# Patient Record
Sex: Male | Born: 1945 | Smoking: Current every day smoker
Health system: Southern US, Community
[De-identification: ages and names within clinical notes are randomized; demographics above are authoritative.]

---

## 2017-01-10 ENCOUNTER — Inpatient Hospital Stay (HOSPITAL_COMMUNITY): Payer: Medicare Other

## 2017-01-10 ENCOUNTER — Emergency Department (HOSPITAL_COMMUNITY): Payer: Medicare Other

## 2017-01-10 ENCOUNTER — Inpatient Hospital Stay (HOSPITAL_COMMUNITY)
Admission: EM | Admit: 2017-01-10 | Discharge: 2017-02-19 | DRG: 853 | Disposition: E | Payer: Medicare Other | Attending: Family Medicine | Admitting: Family Medicine

## 2017-01-10 DIAGNOSIS — E860 Dehydration: Secondary | ICD-10-CM | POA: Diagnosis present

## 2017-01-10 DIAGNOSIS — R042 Hemoptysis: Secondary | ICD-10-CM | POA: Diagnosis not present

## 2017-01-10 DIAGNOSIS — J939 Pneumothorax, unspecified: Secondary | ICD-10-CM

## 2017-01-10 DIAGNOSIS — R29810 Facial weakness: Secondary | ICD-10-CM | POA: Diagnosis present

## 2017-01-10 DIAGNOSIS — G8191 Hemiplegia, unspecified affecting right dominant side: Secondary | ICD-10-CM | POA: Diagnosis present

## 2017-01-10 DIAGNOSIS — Z4682 Encounter for fitting and adjustment of non-vascular catheter: Secondary | ICD-10-CM

## 2017-01-10 DIAGNOSIS — R54 Age-related physical debility: Secondary | ICD-10-CM | POA: Diagnosis not present

## 2017-01-10 DIAGNOSIS — R402234 Coma scale, best verbal response, inappropriate words, 24 hours or more after hospital admission: Secondary | ICD-10-CM

## 2017-01-10 DIAGNOSIS — L89152 Pressure ulcer of sacral region, stage 2: Secondary | ICD-10-CM | POA: Diagnosis not present

## 2017-01-10 DIAGNOSIS — I21A1 Myocardial infarction type 2: Secondary | ICD-10-CM | POA: Diagnosis not present

## 2017-01-10 DIAGNOSIS — R64 Cachexia: Secondary | ICD-10-CM | POA: Diagnosis present

## 2017-01-10 DIAGNOSIS — Z87820 Personal history of traumatic brain injury: Secondary | ICD-10-CM

## 2017-01-10 DIAGNOSIS — R402114 Coma scale, eyes open, never, 24 hours or more after hospital admission: Secondary | ICD-10-CM | POA: Diagnosis not present

## 2017-01-10 DIAGNOSIS — R402362 Coma scale, best motor response, obeys commands, at arrival to emergency department: Secondary | ICD-10-CM | POA: Diagnosis present

## 2017-01-10 DIAGNOSIS — Z681 Body mass index (BMI) 19 or less, adult: Secondary | ICD-10-CM

## 2017-01-10 DIAGNOSIS — J189 Pneumonia, unspecified organism: Secondary | ICD-10-CM | POA: Diagnosis not present

## 2017-01-10 DIAGNOSIS — R4701 Aphasia: Secondary | ICD-10-CM

## 2017-01-10 DIAGNOSIS — Z9689 Presence of other specified functional implants: Secondary | ICD-10-CM | POA: Diagnosis not present

## 2017-01-10 DIAGNOSIS — J942 Hemothorax: Secondary | ICD-10-CM | POA: Diagnosis present

## 2017-01-10 DIAGNOSIS — Z23 Encounter for immunization: Secondary | ICD-10-CM

## 2017-01-10 DIAGNOSIS — J90 Pleural effusion, not elsewhere classified: Secondary | ICD-10-CM

## 2017-01-10 DIAGNOSIS — I519 Heart disease, unspecified: Secondary | ICD-10-CM | POA: Diagnosis not present

## 2017-01-10 DIAGNOSIS — G92 Toxic encephalopathy: Secondary | ICD-10-CM | POA: Diagnosis not present

## 2017-01-10 DIAGNOSIS — I130 Hypertensive heart and chronic kidney disease with heart failure and stage 1 through stage 4 chronic kidney disease, or unspecified chronic kidney disease: Secondary | ICD-10-CM | POA: Diagnosis present

## 2017-01-10 DIAGNOSIS — D6489 Other specified anemias: Secondary | ICD-10-CM | POA: Diagnosis present

## 2017-01-10 DIAGNOSIS — R402142 Coma scale, eyes open, spontaneous, at arrival to emergency department: Secondary | ICD-10-CM | POA: Diagnosis present

## 2017-01-10 DIAGNOSIS — E872 Acidosis: Secondary | ICD-10-CM | POA: Diagnosis present

## 2017-01-10 DIAGNOSIS — Z7189 Other specified counseling: Secondary | ICD-10-CM | POA: Diagnosis not present

## 2017-01-10 DIAGNOSIS — L0291 Cutaneous abscess, unspecified: Secondary | ICD-10-CM | POA: Diagnosis not present

## 2017-01-10 DIAGNOSIS — K047 Periapical abscess without sinus: Secondary | ICD-10-CM

## 2017-01-10 DIAGNOSIS — R402344 Coma scale, best motor response, flexion withdrawal, 24 hours or more after hospital admission: Secondary | ICD-10-CM | POA: Diagnosis not present

## 2017-01-10 DIAGNOSIS — N179 Acute kidney failure, unspecified: Secondary | ICD-10-CM | POA: Diagnosis present

## 2017-01-10 DIAGNOSIS — E870 Hyperosmolality and hypernatremia: Secondary | ICD-10-CM | POA: Diagnosis present

## 2017-01-10 DIAGNOSIS — I639 Cerebral infarction, unspecified: Secondary | ICD-10-CM | POA: Diagnosis not present

## 2017-01-10 DIAGNOSIS — R188 Other ascites: Secondary | ICD-10-CM | POA: Diagnosis not present

## 2017-01-10 DIAGNOSIS — I5032 Chronic diastolic (congestive) heart failure: Secondary | ICD-10-CM | POA: Diagnosis present

## 2017-01-10 DIAGNOSIS — E876 Hypokalemia: Secondary | ICD-10-CM | POA: Diagnosis not present

## 2017-01-10 DIAGNOSIS — R29710 NIHSS score 10: Secondary | ICD-10-CM | POA: Diagnosis present

## 2017-01-10 DIAGNOSIS — R4182 Altered mental status, unspecified: Secondary | ICD-10-CM | POA: Diagnosis present

## 2017-01-10 DIAGNOSIS — R1310 Dysphagia, unspecified: Secondary | ICD-10-CM | POA: Diagnosis not present

## 2017-01-10 DIAGNOSIS — G9341 Metabolic encephalopathy: Secondary | ICD-10-CM | POA: Diagnosis present

## 2017-01-10 DIAGNOSIS — K045 Chronic apical periodontitis: Secondary | ICD-10-CM | POA: Diagnosis present

## 2017-01-10 DIAGNOSIS — IMO0002 Reserved for concepts with insufficient information to code with codable children: Secondary | ICD-10-CM

## 2017-01-10 DIAGNOSIS — E878 Other disorders of electrolyte and fluid balance, not elsewhere classified: Secondary | ICD-10-CM | POA: Diagnosis present

## 2017-01-10 DIAGNOSIS — J869 Pyothorax without fistula: Secondary | ICD-10-CM | POA: Diagnosis present

## 2017-01-10 DIAGNOSIS — I700 Atherosclerosis of aorta: Secondary | ICD-10-CM | POA: Diagnosis present

## 2017-01-10 DIAGNOSIS — R402242 Coma scale, best verbal response, confused conversation, at arrival to emergency department: Secondary | ICD-10-CM

## 2017-01-10 DIAGNOSIS — Z515 Encounter for palliative care: Secondary | ICD-10-CM | POA: Diagnosis not present

## 2017-01-10 DIAGNOSIS — E43 Unspecified severe protein-calorie malnutrition: Secondary | ICD-10-CM | POA: Diagnosis not present

## 2017-01-10 DIAGNOSIS — I10 Essential (primary) hypertension: Secondary | ICD-10-CM

## 2017-01-10 DIAGNOSIS — J860 Pyothorax with fistula: Secondary | ICD-10-CM | POA: Diagnosis present

## 2017-01-10 DIAGNOSIS — R7881 Bacteremia: Secondary | ICD-10-CM | POA: Diagnosis not present

## 2017-01-10 DIAGNOSIS — E162 Hypoglycemia, unspecified: Secondary | ICD-10-CM | POA: Diagnosis not present

## 2017-01-10 DIAGNOSIS — K083 Retained dental root: Secondary | ICD-10-CM | POA: Diagnosis present

## 2017-01-10 DIAGNOSIS — I2510 Atherosclerotic heart disease of native coronary artery without angina pectoris: Secondary | ICD-10-CM | POA: Diagnosis present

## 2017-01-10 DIAGNOSIS — J439 Emphysema, unspecified: Secondary | ICD-10-CM | POA: Diagnosis present

## 2017-01-10 DIAGNOSIS — J690 Pneumonitis due to inhalation of food and vomit: Secondary | ICD-10-CM | POA: Diagnosis not present

## 2017-01-10 DIAGNOSIS — R0602 Shortness of breath: Secondary | ICD-10-CM

## 2017-01-10 DIAGNOSIS — Z978 Presence of other specified devices: Secondary | ICD-10-CM

## 2017-01-10 DIAGNOSIS — I6300 Cerebral infarction due to thrombosis of unspecified precerebral artery: Secondary | ICD-10-CM | POA: Diagnosis not present

## 2017-01-10 DIAGNOSIS — J9600 Acute respiratory failure, unspecified whether with hypoxia or hypercapnia: Secondary | ICD-10-CM

## 2017-01-10 DIAGNOSIS — R6521 Severe sepsis with septic shock: Secondary | ICD-10-CM | POA: Diagnosis present

## 2017-01-10 DIAGNOSIS — R1312 Dysphagia, oropharyngeal phase: Secondary | ICD-10-CM | POA: Diagnosis not present

## 2017-01-10 DIAGNOSIS — R0902 Hypoxemia: Secondary | ICD-10-CM

## 2017-01-10 DIAGNOSIS — A419 Sepsis, unspecified organism: Principal | ICD-10-CM | POA: Diagnosis present

## 2017-01-10 DIAGNOSIS — J948 Other specified pleural conditions: Secondary | ICD-10-CM | POA: Diagnosis present

## 2017-01-10 DIAGNOSIS — R531 Weakness: Secondary | ICD-10-CM

## 2017-01-10 DIAGNOSIS — I7090 Unspecified atherosclerosis: Secondary | ICD-10-CM | POA: Diagnosis not present

## 2017-01-10 DIAGNOSIS — Z7401 Bed confinement status: Secondary | ICD-10-CM

## 2017-01-10 DIAGNOSIS — Z789 Other specified health status: Secondary | ICD-10-CM | POA: Diagnosis not present

## 2017-01-10 DIAGNOSIS — Z66 Do not resuscitate: Secondary | ICD-10-CM | POA: Diagnosis not present

## 2017-01-10 DIAGNOSIS — J9601 Acute respiratory failure with hypoxia: Secondary | ICD-10-CM | POA: Diagnosis not present

## 2017-01-10 DIAGNOSIS — R229 Localized swelling, mass and lump, unspecified: Secondary | ICD-10-CM

## 2017-01-10 DIAGNOSIS — R739 Hyperglycemia, unspecified: Secondary | ICD-10-CM | POA: Diagnosis present

## 2017-01-10 DIAGNOSIS — Z781 Physical restraint status: Secondary | ICD-10-CM

## 2017-01-10 DIAGNOSIS — Z4659 Encounter for fitting and adjustment of other gastrointestinal appliance and device: Secondary | ICD-10-CM | POA: Diagnosis not present

## 2017-01-10 DIAGNOSIS — E440 Moderate protein-calorie malnutrition: Secondary | ICD-10-CM | POA: Diagnosis not present

## 2017-01-10 DIAGNOSIS — F0390 Unspecified dementia without behavioral disturbance: Secondary | ICD-10-CM | POA: Diagnosis present

## 2017-01-10 DIAGNOSIS — R748 Abnormal levels of other serum enzymes: Secondary | ICD-10-CM | POA: Diagnosis not present

## 2017-01-10 DIAGNOSIS — E861 Hypovolemia: Secondary | ICD-10-CM | POA: Diagnosis not present

## 2017-01-10 DIAGNOSIS — Z452 Encounter for adjustment and management of vascular access device: Secondary | ICD-10-CM

## 2017-01-10 DIAGNOSIS — D6959 Other secondary thrombocytopenia: Secondary | ICD-10-CM | POA: Diagnosis not present

## 2017-01-10 DIAGNOSIS — F17210 Nicotine dependence, cigarettes, uncomplicated: Secondary | ICD-10-CM | POA: Diagnosis present

## 2017-01-10 DIAGNOSIS — N183 Chronic kidney disease, stage 3 (moderate): Secondary | ICD-10-CM | POA: Diagnosis present

## 2017-01-10 DIAGNOSIS — L89159 Pressure ulcer of sacral region, unspecified stage: Secondary | ICD-10-CM | POA: Diagnosis present

## 2017-01-10 DIAGNOSIS — J154 Pneumonia due to other streptococci: Secondary | ICD-10-CM | POA: Diagnosis not present

## 2017-01-10 DIAGNOSIS — R402252 Coma scale, best verbal response, oriented, at arrival to emergency department: Secondary | ICD-10-CM | POA: Diagnosis present

## 2017-01-10 LAB — COMPREHENSIVE METABOLIC PANEL
ALT: 46 U/L (ref 17–63)
AST: 79 U/L — AB (ref 15–41)
Albumin: 2 g/dL — ABNORMAL LOW (ref 3.5–5.0)
Alkaline Phosphatase: 75 U/L (ref 38–126)
Anion gap: 14 (ref 5–15)
BUN: 39 mg/dL — AB (ref 6–20)
CHLORIDE: 113 mmol/L — AB (ref 101–111)
CO2: 19 mmol/L — ABNORMAL LOW (ref 22–32)
CREATININE: 1.45 mg/dL — AB (ref 0.61–1.24)
Calcium: 8.3 mg/dL — ABNORMAL LOW (ref 8.9–10.3)
GFR calc Af Amer: 55 mL/min — ABNORMAL LOW (ref >60)
GFR, EST NON AFRICAN AMERICAN: 48 mL/min — AB (ref >60)
Glucose, Bld: 213 mg/dL — ABNORMAL HIGH (ref 65–99)
Potassium: 4.3 mmol/L (ref 3.5–5.1)
Sodium: 146 mmol/L — ABNORMAL HIGH (ref 135–145)
Total Bilirubin: 2 mg/dL — ABNORMAL HIGH (ref 0.3–1.2)
Total Protein: 6.7 g/dL (ref 6.5–8.1)

## 2017-01-10 LAB — I-STAT CHEM 8, ED
BUN: 38 mg/dL — AB (ref 6–20)
CHLORIDE: 115 mmol/L — AB (ref 101–111)
Calcium, Ion: 1.04 mmol/L — ABNORMAL LOW (ref 1.15–1.40)
Creatinine, Ser: 1.3 mg/dL — ABNORMAL HIGH (ref 0.61–1.24)
Glucose, Bld: 205 mg/dL — ABNORMAL HIGH (ref 65–99)
HCT: 38 % — ABNORMAL LOW (ref 39.0–52.0)
Hemoglobin: 12.9 g/dL — ABNORMAL LOW (ref 13.0–17.0)
POTASSIUM: 4.2 mmol/L (ref 3.5–5.1)
SODIUM: 149 mmol/L — AB (ref 135–145)
TCO2: 18 mmol/L (ref 0–100)

## 2017-01-10 LAB — RAPID URINE DRUG SCREEN, HOSP PERFORMED
AMPHETAMINES: NOT DETECTED
BARBITURATES: NOT DETECTED
BENZODIAZEPINES: NOT DETECTED
Cocaine: NOT DETECTED
Opiates: NOT DETECTED
Tetrahydrocannabinol: NOT DETECTED

## 2017-01-10 LAB — CBC
HCT: 36.4 % — ABNORMAL LOW (ref 39.0–52.0)
Hemoglobin: 11.2 g/dL — ABNORMAL LOW (ref 13.0–17.0)
MCH: 26.2 pg (ref 26.0–34.0)
MCHC: 30.8 g/dL (ref 30.0–36.0)
MCV: 85 fL (ref 78.0–100.0)
PLATELETS: 245 10*3/uL (ref 150–400)
RBC: 4.28 MIL/uL (ref 4.22–5.81)
RDW: 14.3 % (ref 11.5–15.5)
WBC: 8.8 10*3/uL (ref 4.0–10.5)

## 2017-01-10 LAB — DIFFERENTIAL
BASOS PCT: 0 %
BLASTS: 0 %
Band Neutrophils: 0 %
Basophils Absolute: 0 10*3/uL (ref 0.0–0.1)
EOS PCT: 0 %
Eosinophils Absolute: 0 10*3/uL (ref 0.0–0.7)
LYMPHS PCT: 5 %
Lymphs Abs: 0.4 10*3/uL — ABNORMAL LOW (ref 0.7–4.0)
MONO ABS: 0.6 10*3/uL (ref 0.1–1.0)
MONOS PCT: 7 %
Metamyelocytes Relative: 0 %
Myelocytes: 0 %
NEUTROS PCT: 88 %
NRBC: 0 /100{WBCs}
Neutro Abs: 7.8 10*3/uL — ABNORMAL HIGH (ref 1.7–7.7)
Other: 0 %
PROMYELOCYTES ABS: 0 %
WBC Morphology: INCREASED

## 2017-01-10 LAB — LIPID PANEL
Cholesterol: 44 mg/dL (ref 0–200)
TRIGLYCERIDES: 104 mg/dL (ref <150)
VLDL: 21 mg/dL (ref 0–40)

## 2017-01-10 LAB — ETHANOL

## 2017-01-10 LAB — URINALYSIS, MICROSCOPIC (REFLEX)

## 2017-01-10 LAB — URINALYSIS, ROUTINE W REFLEX MICROSCOPIC
BILIRUBIN URINE: NEGATIVE
Glucose, UA: NEGATIVE mg/dL
KETONES UR: NEGATIVE mg/dL
Leukocytes, UA: NEGATIVE
Nitrite: NEGATIVE
PROTEIN: 30 mg/dL — AB
Specific Gravity, Urine: 1.025 (ref 1.005–1.030)
pH: 6 (ref 5.0–8.0)

## 2017-01-10 LAB — BRAIN NATRIURETIC PEPTIDE: B Natriuretic Peptide: 503.9 pg/mL — ABNORMAL HIGH (ref 0.0–100.0)

## 2017-01-10 LAB — PHOSPHORUS: Phosphorus: 4.4 mg/dL (ref 2.5–4.6)

## 2017-01-10 LAB — PROTIME-INR
INR: 1.15
PROTHROMBIN TIME: 14.8 s (ref 11.4–15.2)

## 2017-01-10 LAB — MAGNESIUM: Magnesium: 2.2 mg/dL (ref 1.7–2.4)

## 2017-01-10 LAB — I-STAT TROPONIN, ED: TROPONIN I, POC: 0.07 ng/mL (ref 0.00–0.08)

## 2017-01-10 LAB — TSH: TSH: 0.898 u[IU]/mL (ref 0.350–4.500)

## 2017-01-10 LAB — TROPONIN I: TROPONIN I: 0.03 ng/mL — AB (ref <0.03)

## 2017-01-10 LAB — APTT: APTT: 26 s (ref 24–36)

## 2017-01-10 MED ORDER — FOLIC ACID 5 MG/ML IJ SOLN
1.0000 mg | Freq: Every day | INTRAMUSCULAR | Status: DC
  Administered 2017-01-10 – 2017-01-12 (×3): 1 mg via INTRAVENOUS
  Filled 2017-01-10 (×5): qty 0.2

## 2017-01-10 MED ORDER — SODIUM CHLORIDE 0.9 % IV SOLN
INTRAVENOUS | Status: DC
  Administered 2017-01-10: 23:00:00 via INTRAVENOUS

## 2017-01-10 MED ORDER — THIAMINE HCL 100 MG/ML IJ SOLN
100.0000 mg | Freq: Every day | INTRAMUSCULAR | Status: DC
  Administered 2017-01-10 – 2017-01-12 (×3): 100 mg via INTRAVENOUS
  Filled 2017-01-10 (×2): qty 1
  Filled 2017-01-10: qty 2

## 2017-01-10 MED ORDER — PANTOPRAZOLE SODIUM 40 MG IV SOLR
40.0000 mg | Freq: Two times a day (BID) | INTRAVENOUS | Status: DC
  Administered 2017-01-10 – 2017-01-12 (×4): 40 mg via INTRAVENOUS
  Filled 2017-01-10 (×4): qty 40

## 2017-01-10 MED ORDER — SODIUM CHLORIDE 0.9% FLUSH
3.0000 mL | Freq: Two times a day (BID) | INTRAVENOUS | Status: DC
Start: 2017-01-10 — End: 2017-01-21
  Administered 2017-01-10 – 2017-01-21 (×16): 3 mL via INTRAVENOUS

## 2017-01-10 NOTE — ED Triage Notes (Signed)
Pt. Coming from hotel room where he lives with his nephew for right facial droop and right sided weakness. LKN last night before bed. Pt. Nephew left for work at [id number] and patient was asleep. Returned from work and patient unable to get up and not acting right. At baseline patient Aox4 and walks independently. Family does not know patient medical hx. Pt. Alert to self only, but does follow commands.

## 2017-01-10 NOTE — ED Notes (Signed)
Admitting at bedside 

## 2017-01-10 NOTE — ED Notes (Signed)
Attempted report x1. 

## 2017-01-10 NOTE — H&P (Signed)
Family Medicine Teaching [facility] Admission History and Physical Service Pager: [phone]  Patient name: Jay Stephens Medical record number: [id number] Date of birth: [birth year] Age: 71 y.o. Gender: male  Primary Care Provider: No primary care provider on file. Consultants: none Code Status:   Chief Complaint: altered mental status  Assessment and Plan: Jay Stephens is a 71 y.o. male with unknown PMH presenting with apparent altered mental status, R facial droop, and generalized weakness  Altered Mental Status/R facial droop/Weakness: Vitals stable on admission with mild tachycardia to 108-111, blood pressure 136/97, slightly elevated respiratory rate in the low 20s with an O2 of 90% on room air. Physical exam showing cachectic male with severe deconditioning. Neurological exam showing aphasia with right-sided facial droop and a laterally deviated right eye, diffuse weakness but does move all extremities spontaneously, unable to perform more thorough neuro exam due to apparent altered mental status. Last seen normal by his nephew in pm of 7/22. Patient apparently aox4 and able to do all adls at baseline, although physical appearance does not suggest this. Patient with no known medical history at this point and nephew that dropped patient off the ED did not leave contact information. GCS 13 at current time. CT scan (-) for acute stroke, but is positive for partial craniotomy with gliosis in right temporal lobe. Patient with opacification of RML, RLL with slight mediastinal shift on 1 view cxr.CT chest confirms large loculated hydropneumothorax on right chest as well as nonvisualization right upper lobe bronchus. He endorses pain to this side on palpation as well as pain to the sternum. EKG with st elevation in v2, v3, v4, v5, and II.  Bmp obtained which showed sodium of 149, cl 115, gluc 205, bun 38, cr 1.3, ical 1.04. Cbc obtained which showed hgb 11.2, hct 36.4. UA obtained with 6-30 squams, few  bacteria, moderate hgb on dipstick (dirty catch). UDS and alcohol (-). Unclear if patient has altered mental status or if this is his actual baseline. Patient with multiple reason that he could have altered mental status ranging from undiagnosed stroke, hypoxia, infection, medications, seizure, electrolyte abnormality, thyroid abnormality. Based on presentation and labwork most of these possibilities are less unlikely. Patient with mostly normal electrolytes (slightly hypernatremic), blood glucose in 200s, Patient with ct scan negative for stroke, wbc of 8.8, UDS (-) for drugs. Unknown if patient takes any medications at home that can cause ams.  - admit to [facility], inpatient status, Dr. Jennette Kettle - Vitals signs per medsurg, pulse ox with vitals - ekg in am, echo in am - cbc, TSH, bmp, hgb in am of 7/24 - f/u mg, bnp, troponin I, tsh, phosphorus labs - will give B1, folic acid - Pantoprazole 40mg  iv - continue to monitor mental status - trend troponins - OT/PT/SLP - NPO until evaluated by speech  Right sided hydropneumothorax: CT chest confirms large loculated hydropneumothorax on right chest as well as nonvisualization right upper lobe bronchus concerning for obstruction. Given that patient is satting well on room air this is likely a chronic problem. Broken rib viewable on left side suggests possible traumitic in etiology but impossible to say due to quality of film. WBC and afebrile so unlikely to be a pneumonia.  Will consider additional therapy when additional imaging results. Discussed patient with ICU attending Dr. Molli Knock who felt that this is a CVTS case if patient is a candidate.  - monitor respiratory status - [facility] as needed to keep sats above 92% -Consider consulting cardiovascular thoracic surgery in  the morning  EKG changes: Patient with ekg in ed showing very mild elevation of st segment in numerous segments. Unclear if this is noise or a real value. Will repeat ekg in am. Patient will  likely need echo during this admission as well. If turns out to be true pericarditis possible etiology could be blunt force trauma to chest, given his tenderness to palpation in that area. Will be able to better characterize sternum on additional imaging. Patient asymptomatic with good vital signs and no acute distress. - Continue to monitor hemodynamic stability - ekg in am - echo in am - Additional imaging to better characterize sternum - troponins  Electrolyte abnormalities; Hypernatremia, hyperchloremia: Likely from dehydration.  - Monitor for s/s of hypernatremia and hyperchloremia - Daily BMP - NaCl 75 mL/hr  Hypocalcemia Patient with low ionized calcium. Can replete with calcium gluconate if needed, although will opt to just watch for now. Serum calcium low at 8.8 but is 9.9 when corrected for low albumin.  - daily bmp   Protein Calorie Malnutrition  Patient with albumin of 2.0, marked temporal wasting, weight of 100lbs. Patient very thin and very frail looking on exam. Will need protein supplementation. IV fluids for now given NPO status. - Nutrition consult - fluids as above - advance diet when passes swallow  Hyperuremia Patient with bun of 38. Most likely due to muscle wasting  - will continue to watch - daily bmp - fluids as above  Hyperglycemia Glucose of 213 on admission. Unknown medical history. Will obtain a1c to screen for admission.  Elevated Transaminases Elevated AST up to 79. Patient with unknown history of alcohol use or any liver disorder.  -Will continue to trend  ? Tooth abscess: Poor dentition with periapical abscess around a left maxillary tooth (likely tooth number 11) seen on head CT.  -we'll hold off on antibiotics for now -Can consider starting antibiotics tomorrow   PMH is unknown  FEN/GI: npo Prophylaxis: pantoprazole, scds  Disposition: ???  History of Present Illness:  Jay Stephens is a 71 y.o. male presenting with altered mental  status for one day. Patient was last seen in his normal state of heath pm of 7/22. Patient has altered mental status so all medical history gleaned from ED physicians and nurse at bedside. Patient allegedly AOx4 and able to walk at baseline.  Patient found to be altered, confused by nephew this morning. He was unable to walk and was found to have right sided facial droop. At time of admission the patient's nephew cannot be contacted and is not in the emergency department. Additionally patient was noted to have a half eaten hot dog in his mouth upon ED presentation per RN. Patient apparently lives in a hotel with his nephew.   Patient brought to the ed with current workup as follows. Ct scan of the head revealed no stroke. Did have an area of encephalomalacia/gliosis in the right temporal lobe. Poor dentition with periapical abscess around tooth 11. BMP was obtained which showed sodium 149 (up from 146 at admission). Elevated chloride of 115, glucose of 205, bun 38, creatinine of 1.30 (down from 1.45). A cbc was obtained which showed a hemoglobin of 11.2 and 36.4 (up to 12.9/38 at recheck). An ekg was obtained which showed st segment elevation in numerous segments. A chest xray was obtained which showed an opacification in RML, RLL. An exam patient is able to move all four extremities. He is able to follow simple commands with some prompting. He  can speak simple words that are appropriate. GCS 13 at time of exam.  Review Of Systems: Per HPI with the following additions:   Review of Systems  Unable to perform ROS: Mental status change    There are no active problems to display for this patient.   Past Medical History: Unknown- Altered Mental Status  Past Surgical History: Unknown- altered mental status  Social History: Social History  Substance Use Topics  . Smoking status: Not on file  . Smokeless tobacco: Not on file  . Alcohol use Not on file   Additional social history: unknown  Please  also refer to relevant sections of EMR.  Family History: No family history on file. unknown  Allergies and Medications: No Known Allergies No current facility-administered medications on file prior to encounter.    No current outpatient prescriptions on file prior to encounter.    Objective: BP (!) 168/109 (BP Location: Right Arm)   Pulse (!) 108   Temp 98.9 F (37.2 C)   Resp (!) 24   Ht 5\' 10"  (1.778 m)   Wt 100 lb (45.4 kg)   SpO2 95%   BMI 14.35 kg/m  Exam: General: frail, thin appearing elderly male, alert, able to communicate with 1 word answers, GCS 13, able to follow commands, able to move all four extremities, eyes ope spontaneously. Eyes: cataract right eye, Right eye deviated laterally, PERRL, EOMI ENTM: temporal wasting B, no signs of trauma on nose, or bilateral ears Neck: supple, no adenopathy Cardiovascular: regular rate, no murmurs, rubs, gallops appreciated Respiratory: Coarse breath sounds on left lung, unable to appreciate breath sounds on right lung. Answered "yes" to pain when palpated right ribs and sternum. Gastrointestinal: soft, non-tender, non-distended. +BS MSK: able to move all four extremities at will, very thin appearing. No tenderness to palpation BLE, BUE Vascular: Unable to palpate bilateral LE pulses. Biphasic dp/pt bilaterally by doppler. Palpable radial pulse bilaterally. Derm: warm, dry. Multiple small wound noted on bilateral lower extremities Neuro: Alert, most non-verbal but can respond yes or no. Unable to assess CN due to poor cooperation. aphasia with right-sided facial droop and a laterally deviated right eye, diffuse weakness but does move all extremities spontaneously but not on command.  Psych: unable to assess  Labs and Imaging: CBC BMET   Recent Labs Lab 01/14/2017 1618 12/30/2016 1715  WBC 8.8  --   HGB 11.2* 12.9*  HCT 36.4* 38.0*  PLT 245  --     Recent Labs Lab 01/13/2017 1618 01/18/2017 1715  NA 146* 149*  K 4.3 4.2   CL 113* 115*  CO2 19*  --   BUN 39* 38*  CREATININE 1.45* 1.30*  GLUCOSE 213* 205*  CALCIUM 8.3*  --      Dg Chest 2 View  Result Date: 01/04/2017 CLINICAL DATA:  Hydropneumothorax.  Shortness of breath. EXAM: CHEST  2 VIEW COMPARISON:  Chest CT performed immediately prior. FINDINGS: Right hydropneumothorax, with fluid level demonstrated on the lateral view. There is leftward mediastinal shift. Mediastinal contours are unchanged, right heart border obscured by right lung opacity. No left pneumothorax. No pulmonary edema. Fracture of left lateral fourth ribs is again seen. IMPRESSION: Large right hydropneumothorax with leftward mediastinal shift. Please reference concurrently performed chest CT for more detailed evaluation. Electronically Signed   By: Rubye OaksMelanie  Ehinger M.D.   On: 01/06/2017 20:53   Ct Head Wo Contrast  Result Date: 12/31/2016 CLINICAL DATA:  71 year old male with history of altered mental status and facial droop.  EXAM: CT HEAD WITHOUT CONTRAST TECHNIQUE: Contiguous axial images were obtained from the base of the skull through the vertex without intravenous contrast. COMPARISON:  None. FINDINGS: Brain: Mild cerebral atrophy. Well-defined area of low attenuation and volume loss in the anterior aspect of the right temporal lobe, compatible with encephalomalacia/gliosis. Physiologic calcifications in the basal ganglia bilaterally. No evidence of acute infarction, hemorrhage, hydrocephalus, extra-axial collection or mass lesion/mass effect. Vascular: No hyperdense vessel or unexpected calcification. Skull: Postoperative changes of prior right pterional craniectomy. Sinuses/Orbits: Multifocal mucoperiosteal thickening in the maxillary sinuses bilaterally. Other: Poor dentition with multiple missing teeth, and periapical abscess associated with one of the left maxillary teeth (likely tooth number 11). IMPRESSION: 1. No acute intracranial abnormalities. 2. 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 3. Mild cerebral atrophy. 4. Changes of chronic sinusitis in the maxillary sinuses bilaterally. 5. Poor dentition with periapical abscess around a left maxillary tooth (likely tooth number 11). Electronically Signed   By: Trudie Reed M.D.   On: 01/01/2017 17:03   Ct Chest Wo Contrast  Result Date: 01/17/2017 CLINICAL DATA:  Patient came in with AMS today. CXR showed opacified right lung EXAM: CT CHEST WITHOUT CONTRAST TECHNIQUE: Multidetector CT imaging of the chest was performed following the standard protocol without IV contrast. COMPARISON:  None. FINDINGS: Cardiovascular: Extensive coronary calcifications. Heart size normal. No significant pericardial effusion. Mediastinum/Nodes: Leftward mediastinal shift. Limited assessment for adenopathy due to adjacent opacities and lack of IV contrast. Lungs/Pleura: Large loculated right pleural effusion. The dominant component is posteriorly at the base, extending across midline. This extends along the posteromedial aspect of the pleural space to the apex with a large anterolateral component at the apex containing gas/fluid level. There is marked atelectasis of the right long with only trivial aerated segment in the lower lobe. Right upper lobe bronchus is occluded proximally. It is difficult to exclude a central mass. Left lung unremarkable.  No left effusion. Upper Abdomen: No acute findings. Musculoskeletal: Minimal spondylitic changes in the visualized lower cervical spine. Old fracture deformity of the left fourth rib. No acute fracture or worrisome bone lesion. IMPRESSION: 1. Large right loculated hydropneumothorax, with atelectatic right lung and leftward mediastinal shift. 2. Nonvisualization of right upper lobe bronchus suggesting central occluding lesion. Follow-up recommended. 3. Coronary calcifications. Electronically Signed   By: Corlis Leak M.D.   On: 12/30/2016 21:00   Dg  Chest Portable 1 View  Result Date: 12/28/2016 CLINICAL DATA:  ams, facial droop. non verbal EXAM: PORTABLE CHEST - 1 VIEW COMPARISON:  none FINDINGS: There is near complete opacification of the right hemithorax. Lucency towards the apex but no definite lung markings suggesting possible layering hydropneumothorax. There is mediastinal shift from right to left suggesting a degree of mass effect or tension. Heart size and mediastinal contours are within normal limits. Left lungs clear. Heart size difficult to assess due to adjacent opacity. Fracture the lateral aspect left third rib, age indeterminate. Fixation hardware projects over the mandible. IMPRESSION: 1. Opacification of the RIGHT hemithorax with some mass effect on the mediastinum. Can't exclude layering hydropneumothorax. Critical Value/emergent results were called by telephone at the time of interpretation on 12/22/2016 at 4:54 pm to Dr. Benjiman Core , who verbally acknowledged these results. 2. LEFT third rib fracture, age indeterminate. Electronically Signed   By: Corlis Leak M.D.   On: 01/09/2017 16:55     Myrene Buddy, MD 01/15/2017, 6:15 PM PGY-1, [facility] Family Medicine FPTS Intern pager: [phone], text  pages welcome  I have separately seen and examined the patient. I have discussed the findings and exam with Dr Primitivo Gauze and agree with the above note.  My changes/additions are outlined in BLUE.   Anders Simmonds, MD [facility] Family Medicine, PGY-3

## 2017-01-10 NOTE — Progress Notes (Signed)
Arrived from ED. Awake and oriented to person. Minimally verbal. Follow command intermittently.

## 2017-01-10 NOTE — ED Provider Notes (Signed)
[facility]-EMERGENCY DEPT Provider Note   CSN: [id number] Arrival date & time: January 27, 2017  1559     History   Chief Complaint Chief Complaint  Patient presents with  . Extremity Weakness  . Facial Droop    HPI Jay Stephens is a 71 y.o. male. Level V caveat due to mental status changes. HPI Patient comes in from the hotel that he lives in. Reportedly was normal last night when he went to bed. Patient confused this evening when his nephew got home from work. Reportedly alert and oriented at baseline and walks independently. Patient unable to provide history. No past medical history on file.  There are no active problems to display for this patient.   No past surgical history on file.     Home Medications    Prior to Admission medications   Not on File    Family History No family history on file.  Social History Social History  Substance Use Topics  . Smoking status: Not on file  . Smokeless tobacco: Not on file  . Alcohol use Not on file     Allergies   Patient has no known allergies.   Review of Systems Review of Systems  Unable to perform ROS: Mental status change     Physical Exam Updated Vital Signs BP (!) 168/109 (BP Location: Right Arm)   Pulse (!) 108   Temp 98.9 F (37.2 C)   Resp (!) 24   Ht 5\' 10"  (1.778 m)   Wt 45.4 kg (100 lb)   SpO2 95%   BMI 14.35 kg/m   Physical Exam  Constitutional: He appears well-developed.  HENT:  Right-sided facial droop. Tongue also possibly deviated right.  Eyes:  Likely cataract on right side. Right eye is deviated somewhat laterally but does have some movements intact. Left pupil reactive. Eye movements so there may be some difficulty with the left eye going medially also.  Neck: Neck supple.  Cardiovascular: Normal rate.   Pulmonary/Chest:  Decreased breath sounds on right  Abdominal: There is no tenderness.  Musculoskeletal: He exhibits no edema.  Neurological:  Awake and will follow some commands but  minimally verbal and cannot really participate in history. Will grip bilaterally. Decreased strength in both lower extremities. Minimal speech. Face asymmetric. Some abnormalities or eye movement.  Skin: Skin is warm.     ED Treatments / Results  Labs (all labs ordered are listed, but only abnormal results are displayed) Labs Reviewed  CBC - Abnormal; Notable for the following:       Result Value   Hemoglobin 11.2 (*)    HCT 36.4 (*)    All other components within normal limits  URINALYSIS, ROUTINE W REFLEX MICROSCOPIC - Abnormal; Notable for the following:    Color, Urine AMBER (*)    APPearance HAZY (*)    Hgb urine dipstick MODERATE (*)    Protein, ur 30 (*)    All other components within normal limits  URINALYSIS, MICROSCOPIC (REFLEX) - Abnormal; Notable for the following:    Bacteria, UA FEW (*)    Squamous Epithelial / LPF 6-30 (*)    All other components within normal limits  I-STAT CHEM 8, ED - Abnormal; Notable for the following:    Sodium 149 (*)    Chloride 115 (*)    BUN 38 (*)    Creatinine, Ser 1.30 (*)    Glucose, Bld 205 (*)    Calcium, Ion 1.04 (*)    Hemoglobin 12.9 (*)  HCT 38.0 (*)    All other components within normal limits  PROTIME-INR  APTT  DIFFERENTIAL  ETHANOL  COMPREHENSIVE METABOLIC PANEL  RAPID URINE DRUG SCREEN, HOSP PERFORMED  I-STAT TROPONIN, ED    EKG  EKG Interpretation None       Radiology Ct Head Wo Contrast  Result Date: 12/31/2016 CLINICAL DATA:  71 year old male with history of altered mental status and facial droop. EXAM: CT HEAD WITHOUT CONTRAST TECHNIQUE: Contiguous axial images were obtained from the base of the skull through the vertex without intravenous contrast. COMPARISON:  None. FINDINGS: Brain: Mild cerebral atrophy. Well-defined area of low attenuation and volume loss in the anterior aspect of the right temporal lobe, compatible with encephalomalacia/gliosis. Physiologic calcifications in the basal ganglia  bilaterally. No evidence of acute infarction, hemorrhage, hydrocephalus, extra-axial collection or mass lesion/mass effect. Vascular: No hyperdense vessel or unexpected calcification. Skull: Postoperative changes of prior right pterional craniectomy. Sinuses/Orbits: Multifocal mucoperiosteal thickening in the maxillary sinuses bilaterally. Other: Poor dentition with multiple missing teeth, and periapical abscess associated with one of the left maxillary teeth (likely tooth number 11). IMPRESSION: 1. No acute intracranial abnormalities. 2. Chronic area of encephalomalacia/gliosis in the right temporal lobe, likely postoperative. 3. Mild cerebral atrophy. 4. Changes of chronic sinusitis in the maxillary sinuses bilaterally. 5. Poor dentition with periapical abscess around a left maxillary tooth (likely tooth number 11). Electronically Signed   By: Trudie Reed M.D.   On: 01/02/2017 17:03   Dg Chest Portable 1 View  Result Date: 01/09/2017 CLINICAL DATA:  ams, facial droop. non verbal EXAM: PORTABLE CHEST - 1 VIEW COMPARISON:  none FINDINGS: There is near complete opacification of the right hemithorax. Lucency towards the apex but no definite lung markings suggesting possible layering hydropneumothorax. There is mediastinal shift from right to left suggesting a degree of mass effect or tension. Heart size and mediastinal contours are within normal limits. Left lungs clear. Heart size difficult to assess due to adjacent opacity. Fracture the lateral aspect left third rib, age indeterminate. Fixation hardware projects over the mandible. IMPRESSION: 1. Opacification of the RIGHT hemithorax with some mass effect on the mediastinum. Can't exclude layering hydropneumothorax. Critical Value/emergent results were called by telephone at the time of interpretation on 01/05/2017 at 4:54 pm to Dr. Benjiman Core , who verbally acknowledged these results. 2. LEFT third rib fracture, age indeterminate. Electronically Signed    By: Corlis Leak M.D.   On: 12/28/2016 16:55    Procedures Procedures (including critical care time)  Medications Ordered in ED Medications - No data to display   Initial Impression / Assessment and Plan / ED Course  I have reviewed the triage vital signs and the nursing notes.  Pertinent labs & imaging results that were available during my care of the patient were reviewed by me and considered in my medical decision making (see chart for details).     Patient brought in for weakness and mental status changes. Unsure of baseline or previous medical history. At baseline patient is reportedly ambulatory and verbal. He does look somewhat potentially atrophied in his legs however. Right-sided chest has potentially hydropneumothorax. Potentially mass. Head CT is potentially postsurgical but nothing seen is acute. Last normal was potentially last night. With unknown history and onset last night not a TPA candidate with these deficits. Patient will be admitted. Will need to attempt to determine the patient's history also.  Final Clinical Impressions(s) / ED Diagnoses   Final diagnoses:  Weakness  Altered mental status,  unspecified altered mental status type  Hydropneumothorax    New Prescriptions New Prescriptions   No medications on file     Benjiman CorePickering, Taishaun Levels, MD 01/04/2017 [phone]

## 2017-01-11 ENCOUNTER — Inpatient Hospital Stay (HOSPITAL_COMMUNITY): Payer: Medicare Other

## 2017-01-11 ENCOUNTER — Other Ambulatory Visit (HOSPITAL_COMMUNITY): Payer: Self-pay

## 2017-01-11 DIAGNOSIS — E43 Unspecified severe protein-calorie malnutrition: Secondary | ICD-10-CM | POA: Diagnosis present

## 2017-01-11 DIAGNOSIS — I6300 Cerebral infarction due to thrombosis of unspecified precerebral artery: Secondary | ICD-10-CM | POA: Diagnosis present

## 2017-01-11 DIAGNOSIS — J9600 Acute respiratory failure, unspecified whether with hypoxia or hypercapnia: Secondary | ICD-10-CM

## 2017-01-11 DIAGNOSIS — R531 Weakness: Secondary | ICD-10-CM

## 2017-01-11 DIAGNOSIS — J869 Pyothorax without fistula: Secondary | ICD-10-CM

## 2017-01-11 DIAGNOSIS — J9601 Acute respiratory failure with hypoxia: Secondary | ICD-10-CM

## 2017-01-11 DIAGNOSIS — R54 Age-related physical debility: Secondary | ICD-10-CM

## 2017-01-11 DIAGNOSIS — I639 Cerebral infarction, unspecified: Secondary | ICD-10-CM

## 2017-01-11 LAB — GLUCOSE, CAPILLARY
GLUCOSE-CAPILLARY: 175 mg/dL — AB (ref 65–99)
Glucose-Capillary: 184 mg/dL — ABNORMAL HIGH (ref 65–99)
Glucose-Capillary: 137 mg/dL — ABNORMAL HIGH (ref 65–99)
Glucose-Capillary: 71 mg/dL (ref 65–99)
Glucose-Capillary: 95 mg/dL (ref 65–99)

## 2017-01-11 LAB — CBC
HEMATOCRIT: 36.4 % — AB (ref 39.0–52.0)
HEMOGLOBIN: 11.6 g/dL — AB (ref 13.0–17.0)
MCH: 27 pg (ref 26.0–34.0)
MCHC: 31.9 g/dL (ref 30.0–36.0)
MCV: 84.7 fL (ref 78.0–100.0)
Platelets: 197 10*3/uL (ref 150–400)
RBC: 4.3 MIL/uL (ref 4.22–5.81)
RDW: 14.3 % (ref 11.5–15.5)
WBC: 13.5 10*3/uL — AB (ref 4.0–10.5)

## 2017-01-11 LAB — POCT I-STAT 3, ART BLOOD GAS (G3+)
ACID-BASE DEFICIT: 4 mmol/L — AB (ref 0.0–2.0)
Bicarbonate: 18.3 mmol/L — ABNORMAL LOW (ref 20.0–28.0)
O2 SAT: 100 %
PH ART: 7.479 — AB (ref 7.350–7.450)
PO2 ART: 227 mmHg — AB (ref 83.0–108.0)
TCO2: 19 mmol/L (ref 0–100)
pCO2 arterial: 24.7 mmHg — ABNORMAL LOW (ref 32.0–48.0)

## 2017-01-11 LAB — COMPREHENSIVE METABOLIC PANEL
ALBUMIN: 1.7 g/dL — AB (ref 3.5–5.0)
ALT: 53 U/L (ref 17–63)
ANION GAP: 15 (ref 5–15)
AST: 80 U/L — AB (ref 15–41)
Alkaline Phosphatase: 63 U/L (ref 38–126)
BILIRUBIN TOTAL: 1.9 mg/dL — AB (ref 0.3–1.2)
BUN: 50 mg/dL — AB (ref 6–20)
CHLORIDE: 121 mmol/L — AB (ref 101–111)
CO2: 13 mmol/L — AB (ref 22–32)
Calcium: 7.6 mg/dL — ABNORMAL LOW (ref 8.9–10.3)
Creatinine, Ser: 1.35 mg/dL — ABNORMAL HIGH (ref 0.61–1.24)
GFR calc Af Amer: >60 mL/min (ref >60)
GFR calc non Af Amer: 52 mL/min — ABNORMAL LOW (ref >60)
GLUCOSE: 186 mg/dL — AB (ref 65–99)
POTASSIUM: 4.8 mmol/L (ref 3.5–5.1)
SODIUM: 149 mmol/L — AB (ref 135–145)
TOTAL PROTEIN: 6 g/dL — AB (ref 6.5–8.1)

## 2017-01-11 LAB — MAGNESIUM: Magnesium: 1.9 mg/dL (ref 1.7–2.4)

## 2017-01-11 LAB — MRSA PCR SCREENING: MRSA BY PCR: NEGATIVE

## 2017-01-11 LAB — AMMONIA: AMMONIA: 92 umol/L — AB (ref 9–35)

## 2017-01-11 LAB — TROPONIN I: TROPONIN I: 0.03 ng/mL — AB (ref <0.03)

## 2017-01-11 LAB — TRIGLYCERIDES: Triglycerides: 90 mg/dL (ref <150)

## 2017-01-11 LAB — PHOSPHORUS: Phosphorus: 3.7 mg/dL (ref 2.5–4.6)

## 2017-01-11 LAB — LACTIC ACID, PLASMA
LACTIC ACID, VENOUS: 4.5 mmol/L — AB (ref 0.5–1.9)
LACTIC ACID, VENOUS: 2.6 mmol/L — AB (ref 0.5–1.9)

## 2017-01-11 MED ORDER — PIPERACILLIN-TAZOBACTAM 3.375 G IVPB
3.3750 g | Freq: Three times a day (TID) | INTRAVENOUS | Status: DC
Start: 2017-01-11 — End: 2017-01-15
  Administered 2017-01-11 – 2017-01-15 (×12): 3.375 g via INTRAVENOUS
  Filled 2017-01-11 (×13): qty 50

## 2017-01-11 MED ORDER — CHLORHEXIDINE GLUCONATE 0.12% ORAL RINSE (MEDLINE KIT)
15.0000 mL | Freq: Two times a day (BID) | OROMUCOSAL | Status: DC
  Administered 2017-01-11 – 2017-01-14 (×7): 15 mL via OROMUCOSAL

## 2017-01-11 MED ORDER — VITAL HIGH PROTEIN PO LIQD: 1000.0000 mL | ORAL | Status: DC

## 2017-01-11 MED ORDER — SODIUM BICARBONATE 650 MG PO TABS
650.0000 mg | ORAL_TABLET | Freq: Three times a day (TID) | ORAL | Status: DC
  Administered 2017-01-11 – 2017-01-12 (×3): 650 mg
  Filled 2017-01-11 (×4): qty 1

## 2017-01-11 MED ORDER — MIDAZOLAM HCL 2 MG/2ML IJ SOLN
INTRAMUSCULAR | Status: AC
  Filled 2017-01-11: qty 2

## 2017-01-11 MED ORDER — INSULIN ASPART 100 UNIT/ML ~~LOC~~ SOLN
0.0000 [IU] | SUBCUTANEOUS | Status: DC
Start: 2017-01-11 — End: 2017-01-15
  Administered 2017-01-12: 2 [IU] via SUBCUTANEOUS
  Administered 2017-01-12: 3 [IU] via SUBCUTANEOUS
  Administered 2017-01-12: 2 [IU] via SUBCUTANEOUS
  Administered 2017-01-12: 1 [IU] via SUBCUTANEOUS
  Administered 2017-01-13 (×3): 2 [IU] via SUBCUTANEOUS
  Administered 2017-01-13: 3 [IU] via SUBCUTANEOUS
  Administered 2017-01-14 (×5): 2 [IU] via SUBCUTANEOUS
  Administered 2017-01-14 – 2017-01-15 (×2): 1 [IU] via SUBCUTANEOUS

## 2017-01-11 MED ORDER — SODIUM CHLORIDE 0.9 % IV SOLN
0.0000 ug/min | INTRAVENOUS | Status: DC
  Administered 2017-01-11: 15 ug/min via INTRAVENOUS
  Administered 2017-01-12: 145 ug/min via INTRAVENOUS
  Filled 2017-01-11 (×3): qty 4

## 2017-01-11 MED ORDER — SODIUM CHLORIDE 0.9 % IV SOLN
1.5000 g | Freq: Four times a day (QID) | INTRAVENOUS | Status: DC
  Filled 2017-01-11: qty 1.5

## 2017-01-11 MED ORDER — PROPOFOL 1000 MG/100ML IV EMUL
5.0000 ug/kg/min | INTRAVENOUS | Status: DC
Start: 2017-01-11 — End: 2017-01-12
  Filled 2017-01-11: qty 100

## 2017-01-11 MED ORDER — FENTANYL CITRATE (PF) 100 MCG/2ML IJ SOLN
INTRAMUSCULAR | Status: AC
  Filled 2017-01-11: qty 2

## 2017-01-11 MED ORDER — SODIUM CHLORIDE 0.9 % IV BOLUS (SEPSIS)
500.0000 mL | Freq: Once | INTRAVENOUS | Status: AC
  Administered 2017-01-11: 500 mL via INTRAVENOUS

## 2017-01-11 MED ORDER — PROPOFOL 1000 MG/100ML IV EMUL
INTRAVENOUS | Status: AC
  Filled 2017-01-11: qty 100

## 2017-01-11 MED ORDER — VANCOMYCIN HCL 10 G IV SOLR
1750.0000 mg | Freq: Once | INTRAVENOUS | Status: AC
  Administered 2017-01-11: 1750 mg via INTRAVENOUS
  Filled 2017-01-11 (×2): qty 1750

## 2017-01-11 MED ORDER — ORAL CARE MOUTH RINSE
15.0000 mL | OROMUCOSAL | Status: DC
  Administered 2017-01-11 – 2017-01-14 (×27): 15 mL via OROMUCOSAL

## 2017-01-11 MED ORDER — MIDAZOLAM HCL 2 MG/2ML IJ SOLN
4.0000 mg | Freq: Once | INTRAMUSCULAR | Status: AC
  Administered 2017-01-11: 4 mg via INTRAVENOUS

## 2017-01-11 MED ORDER — SODIUM CHLORIDE 0.45 % IV SOLN
INTRAVENOUS | Status: DC
  Administered 2017-01-11 – 2017-01-12 (×2): via INTRAVENOUS

## 2017-01-11 MED ORDER — LIDOCAINE HCL (PF) 1 % IJ SOLN
INTRAMUSCULAR | Status: AC
  Filled 2017-01-11: qty 30

## 2017-01-11 MED ORDER — SODIUM CHLORIDE 0.9 % IV SOLN
Freq: Once | INTRAVENOUS | Status: AC
  Administered 2017-01-11: 10:00:00 via INTRAVENOUS

## 2017-01-11 MED ORDER — PRO-STAT SUGAR FREE PO LIQD
30.0000 mL | Freq: Two times a day (BID) | ORAL | Status: DC
  Filled 2017-01-11: qty 30

## 2017-01-11 MED ORDER — VITAL AF 1.2 CAL PO LIQD
1000.0000 mL | ORAL | Status: DC
  Administered 2017-01-11 – 2017-01-14 (×3): 1000 mL

## 2017-01-11 MED ORDER — SODIUM CHLORIDE 0.9 % IV BOLUS (SEPSIS)
1000.0000 mL | Freq: Once | INTRAVENOUS | Status: AC
  Administered 2017-01-11: 1000 mL via INTRAVENOUS

## 2017-01-11 MED ORDER — VANCOMYCIN HCL IN DEXTROSE 750-5 MG/150ML-% IV SOLN
750.0000 mg | Freq: Two times a day (BID) | INTRAVENOUS | Status: DC
  Administered 2017-01-11: 750 mg via INTRAVENOUS
  Filled 2017-01-11 (×2): qty 150

## 2017-01-11 MED ORDER — SODIUM CHLORIDE 0.9 % IV SOLN
INTRAVENOUS | Status: DC
  Administered 2017-01-11: 16:00:00 via INTRAVENOUS

## 2017-01-11 MED ORDER — PROPOFOL 1000 MG/100ML IV EMUL: 0.0000 ug/kg/min | INTRAVENOUS | Status: DC

## 2017-01-11 MED ORDER — ETOMIDATE 2 MG/ML IV SOLN
0.3000 mg/kg | Freq: Once | INTRAVENOUS | Status: AC
  Administered 2017-01-11: 20 mg via INTRAVENOUS

## 2017-01-11 MED ORDER — FENTANYL CITRATE (PF) 100 MCG/2ML IJ SOLN
50.0000 ug | INTRAMUSCULAR | Status: DC | PRN
Start: 2017-01-11 — End: 2017-01-14
  Administered 2017-01-13: 50 ug via INTRAVENOUS
  Filled 2017-01-11: qty 2

## 2017-01-11 MED ORDER — FENTANYL CITRATE (PF) 100 MCG/2ML IJ SOLN
50.0000 ug | INTRAMUSCULAR | Status: DC | PRN
  Administered 2017-01-12: 50 ug via INTRAVENOUS
  Filled 2017-01-11: qty 2

## 2017-01-11 MED ORDER — FENTANYL CITRATE (PF) 100 MCG/2ML IJ SOLN
200.0000 ug | Freq: Once | INTRAMUSCULAR | Status: AC
  Administered 2017-01-11: 200 ug via INTRAVENOUS

## 2017-01-11 MED ORDER — SODIUM CHLORIDE 0.45 % IV SOLN
INTRAVENOUS | Status: DC
  Administered 2017-01-11: 14:00:00 via INTRAVENOUS

## 2017-01-11 NOTE — Procedures (Signed)
Arterial Catheter Insertion Procedure Note Jay HibbsJessie Stephens [id number] 08/15/1945  Procedure: Insertion of Arterial Catheter  Indications: Blood pressure monitoring and Frequent blood sampling  Procedure Details Consent: Unable to obtain consent because of emergent medical necessity.  No family available, hypotensive.   Time Out: Verified patient identification, verified procedure, site/side was marked, verified correct patient position, special equipment/implants available, medications/allergies/relevent history reviewed, required imaging and test results available.  Performed  Maximum sterile technique was used including antiseptics, cap, gloves, gown, hand hygiene, mask and sheet. Skin prep: Chlorhexidine; local anesthetic administered 20 gauge catheter was inserted into right radial artery using the Seldinger technique.  Evaluation Blood flow good; BP tracing good. Complications: No apparent complications.   Procedure performed under direct supervision of Dr. Tyson Stephens.    Jay BrimBrandi Ollis, NP-C [facility] Pgr: [phone] or if no answer [phone] 01/11/2017, 3:29 PM   Jay Rossettianiel J. Jay AliasFeinstein, MD, FACP Pgr: [phone] [facility]

## 2017-01-11 NOTE — Evaluation (Signed)
SLP Cancellation Note  Patient Details Name: Jay Stephens MRN: [id number] DOB: 02/14/1946   Cancelled treatment:       Reason Eval/Treat Not Completed: Medical issues which prohibited therapy (rapid response called over night due to respiratory issues, will continue efforts for evaluation when pt appropriate) [name], [name]   [name], [name] Ann 01/11/2017, 8:35 AM

## 2017-01-11 NOTE — Procedures (Signed)
US rt chest  Large loculated effusion with fibrinous material and multiple loculatted lung flaps Highly suspicious for empyema Proceed to chest tube  [name], MD, FACP Pgr: [phone] [facility]
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
## 2017-01-11 NOTE — Procedures (Signed)
Central Venous Catheter Insertion Procedure Note Jay HibbsJessie Stephens [id number] 10/28/1945  Procedure: Insertion of Central Venous Catheter Indications: Assessment of intravascular volume, Drug and/or fluid administration and Frequent blood sampling  Procedure Details Consent: Unable to obtain consent because of emergent medical necessity.  No family available, pt hypotensive.    Time Out: Verified patient identification, verified procedure, site/side was marked, verified correct patient position, special equipment/implants available, medications/allergies/relevent history reviewed, required imaging and test results available.  Performed  Maximum sterile technique was used including antiseptics, cap, gloves, gown, hand hygiene, mask and sheet. Skin prep: Chlorhexidine; local anesthetic administered A antimicrobial bonded/coated triple lumen catheter was placed in the right internal jugular vein to 16 cm using the Seldinger technique.  Sutured in place, biopatch applied.  Evaluation Blood flow good Complications: No apparent complications Patient did tolerate procedure well. Chest X-ray ordered to verify placement.  CXR: pending.  Procedure performed under direct supervision of Dr. Tyson AliasFeinstein and with ultrasound guidance for real time vessel cannulation.     Jay BrimBrandi Ollis, NP-C [facility] Pgr: [phone] or if no answer [phone] 01/11/2017, 3:27 PM    Emergent need Jay RossettiDaniel J. Tyson AliasFeinstein, MD, FACP Pgr: [phone] [facility]

## 2017-01-11 NOTE — Procedures (Signed)
Chest Tube Insertion Procedure Note  Indications:  Clinically significant Empyema  Pre-operative Diagnosis: Empyema  Post-operative Diagnosis: Empyema  Procedure Details  Informed consent was obtained for the procedure, including sedation.  Risks of lung perforation, hemorrhage, arrhythmia, and adverse drug reaction were discussed.   After sterile skin prep, using standard technique, a 28 French tube was placed in the right lateral 6th rib space.  Findings: 3 liters  ml of purulent fluid obtained the most foul smelling thick pus empyema I have ever seen in my career  Estimated Blood Loss:  Minimal         Specimens:  Sent purulent fluid              Complications:  None; patient tolerated the procedure well.         Disposition: ICU - intubated and hemodynamically stable.         Condition: stable  Attending Attestation: I performed the procedure.  [name], MD, FACP Pgr: [phone] [facility]

## 2017-01-11 NOTE — Progress Notes (Signed)
MD was rounding on patient this morning and ask to call RN to call rapid response. Rapid response was called please see note.

## 2017-01-11 NOTE — Progress Notes (Signed)
CRITICAL VALUE ALERT  Critical Value:  LA 4.5  Date & Time Notied:  01/11/2017  Provider Notified: Dr. Tyson AliasFeinstein  Orders Received/Actions taken: paged

## 2017-01-11 NOTE — Significant Event (Signed)
Rapid Response Event Note  Overview:  Called by staff per Dr. Jonathon JordanGambino to assist with patient with possible STEMI Time Called: 0728 Arrival Time: 0730 Event Type: Cardiac, Respiratory  Initial Focused Assessment: On arrival patient supine in bed - arouses to name - minimally verbal - can say yes/no but question accuracy of response - right facial droop with tongue deviation - audible coarse BS - right side with minimal air movement - left side with coarse rhonchi throughout - not restless - warm and dry - not truly able to state if he is having pain but resting comfortably when not aroused - remains tachypnea with shallow resps - BP 158/108 HR ST 112 RR 32 O2 sats 94% on RA - placed on 2 liters nasal cannula.  CBG 187.     Interventions:  Dr. Herbie BaltimoreHarding in - reviewed 12 lead from admission last night and this AM at 0600 - no changes noted - no STEMI to be called - spoke with Dr. Frances FurbishWinfrey - informed no STEMI to be called and if needed to call formal consult with cards. Also requested MD to come to bedside to address concern for airway protection.  Patient repositioned for airway protection - nephew at bedside.  Dr. Primitivo GauzeFletcher to bedside - concerns of airway discussed.  To call PCCM consult.  On reassessment of patient - repositioned - more tachypnea - increased WOB - increased audible secretions -right side remains decreased - left side with rhonchi in all fields - oral suction done - little gag reflex - patient purposeful with left hand.  Spoke again with Dr. Frances FurbishWinfrey - increased concern patient needs to be moved to ICU ASAP if aggressive measures wished per family.  Dr. Jennette KettleNeal to bedside - update given.   Dr. Tyson AliasFeinstein to bedside - stat transfer to ICU.  Handoff to Home DepotJulian RN.  Questions answered.    Plan of Care (if not transferred):  Event Summary: Name of Physician Notified: Dr . Jonathon JordanGambino at  (at bedside called RRT)  Name of Consulting Physician Notified: Dr. Tyson AliasFeinstein at  (by Dr. Primitivo GauzeFletcher)  Outcome:  Transferred (Comment)  Event End Time: 0920  Delton PrairieBritt, Kawena Lyday L

## 2017-01-11 NOTE — Consult Note (Signed)
PULMONARY / CRITICAL CARE MEDICINE   Name: Jay Stephens MRN: [id number] DOB: 12-08-1945    ADMISSION DATE:  01/16/2017 CONSULTATION DATE:  01/11/2017  REFERRING MD: Kris Mouton  CHIEF COMPLAINT: AMS  HISTORY OF PRESENT ILLNESS:   Jay Stephens is a 71yo male with unknown PMH per EMR who was admitted to [facility] 7/23 for AMS, R facial droop, and generalized weakness. Last seen normal by nephew 7/22 PM. Per nephew, apparently A&Ox4 at baseline and able to some ADLs but requires some assistance. CT head negative for stroke but with area of encephalomalacia/gliosis in R temporal lobe. CT chest with large loculated hydropneumothorax on R chest and nonvisualization of RUL bronchus. Rapid response called 7/24 AM for concern for airway protection. Transferred to [facility] service.  PAST MEDICAL HISTORY :  He  has no past medical history on file.  Unknown  PAST SURGICAL HISTORY: He  has no past surgical history on file.  Unknown  No Known Allergies  No current facility-administered medications on file prior to encounter.    No current outpatient prescriptions on file prior to encounter.    FAMILY HISTORY:  His has no family status information on file.  Unknown  SOCIAL HISTORY: Unknown Nephew dropped him off to ER  REVIEW OF SYSTEMS:   Unable to assess due to intubation and sedation  SUBJECTIVE:  Unable to assess due to intubation and sedation  VITAL SIGNS: BP (!) 158/108 (BP Location: Right Arm)   Pulse (!) 111   Temp 99.1 F (37.3 C) (Oral)   Resp (!) 32   Ht _0  (1.854 m)   Wt 187 lb 2.7 oz (84.9 kg)   SpO2 98%   BMI 24.69 kg/m   HEMODYNAMICS:   VENTILATOR SETTINGS: Vent Mode: PRVC FiO2 (%):  [100 %] 100 % Set Rate:  [16 bmp] 16 bmp Vt Set:  [600 mL] 600 mL PEEP:  [5 cmH20] 5 cmH20 Plateau Pressure:  [19 cmH20] 19 cmH20   INTAKE / OUTPUT: No intake/output data recorded.  PHYSICAL EXAMINATION: General: frail, thin elderly gentleman, lying in bed; intubated Neuro: right  eye deviated laterally, right facial droop HEENT: intubated, bilateral temporal wasting, PERRL Cardiovascular: NR&RR; no m/r/g Lungs: coarse BS; minimal breath sounds R lung; R chest tube Abdomen: soft, non-distended, normal BS Musculoskeletal: very thin; no edema Skin: warm, dry  LABS:  BMET  Recent Labs Lab 01/06/2017 1618 12/25/2016 1715  NA 146* 149*  K 4.3 4.2  CL 113* 115*  CO2 19*  --   BUN 39* 38*  CREATININE 1.45* 1.30*  GLUCOSE 213* 205*    Electrolytes  Recent Labs Lab 12/29/2016 1618 01/13/2017 2209  CALCIUM 8.3*  --   MG  --  2.2  PHOS  --  4.4    CBC  Recent Labs Lab 01/05/2017 1618 01/12/2017 1715  WBC 8.8  --   HGB 11.2* 12.9*  HCT 36.4* 38.0*  PLT 245  --     Coag's  Recent Labs Lab 01/14/2017 1618  APTT 26  INR 1.15    Sepsis Markers No results for input(s): LATICACIDVEN, PROCALCITON, O2SATVEN in the last 168 hours.  ABG No results for input(s): PHART, PCO2ART, PO2ART in the last 168 hours.  Liver Enzymes  Recent Labs Lab 01/12/2017 1618  AST 79*  ALT 46  ALKPHOS 75  BILITOT 2.0*  ALBUMIN 2.0*    Cardiac Enzymes  Recent Labs Lab 01/09/2017 2209 01/11/17 0316  TROPONINI 0.03* 0.03*    Glucose  Recent Labs Lab 01/11/17  [place]    Imaging Dg Chest 2 View  Result Date: 12/31/2016 CLINICAL DATA:  Hydropneumothorax.  Shortness of breath. EXAM: CHEST  2 VIEW COMPARISON:  Chest CT performed immediately prior. FINDINGS: Right hydropneumothorax, with fluid level demonstrated on the lateral view. There is leftward mediastinal shift. Mediastinal contours are unchanged, right heart border obscured by right lung opacity. No left pneumothorax. No pulmonary edema. Fracture of left lateral fourth ribs is again seen. IMPRESSION: Large right hydropneumothorax with leftward mediastinal shift. Please reference concurrently performed chest CT for more detailed evaluation. Electronically Signed   By: Jeb Levering M.D.   On:  01/16/2017 20:53   Ct Head Wo Contrast  Result Date: 01/05/2017 CLINICAL DATA:  71 year old male with history of altered mental status and facial droop. EXAM: CT HEAD WITHOUT CONTRAST TECHNIQUE: Contiguous axial images were obtained from the base of the skull through the vertex without intravenous contrast. COMPARISON:  None. FINDINGS: Brain: Mild cerebral atrophy. Well-defined area of low attenuation and volume loss in the anterior aspect of the right temporal lobe, compatible with encephalomalacia/gliosis. Physiologic calcifications in the basal ganglia bilaterally. No evidence of acute infarction, hemorrhage, hydrocephalus, extra-axial collection or mass lesion/mass effect. Vascular: No hyperdense vessel or unexpected calcification. Skull: Postoperative changes of prior right pterional craniectomy. Sinuses/Orbits: Multifocal mucoperiosteal thickening in the maxillary sinuses bilaterally. Other: Poor dentition with multiple missing teeth, and periapical abscess associated with one of the left maxillary teeth (likely tooth number 11). IMPRESSION: 1. No acute intracranial abnormalities. 2. Chronic area of encephalomalacia/gliosis in the right temporal lobe, likely postoperative. 3. Mild cerebral atrophy. 4. Changes of chronic sinusitis in the maxillary sinuses bilaterally. 5. Poor dentition with periapical abscess around a left maxillary tooth (likely tooth number 11). Electronically Signed   By: Vinnie Langton M.D.   On: 01/08/2017 17:03   Ct Chest Wo Contrast  Result Date: 12/24/2016 CLINICAL DATA:  Patient came in with AMS today. CXR showed opacified right lung EXAM: CT CHEST WITHOUT CONTRAST TECHNIQUE: Multidetector CT imaging of the chest was performed following the standard protocol without IV contrast. COMPARISON:  None. FINDINGS: Cardiovascular: Extensive coronary calcifications. Heart size normal. No significant pericardial effusion. Mediastinum/Nodes: Leftward mediastinal shift. Limited  assessment for adenopathy due to adjacent opacities and lack of IV contrast. Lungs/Pleura: Large loculated right pleural effusion. The dominant component is posteriorly at the base, extending across midline. This extends along the posteromedial aspect of the pleural space to the apex with a large anterolateral component at the apex containing gas/fluid level. There is marked atelectasis of the right long with only trivial aerated segment in the lower lobe. Right upper lobe bronchus is occluded proximally. It is difficult to exclude a central mass. Left lung unremarkable.  No left effusion. Upper Abdomen: No acute findings. Musculoskeletal: Minimal spondylitic changes in the visualized lower cervical spine. Old fracture deformity of the left fourth rib. No acute fracture or worrisome bone lesion. IMPRESSION: 1. Large right loculated hydropneumothorax, with atelectatic right lung and leftward mediastinal shift. 2. Nonvisualization of right upper lobe bronchus suggesting central occluding lesion. Follow-up recommended. 3. Coronary calcifications. Electronically Signed   By: Lucrezia Europe M.D.   On: 01/06/2017 21:00   Dg Chest Portable 1 View  Result Date: 01/08/2017 CLINICAL DATA:  ams, facial droop. non verbal EXAM: PORTABLE CHEST - 1 VIEW COMPARISON:  none FINDINGS: There is near complete opacification of the right hemithorax. Lucency towards the apex but no definite lung markings suggesting possible layering hydropneumothorax. There is mediastinal  shift from right to left suggesting a degree of mass effect or tension. Heart size and mediastinal contours are within normal limits. Left lungs clear. Heart size difficult to assess due to adjacent opacity. Fracture the lateral aspect left third rib, age indeterminate. Fixation hardware projects over the mandible. IMPRESSION: 1. Opacification of the RIGHT hemithorax with some mass effect on the mediastinum. Can't exclude layering hydropneumothorax. Critical  Value/emergent results were called by telephone at the time of interpretation on 12/30/2016 at 4:54 pm to Dr. Davonna Belling , who verbally acknowledged these results. 2. LEFT third rib fracture, age indeterminate. Electronically Signed   By: Lucrezia Europe M.D.   On: 12/27/2016 16:55   STUDIES:  7/23 CT head>>no acute intracranial abnormalities. chronic area of encephalomalacia/gliosis in R temporal lobe, likely postoperative. Periapical abscess around L maxillary tooth. 7/23 CT chest>>large loculated R hydropneumothorax with atelectatic R lung and leftward mediastinal shift. Nonvisualization of RUL bronchus, suggesting central occluding lesion  CULTURES: 7/24 BAL Cx>>> 7/24 BC>>> 7/24 R pleural fluid Cx>>>  ANTIBIOTICS: Vanc 7/24>>> Zosyn 7/24>>>  SIGNIFICANT EVENTS: 7/23 admit to [facility] 7/24 rapid response, intubated, bronchoscopy, R chest tube placed with ~3L milky yellow pus drained  LINES/TUBES: 7/24 ETT>>> 7/24 chest tube rt>>>  ASSESSMENT / PLAN:  PULMONARY A: Concern for airway protection, patient now intubated CT with large loculated R hydropneumothorax & nonvisualized RUL bronchus - concern for obstructing mass - R chest tube placed, drained 3L odorous milky yellow fluid; EMpyema Likely empyema, start vanc/zosyn Bronchoscopy & BAL RLL 7/24 with compression of RUL bronchus and bronchus intermedius R/o aspiration PNA R PTX from compression over time P: F/u ABG R chest tube F/u BAL cultures and cytology Repeat CXR Start Vanc/zosyn F/u R pleural fluid cultures & cytology Will place central line for better access  CARDIOVASCULAR A:  EKG with no STEMI BNP 503 P:  F/u echo D/c troponins  RENAL A:   AKI, Cr 1.3; unknown baseline Cr Hypernatremia, hyperchloremia, likely from dehydration P:   F/u BMP Cont NS  GASTROINTESTINAL A:   AST to 79. History of liver disease/alcohol use unknown Low albumin with temporal muscle wasting, likely due to protein  malnutrition P:   PPI for SUP Continue to monitor LFTs NPO for now Cont NS  HEMATOLOGIC A:   Hb stable P: Daily CBCs SCDs  INFECTIOUS A:   Afebrile WBC 8.8->13.5 Large loculated R hydropneumothorax, now s/p R chest tube placement R/o aspiration PNA P: Vanc/zosyn F/u pleural & BAL cultures as above  ENDOCRINE A:   No active issues P:   Monitor blood glucose  NEUROLOGIC A:   AMS, R facial droop, and generalized weakness, concerning for stroke. Last normal 7/22 PM. CT head neg, electrolytes mostly normal, TSH neg P:   Consider MRI brain when pt stabilized Continue to monitor mental status F/u echo  Colbert Ewing, MD Internal Medicine, PGY-1  01/11/2017, 9:45 AM   STAFF NOTE: I, Merrie Roof, MD FACP have personally reviewed patient's available data, including medical history, events of note, physical examination and test results as part of my evaluation. I have discussed with resident/NP and other care providers such as pharmacist, RN and RRT. In addition, I personally evaluated patient and elicited key findings of: not awake, lethargic neurostatus, , perl facial droop rt, distress rr 60, ronchi and reduced BS rt, tachy intermittent, not fc, low muscle mass, pcxr which I reviewed and CT reveled large loculated effusion rt with air on top hydro pTX, also reveled compression rt main  stem unclear if obstruction mass vs extrensic, move to ICU , emergent intubation, he presents with concner acute cva and large loculation r/o empyema vs cancer, bronch done with extrinsic compression then [place] chest showing concerns empyema - chest tube placed and massive under pressure 3 liters of incredible foul smelling pus removed, pcxr with as expected residual ptx on apical secondary to duration of high pressure empyema compressing and infection causing gas likely, 8 cc/kg, may need MV increase, STAT ABG, ETT well placed on pcxr, get lactic, BC, administer stat ABX vanc, zosyn, will need  MRI brain when stable for this, unable for now, will need repeat lactic, keep MAP 150 and some permissive HTN if needed with concerns remaining cva, avoid free water, treat NONAG with some bicarb, start feeds when able, obtain cvp, place aline, will need stroke work up when able, limit sedation as able for neurochcks, add SSI, keep chest tube to suction, no family available The patient is critically ill with multiple organ systems failure and requires high complexity decision making for assessment and support, frequent evaluation and titration of therapies, application of advanced monitoring technologies and extensive interpretation of multiple databases.   Critical Care Time devoted to patient care services described in this note is 120 Minutes. This time reflects time of care of this signee: Merrie Roof, MD FACP. This critical care time does not reflect procedure time, or teaching time or supervisory time of PA/NP/Med student/Med Resident etc but could involve care discussion time. Rest per NP/medical resident whose note is outlined above and that I agree with   Lavon Paganini. Titus Mould, MD, [place] Pgr: [facility] 01/11/2017 3:01 PM
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
## 2017-01-11 NOTE — Consult Note (Signed)
Neurology Consultation  Reason for Consult: Concern for stroke Referring Physician: Dr. Titus Mould  CC: Right facial droop, confusion, aphasia  History is obtained from: Chart, nephew-[name] Baltes over the phone  HPI: Jay Stephens is a 71 y.o. male with no known past medical history according to the chart or the nephew who provided a history over the phone, who was brought in on 01/06/2017 for complaints of altered mental status, right facial droop and generalized weakness. According to the notes and the patient's nephew, he was last seen normal by his nephew in the evening of 01/09/2017 after that he was seen the next morning with slurred speech and some right-sided facial droop and appeared confused. According to the history and physical, as an initial GCS on arrival was 13. Noncontrast CT of the head showed a right temporal encephalomalacia and gliosis with a history of possible prior surgery/craniotomy. The patient became hypoxic and more confused and was found to have a loculated hydropneumothorax for which he required intubation and chest tube placement. He is in the ICU for management of his respirations. Critical care attending requested neurological consult because the initial presentation was right facial droop, word finding difficulty/aphasia and confusion and this raises suspicion for a underlying acute stroke.  According to the nephew who I spoke with on the phone, the patient has no past medical history. He does not take any medications at home and he has been living with his nephew and nephew's family for the past 1 year. The nephew wasn't aware of the patient has had any strokes in the past or heart attacks. The nephew did mention that the patient was sick with respiratory symptoms for the couple of days preceding the hospital presentation.  LKW: 3 days ago tpa given?: no, outside the time window Premorbid modified Rankin scale (mRS): 0   ICH Score: Not applicable   ROS: A 14  point ROS was performed with the nephew and was only positive for respiratory symptoms like cough and cold and mild fever prior to presentation. He denied any other symptoms or complaints.   No past medical history on file. Nephew not aware of any past medical history  No family history on file. Nephew unable to provide  Social History:   has no tobacco, alcohol, and drug history on file. Nephew said that he does not smoke drink or abuse drugs  No home medications per the nephew  Current Facility-Administered Medications:  .  0.9 %  sodium chloride infusion, , Intravenous, Continuous, Raylene Miyamoto, MD, Last Rate: 125 mL/hr at 01/11/17 1628 .  chlorhexidine gluconate (MEDLINE KIT) (PERIDEX) 0.12 % solution 15 mL, 15 mL, Mouth Rinse, BID, Raylene Miyamoto, MD, 15 mL at 01/11/17 1234 .  feeding supplement (VITAL AF 1.2 CAL) liquid 1,000 mL, 1,000 mL, Per Tube, Continuous, Raylene Miyamoto, MD, Last Rate: 65 mL/hr at 01/11/17 1654, 1,000 mL at 01/11/17 1654 .  fentaNYL (SUBLIMAZE) 100 MCG/2ML injection, , , ,  .  fentaNYL (SUBLIMAZE) injection 50 mcg, 50 mcg, Intravenous, Q15 min PRN, Raylene Miyamoto, MD .  fentaNYL (SUBLIMAZE) injection 50 mcg, 50 mcg, Intravenous, Q2H PRN, Raylene Miyamoto, MD .  folic acid injection 1 mg, 1 mg, Intravenous, Daily, Carlyle Dolly, MD, 1 mg at 01/11/17 1627 .  insulin aspart (novoLOG) injection 0-9 Units, 0-9 Units, Subcutaneous, Q4H, Raylene Miyamoto, MD .  MEDLINE mouth rinse, 15 mL, Mouth Rinse, 10 times per day, Raylene Miyamoto, MD, 15 mL at 01/11/17 1643 .  midazolam (VERSED) 2 MG/2ML injection, , , ,  .  pantoprazole (PROTONIX) injection 40 mg, 40 mg, Intravenous, Q12H, Carlyle Dolly, MD, 40 mg at 01/11/17 1216 .  piperacillin-tazobactam (ZOSYN) IVPB 3.375 g, 3.375 g, Intravenous, Q8H, Raylene Miyamoto, MD, Stopped at 01/11/17 1614 .  propofol (DIPRIVAN) 1000 MG/100ML infusion, 5-80 mcg/kg/min, Intravenous,  Titrated, Raylene Miyamoto, MD, Stopped at 01/11/17 0930 .  propofol (DIPRIVAN) 1000 MG/100ML infusion, , , ,  .  propofol (DIPRIVAN) 1000 MG/100ML infusion, 0-50 mcg/kg/min, Intravenous, Continuous, Raylene Miyamoto, MD, Stopped at 01/11/17 1000 .  sodium bicarbonate tablet 650 mg, 650 mg, Per Tube, TID, Raylene Miyamoto, MD, 650 mg at 01/11/17 1654 .  sodium chloride flush (NS) 0.9 % injection 3 mL, 3 mL, Intravenous, Q12H, Carlyle Dolly, MD, 3 mL at 01/11/17 1224 .  thiamine (B-1) injection 100 mg, 100 mg, Intravenous, Daily, Carlyle Dolly, MD, 100 mg at 01/11/17 1627 .  [START ON 01/12/2017] vancomycin (VANCOCIN) IVPB 750 mg/150 ml premix, 750 mg, Intravenous, Q12H, Mancheril, Darnell Level, RPH  Exam: Current vital signs: BP (!) 160/64   Pulse (!) 116   Temp (!) 97.3 F (36.3 C) (Oral)   Resp (!) 26   Ht _0  (1.803 m)   Wt 84.9 kg (187 lb 2.7 oz)   SpO2 97%   BMI 26.11 kg/m   Vital signs in last 24 hours: Temp:  [97.3 F (36.3 C)-99.1 F (37.3 C)] 97.3 F (36.3 C) (07/24 1219) Pulse Rate:  [66-148] 116 (07/24 1606) Resp:  [16-38] 26 (07/24 1606) BP: (80-188)/(25-153) 160/64 (07/24 1606) SpO2:  [80 %-100 %] 97 % (07/24 1606) Arterial Line BP: (156-160)/(67-69) 156/67 (07/24 1600) FiO2 (%):  [60 %-100 %] 60 % (07/24 1606) Weight:  [56 kg (123 lb 7.3 oz)-84.9 kg (187 lb 2.7 oz)] 84.9 kg (187 lb 2.7 oz) (07/24 0931) Gen.: Thin-looking man in no acute distress, sedated and intubated HEENT: Normocephalic, atraumatic, dry mucous membranes, no thyromegaly. Lungs clear to auscultation on the left, chest tube on the right. Abdomen soft nondistended Extremities warm well perfused Neurological exam Patient was sedated and intubated. Propofol was held for 15-20 minutes prior to the exam. Patient continued to be obtunded with no response to voice or noxious tinnitus. No spontaneous movements noted. Cranial nerves: Disconjugate gaze with His right eye was down and  out, pupils 3 mm and sluggishly reactive, corneal reflex present, per nursing report gag was present. Possible weakness of the right lower face. Motor exam:No spontaneous Movement noted. No response to noxious stimulus in any of the 4 extremities Sensory as above Coordination gait cannot be tested.   NIHSS 1a Level of Conscious.: 2  1b LOC Questions: 2  1c LOC Commands: 2  2 Chovan Gaze: 1  3 Visual: 0  4 Facial Palsy: 1  5a Motor Arm - left: 4  5b Motor Arm - Right: 4  6a Motor Leg - Left: 4  6b Motor Leg - Right: 4  7 Limb Ataxia: 0  8 Sensory:  9 [place] Language: 3  10 Dysarthria: un 11 Extinct. and Inatten.: 0 TOTAL: 27  Labs CBC    Component Value Date/Time   WBC 13.5 (H) 01/11/2017 1002   RBC 4.30 01/11/2017 1002   HGB 11.6 (L) 01/11/2017 1002   HCT 36.4 (L) 01/11/2017 1002   PLT 197 01/11/2017 1002   MCV 84.7 01/11/2017 1002   MCH 27.0 01/11/2017 1002   MCHC 31.9 01/11/2017 1002   RDW  14.3 01/11/2017 1002   LYMPHSABS 0.4 (L) 12/21/2016 1618   MONOABS 0.6 12/21/2016 1618   EOSABS 0.0 01/05/2017 1618   BASOSABS 0.0 01/12/2017 1618    CMP     Component Value Date/Time   NA 149 (H) 01/11/2017 1002   K 4.8 01/11/2017 1002   CL 121 (H) 01/11/2017 1002   CO2 13 (L) 01/11/2017 1002   GLUCOSE 186 (H) 01/11/2017 1002   BUN 50 (H) 01/11/2017 1002   CREATININE 1.35 (H) 01/11/2017 1002   CALCIUM 7.6 (L) 01/11/2017 1002   PROT 6.0 (L) 01/11/2017 1002   ALBUMIN 1.7 (L) 01/11/2017 1002   AST 80 (H) 01/11/2017 1002   ALT 53 01/11/2017 1002   ALKPHOS 63 01/11/2017 1002   BILITOT 1.9 (H) 01/11/2017 1002   GFRNONAA 52 (L) 01/11/2017 1002   GFRAA >60 01/11/2017 1002   Imaging I have reviewed the imaging in epic. 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 No acute bleed. No acute ischemic changes.   Assessment:  71 year old man with no known past medical history admitted for evaluation of altered mental status right facial droop and  generalized weakness. Found to have a reasonable sized loculated hydropneumothorax for which he required intubation and chest tube placement. Neurological exam was limited by sedation and intubation. Impression Evaluate for stroke Toxic embolic encephalopathy in the setting of an acute infection and hypoxia/hypoxemia  evaluate for hypoxic anoxic injury  Recommendations: At this time, we would recommend obtaining an MRI of the brain when stable. We would also like to obtain an exam off of sedation when possible. Correction of toxic metabolic derangements per the primary team as you are. No evidence of seizures based on clinical history or presentation. Low suspicion for seizures. Next on a fall the other workup remains unremarkable and his mentation does not improve, can consider doing an EEG to rule out nonconvulsive status given that he has a structural lesion in the right frontotemporal region. Attempt to obtain prior medical records as based on his CT scan it does look like that he had some sort of cranial surgery because of the presence of craniotomy and gliosis/encephalomalacia in that area as mentioned above. We will continue to follow.  Amie Portland, MD [facility] [phone]  If 7pm to 7am, please call on call as listed on AMION.

## 2017-01-11 NOTE — Progress Notes (Signed)
Pharmacy Antibiotic Note  Ferd HibbsJessie Sigler is a 71 y.o. male admitted on 12/20/2016 with empyema.  Pharmacy has been consulted for vancomycin dosing. CrCl ~ 45 mL/min. Baseline renal fx unknown.   Plan: Vancomycin 1750 mg IV once followed by Vancomycin 750 mg IV Q 12 hours  Height: 5\' 11"  (180.3 cm) Weight: 187 lb 2.7 oz (84.9 kg) IBW/kg (Calculated) : 75.3  Temp (24hrs), Avg:98.8 F (37.1 C), Min:97.9 F (36.6 C), Max:99.1 F (37.3 C)   Recent Labs Lab 01/17/2017 1618 01/04/2017 1715 01/11/17 1002  WBC 8.8  --  13.5*  CREATININE 1.45* 1.30* 1.35*    Estimated Creatinine Clearance: 55 mL/min (A) (by C-G formula based on SCr of 1.35 mg/dL (H)).    No Known Allergies  Antimicrobials this admission: Zosyn 7/24 >>  Vanc 7/24 >>   Dose adjustments this admission: None  Microbiology results: 7/24 Fluid >> 7/24 Sputum: >>  7/24 MRSA PCR: IP  Thank you for allowing pharmacy to be a part of this patient's care.  Vinnie LevelBenjamin Aiden Rao, PharmD., BCPS Clinical Pharmacist Pager [phone]

## 2017-01-11 NOTE — Progress Notes (Signed)
Patient hypotensive to the 80s systolic. eLink MD notified, another 1L NS bolus to be administered per orders.

## 2017-01-11 NOTE — Procedures (Signed)
Intubation Procedure Note Jay Stephens [id number] 1946-04-23  Procedure: Intubation Indications: Respiratory insufficiency  Procedure Details Consent: Unable to obtain consent because of emergent medical necessity. Time Out: Verified patient identification, verified procedure, site/side was marked, verified correct patient position, special equipment/implants available, medications/allergies/relevent history reviewed, required imaging and test results available.  Performed  Maximum sterile technique was used including cap, gloves, gown, hand hygiene and mask.  MAC and 4    Evaluation Hemodynamic Status: BP stable throughout; O2 sats: stable throughout Patient's Current Condition: stable Complications: No apparent complications Patient did tolerate procedure well. Chest X-ray ordered to verify placement.  CXR: pending.   Jay Stephens. 01/11/2017  glide

## 2017-01-11 NOTE — Progress Notes (Signed)
eLink Physician-Brief Progress Note Patient Name: Ferd HibbsJessie Carriveau DOB: 12/29/1945 MRN: [id number]   Date of Service  01/11/2017  HPI/Events of Note  Low CVP. Hypernatremia  eICU Interventions  NS bolus IVFs changed to 1/2 NS     Intervention Category Major Interventions: Other:  Merwyn KatosDavid B Tieler Cournoyer 01/11/2017, 6:45 PM
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
## 2017-01-11 NOTE — Progress Notes (Signed)
RT dropped BAL specimens off to lab and called cytology to let them know that it was in there. Cytology will pick up the specimen after main lab is done with it.

## 2017-01-11 NOTE — Procedures (Signed)
Bronchoscopy Procedure Note Dent Plantz [id number] [birth year]  Procedure: Bronchoscopy Indications: Diagnostic evaluation of the airways, Obtain specimens for culture and/or other diagnostic studies and Remove secretions  Procedure Details Consent: Unable to obtain consent because of emergent medical necessity. Time Out: Verified patient identification, verified procedure, site/side was marked, verified correct patient position, special equipment/implants available, medications/allergies/relevent history reviewed, required imaging and test results available.  Performed  In preparation for procedure, patient was given 100% FiO2 and bronchoscope lubricated. Sedation: Etomidate  Airway entered and the following bronchi were examined: RUL, RML, RLL, LUL, LLL and Bronchi.   Procedures performed: Brushings performed - no Bronchoscope removed.  , Patient placed back on 100% FiO2 at conclusion of procedure.    Evaluation Hemodynamic Status: BP stable throughout; O2 sats: stable throughout Patient's Current Condition: stable Specimens:  Sent serosanguinous fluid Complications: No apparent complications Patient did tolerate procedure well.   Raylene Miyamoto. 01/11/2017   1.total rt main collapse with secretions, removed 2. Edema and some compression to lobes RUL orifice 3. RLL compressed and fish mouth, hard to enter but able 4. Sup seg RLL rotated to lateral position and likely some extrensic compression to post wall at this spot, secretions throughout removed 5. No mass lesions or white cap leis ions 6. BAl cytology done , BAl culture  Highly suspicious extrinsic compression

## 2017-01-12 ENCOUNTER — Inpatient Hospital Stay (HOSPITAL_COMMUNITY): Payer: Medicare Other

## 2017-01-12 DIAGNOSIS — I7090 Unspecified atherosclerosis: Secondary | ICD-10-CM

## 2017-01-12 DIAGNOSIS — R4182 Altered mental status, unspecified: Secondary | ICD-10-CM

## 2017-01-12 LAB — CBC WITH DIFFERENTIAL/PLATELET
BASOS ABS: 0 10*3/uL (ref 0.0–0.1)
Basophils Relative: 0 %
EOS ABS: 0 10*3/uL (ref 0.0–0.7)
Eosinophils Relative: 0 %
HEMATOCRIT: 33.6 % — AB (ref 39.0–52.0)
HEMOGLOBIN: 10.3 g/dL — AB (ref 13.0–17.0)
LYMPHS PCT: 7 %
Lymphs Abs: 1.1 10*3/uL (ref 0.7–4.0)
MCH: 25.9 pg — ABNORMAL LOW (ref 26.0–34.0)
MCHC: 30.7 g/dL (ref 30.0–36.0)
MCV: 84.4 fL (ref 78.0–100.0)
MONOS PCT: 6 %
Monocytes Absolute: 0.9 10*3/uL (ref 0.1–1.0)
NEUTROS ABS: 13.8 10*3/uL — AB (ref 1.7–7.7)
NEUTROS PCT: 87 %
Platelets: 158 10*3/uL (ref 150–400)
RBC: 3.98 MIL/uL — ABNORMAL LOW (ref 4.22–5.81)
RDW: 14.2 % (ref 11.5–15.5)
WBC MORPHOLOGY: INCREASED
WBC: 15.8 10*3/uL — ABNORMAL HIGH (ref 4.0–10.5)

## 2017-01-12 LAB — BLOOD GAS, ARTERIAL
ACID-BASE DEFICIT: 5.3 mmol/L — AB (ref 0.0–2.0)
Bicarbonate: 18.2 mmol/L — ABNORMAL LOW (ref 20.0–28.0)
DRAWN BY: 51185
FIO2: 50
O2 SAT: 97.1 %
PEEP/CPAP: 5 cmH2O
PH ART: 7.429 (ref 7.350–7.450)
Patient temperature: 98.9
RATE: 12 resp/min
VT: 530 mL
pCO2 arterial: 28.1 mmHg — ABNORMAL LOW (ref 32.0–48.0)
pO2, Arterial: 111 mmHg — ABNORMAL HIGH (ref 83.0–108.0)

## 2017-01-12 LAB — GLUCOSE, CAPILLARY
GLUCOSE-CAPILLARY: 106 mg/dL — AB (ref 65–99)
GLUCOSE-CAPILLARY: 112 mg/dL — AB (ref 65–99)
GLUCOSE-CAPILLARY: 144 mg/dL — AB (ref 65–99)
GLUCOSE-CAPILLARY: 197 mg/dL — AB (ref 65–99)
Glucose-Capillary: 98 mg/dL (ref 65–99)
Glucose-Capillary: 132 mg/dL — ABNORMAL HIGH (ref 65–99)
Glucose-Capillary: 183 mg/dL — ABNORMAL HIGH (ref 65–99)
Glucose-Capillary: 215 mg/dL — ABNORMAL HIGH (ref 65–99)

## 2017-01-12 LAB — COMPREHENSIVE METABOLIC PANEL
ALBUMIN: 1.3 g/dL — AB (ref 3.5–5.0)
ALK PHOS: 51 U/L (ref 38–126)
ALT: 39 U/L (ref 17–63)
ANION GAP: 8 (ref 5–15)
AST: 61 U/L — ABNORMAL HIGH (ref 15–41)
BILIRUBIN TOTAL: 2 mg/dL — AB (ref 0.3–1.2)
BUN: 42 mg/dL — ABNORMAL HIGH (ref 6–20)
CALCIUM: 7 mg/dL — AB (ref 8.9–10.3)
CO2: 15 mmol/L — AB (ref 22–32)
Chloride: 126 mmol/L — ABNORMAL HIGH (ref 101–111)
Creatinine, Ser: 1.35 mg/dL — ABNORMAL HIGH (ref 0.61–1.24)
GFR calc non Af Amer: 52 mL/min — ABNORMAL LOW (ref >60)
GLUCOSE: 130 mg/dL — AB (ref 65–99)
POTASSIUM: 3.6 mmol/L (ref 3.5–5.1)
SODIUM: 149 mmol/L — AB (ref 135–145)
TOTAL PROTEIN: 4.7 g/dL — AB (ref 6.5–8.1)

## 2017-01-12 LAB — BASIC METABOLIC PANEL
ANION GAP: 6 (ref 5–15)
BUN: 33 mg/dL — ABNORMAL HIGH (ref 6–20)
CO2: 19 mmol/L — ABNORMAL LOW (ref 22–32)
Calcium: 6.7 mg/dL — ABNORMAL LOW (ref 8.9–10.3)
Chloride: 121 mmol/L — ABNORMAL HIGH (ref 101–111)
Creatinine, Ser: 1.09 mg/dL (ref 0.61–1.24)
GFR calc Af Amer: >60 mL/min (ref >60)
GLUCOSE: 195 mg/dL — AB (ref 65–99)
POTASSIUM: 3.8 mmol/L (ref 3.5–5.1)
Sodium: 146 mmol/L — ABNORMAL HIGH (ref 135–145)

## 2017-01-12 LAB — LACTATE DEHYDROGENASE, PLEURAL OR PERITONEAL FLUID: LD FL: 287 U/L — AB (ref 3–23)

## 2017-01-12 LAB — AMYLASE, PLEURAL OR PERITONEAL FLUID: AMYLASE FL: 15 U/L

## 2017-01-12 LAB — ECHOCARDIOGRAM COMPLETE
Ao-asc: 30 cm
EERAT: 9.34
EWDT: 180 ms
FS: 21 % — AB (ref 28–44)
Height: 71 in
IVS/LV PW RATIO, ED: 1.03
LA ID, A-P, ES: 31 mm
LA diam end sys: 31 mm
LA diam index: 1.85 cm/m2
LA vol A4C: 20.5 ml
LV E/e'average: 9.34
LV TDI E'LATERAL: 6.96
LV e' LATERAL: 6.96 cm/s
LVEEMED: 9.34
LVOT area: 3.8 cm2
LVOTD: 22 mm
MV Dec: 180
MV pk A vel: 71.3 m/s
MVPKEVEL: 65 m/s
PW: 9.66 mm — AB (ref 0.6–1.1)
RV LATERAL S' VELOCITY: 11.3 cm/s
Reg peak vel: 237 cm/s
TAPSE: 18.4 mm
TDI e' medial: 4.9
TR max vel: 237 cm/s
Weight: 2014.12 oz

## 2017-01-12 LAB — BODY FLUID CELL COUNT WITH DIFFERENTIAL
Neutrophil Count, Fluid: UNDETERMINED % (ref 0–25)
Total Nucleated Cell Count, Fluid: 9875 cu mm — ABNORMAL HIGH (ref 0–1000)

## 2017-01-12 LAB — CORTISOL: CORTISOL PLASMA: 22.4 ug/dL

## 2017-01-12 LAB — MAGNESIUM
MAGNESIUM: 1.6 mg/dL — AB (ref 1.7–2.4)
MAGNESIUM: 2.5 mg/dL — AB (ref 1.7–2.4)

## 2017-01-12 LAB — PHOSPHORUS
PHOSPHORUS: 2.3 mg/dL — AB (ref 2.5–4.6)
Phosphorus: 3.8 mg/dL (ref 2.5–4.6)

## 2017-01-12 LAB — GLUCOSE, PLEURAL OR PERITONEAL FLUID: Glucose, Fluid: <20 mg/dL

## 2017-01-12 LAB — PROTEIN, PLEURAL OR PERITONEAL FLUID: Total protein, fluid: <3 g/dL

## 2017-01-12 LAB — HEMOGLOBIN A1C
HEMOGLOBIN A1C: 7.1 % — AB (ref 4.8–5.6)
MEAN PLASMA GLUCOSE: 157 mg/dL

## 2017-01-12 MED ORDER — NOREPINEPHRINE BITARTRATE 1 MG/ML IV SOLN
0.0000 ug/min | INTRAVENOUS | Status: DC
  Administered 2017-01-12: 6 ug/min via INTRAVENOUS
  Filled 2017-01-12: qty 16

## 2017-01-12 MED ORDER — NOREPINEPHRINE BITARTRATE 1 MG/ML IV SOLN
0.0000 ug/min | INTRAVENOUS | Status: DC
  Filled 2017-01-12: qty 4

## 2017-01-12 MED ORDER — VITAMIN B-1 100 MG PO TABS
100.0000 mg | ORAL_TABLET | Freq: Every day | ORAL | Status: DC
  Administered 2017-01-13 – 2017-01-14 (×2): 100 mg
  Filled 2017-01-12 (×3): qty 1

## 2017-01-12 MED ORDER — PANTOPRAZOLE SODIUM 40 MG PO PACK
40.0000 mg | PACK | Freq: Every day | ORAL | Status: DC
Start: 2017-01-12 — End: 2017-01-14
  Administered 2017-01-13 – 2017-01-14 (×2): 40 mg
  Filled 2017-01-12 (×3): qty 20

## 2017-01-12 MED ORDER — MAGNESIUM SULFATE 4 GM/100ML IV SOLN
4.0000 g | Freq: Once | INTRAVENOUS | Status: AC
  Administered 2017-01-12: 4 g via INTRAVENOUS
  Filled 2017-01-12: qty 100

## 2017-01-12 MED ORDER — SODIUM CHLORIDE 0.9 % IV BOLUS (SEPSIS)
1000.0000 mL | Freq: Once | INTRAVENOUS | Status: AC
  Administered 2017-01-12: 1000 mL via INTRAVENOUS

## 2017-01-12 MED ORDER — SODIUM BICARBONATE 8.4 % IV SOLN
INTRAVENOUS | Status: DC
  Administered 2017-01-12 (×2): via INTRAVENOUS
  Filled 2017-01-12 (×5): qty 150

## 2017-01-12 MED ORDER — HEPARIN SODIUM (PORCINE) 5000 UNIT/ML IJ SOLN
5000.0000 [IU] | Freq: Three times a day (TID) | INTRAMUSCULAR | Status: DC
  Administered 2017-01-12 – 2017-01-21 (×27): 5000 [IU] via SUBCUTANEOUS
  Filled 2017-01-12 (×27): qty 1

## 2017-01-12 MED ORDER — VASOPRESSIN 20 UNIT/ML IV SOLN
0.0300 [IU]/min | INTRAVENOUS | Status: DC
  Administered 2017-01-12: 0.03 [IU]/min via INTRAVENOUS
  Filled 2017-01-12: qty 2

## 2017-01-12 MED ORDER — PROPOFOL 1000 MG/100ML IV EMUL
0.0000 ug/kg/min | INTRAVENOUS | Status: DC
  Administered 2017-01-12 – 2017-01-14 (×3): 10 ug/kg/min via INTRAVENOUS
  Filled 2017-01-12 (×3): qty 100

## 2017-01-12 MED ORDER — FOLIC ACID 1 MG PO TABS
1.0000 mg | ORAL_TABLET | Freq: Every day | ORAL | Status: DC
  Administered 2017-01-13 – 2017-01-14 (×2): 1 mg
  Filled 2017-01-12 (×3): qty 1

## 2017-01-12 MED ORDER — POTASSIUM CHLORIDE 10 MEQ/50ML IV SOLN
10.0000 meq | INTRAVENOUS | Status: AC
  Administered 2017-01-12 (×3): 10 meq via INTRAVENOUS
  Filled 2017-01-12 (×3): qty 50

## 2017-01-12 NOTE — Progress Notes (Signed)
Patient transported to MRI and returned to unit without any complications. 

## 2017-01-12 NOTE — Progress Notes (Signed)
EEG Completed; Results Pending  

## 2017-01-12 NOTE — Progress Notes (Signed)
SLP Cancellation Note  Patient Details Name: Jay HibbsJessie Stephens MRN: [id number] DOB: 01/31/1946   Cancelled treatment:       Reason Eval/Treat Not Completed: Medical issues which prohibited therapy (Pt currently orally intubated. Will continue efforts to evaluate when pt status improves.)  Celia B. WrightBueche, [facility] CenterMSP, CCC-SLP [phone]  Leigh AuroraBueche, Celia Brown 01/12/2017, 9:34 AM

## 2017-01-12 NOTE — Progress Notes (Signed)
  Echocardiogram 2D Echocardiogram has been performed.  Jay Stephens G Jay Stephens 01/12/2017, 11:37 AM

## 2017-01-12 NOTE — Progress Notes (Signed)
Neurology progress note  Subjective: Patient seen and examined. No new complaints Supposedly more awake.   Exam: Vitals:   01/12/17 1100 01/12/17 1119  BP:    Pulse: 99   Resp: (!) 22   Temp:  99.9 F (37.7 C)   Gen.: Thin-looking man in no acute distress, sedated and intubated HEENT: Normocephalic, atraumatic, dry mucous membranes, no thyromegaly. Lungs clear to auscultation on the left, chest tube on the right. Abdomen soft nondistended Extremities warm well perfused Neurological exam Patient was on minimal sedation. He was moving the left side spontaneously. Breathing over the vent He had a leftward gaze but was able to overcome by ocular cephalic maneuvers. Did not move the right side to noxious stimulus. Pupils are equal round reactive to light  Pertinent Labs: WBC 15.8 Creatinine 1.35 MRI was done overnight-MRI brain does not show any evidence of acute stroke. 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   Current Facility-Administered Medications:  .  0.45 % sodium chloride infusion, , Intravenous, Continuous, Wilhelmina Mcardle, MD, Last Rate: 125 mL/hr at 01/12/17 0445 .  chlorhexidine gluconate (MEDLINE KIT) (PERIDEX) 0.12 % solution 15 mL, 15 mL, Mouth Rinse, BID, Raylene Miyamoto, MD, 15 mL at 01/12/17 0825 .  feeding supplement (VITAL AF 1.2 CAL) liquid 1,000 mL, 1,000 mL, Per Tube, Continuous, Raylene Miyamoto, MD, Last Rate: 35 mL/hr at 01/12/17 0500, 1,000 mL at 01/12/17 0500 .  fentaNYL (SUBLIMAZE) injection 50 mcg, 50 mcg, Intravenous, Q15 min PRN, Raylene Miyamoto, MD, 50 mcg at 01/12/17 0220 .  fentaNYL (SUBLIMAZE) injection 50 mcg, 50 mcg, Intravenous, Q2H PRN, Raylene Miyamoto, MD .  folic acid injection 1 mg, 1 mg, Intravenous, Daily, Carlyle Dolly, MD, 1 mg at 01/12/17 1610 .  heparin injection 5,000 Units, 5,000 Units,  Subcutaneous, Q8H, Eloise Levels, MD .  insulin aspart (novoLOG) injection 0-9 Units, 0-9 Units, Subcutaneous, Q4H, Raylene Miyamoto, MD .  MEDLINE mouth rinse, 15 mL, Mouth Rinse, 10 times per day, Raylene Miyamoto, MD, 15 mL at 01/12/17 1000 .  norepinephrine (LEVOPHED) 16 mg in dextrose 5 % 250 mL (0.064 mg/mL) infusion, 0-40 mcg/min, Intravenous, Titrated, Raylene Miyamoto, MD, Last Rate: 5.6 mL/hr at 01/12/17 1115, 6 mcg/min at 01/12/17 1115 .  pantoprazole sodium (PROTONIX) 40 mg/20 mL oral suspension 40 mg, 40 mg, Per Tube, Daily, Eloise Levels, MD .  piperacillin-tazobactam (ZOSYN) IVPB 3.375 g, 3.375 g, Intravenous, Q8H, Raylene Miyamoto, MD, Stopped at 01/12/17 [phone] .  potassium chloride 10 mEq in 50 mL *CENTRAL LINE* IVPB, 10 mEq, Intravenous, Q1 Hr x 3, Eloise Levels, MD, Last Rate: 50 mL/hr at 01/12/17 1146, 10 mEq at 01/12/17 1146 .  propofol (DIPRIVAN) 1000 MG/100ML infusion, 0-50 mcg/kg/min, Intravenous, Continuous, Raylene Miyamoto, MD, Last Rate: 3.4 mL/hr at 01/12/17 0837, 10 mcg/kg/min at 01/12/17 0837 .  sodium bicarbonate 150 mEq in dextrose 5 % 1,000 mL infusion, , Intravenous, Continuous, Eloise Levels, MD, Last Rate: 100 mL/hr at 01/12/17 1147 .  sodium bicarbonate tablet 650 mg, 650 mg, Per Tube, TID, Raylene Miyamoto, MD, 650 mg at 01/12/17 [phone] .  sodium chloride 0.9 % bolus 1,000 mL, 1,000 mL, Intravenous, Once, Eloise Levels, MD .  sodium chloride flush (NS) 0.9 % injection 3 mL, 3 mL, Intravenous, Q12H, Carlyle Dolly, MD, 3 mL at 01/12/17 0939 .  thiamine (B-1) injection 100  mg, 100 mg, Intravenous, Daily, Carlyle Dolly, MD, 100 mg at 01/12/17 0936 .  vancomycin (VANCOCIN) IVPB 750 mg/150 ml premix, 750 mg, Intravenous, Q12H, Lavenia Atlas, RPH, Stopped at 01/12/17 0041 .  vasopressin (PITRESSIN) 40 Units in sodium chloride 0.9 % 250 mL (0.16 Units/mL) infusion, 0.03 Units/min, Intravenous, Continuous, Eloise Levels, MD, Last Rate: 11.3 mL/hr at 01/12/17 1141, 0.03 Units/min at 01/12/17 1141  Assessment 71 year old man with no known past medical history, admitted for evaluation of altered mental status, right facial droop and generalized weakness found to have loculated hydropneumothorax for which she required intubation and chest tube placement. Neurology consulted for concern for stroke. Exam today was extremely limited by sedation. Today's exam showed a right hemiparesis with leftward gaze  - based on this exam this should be consistent with a stroke but he just had an MRI done which did not show any evidence of stroke. Other considerations are seizures and toxic metabolic encephalopathy.  Impression Toxic metabolic encephalopathy Rule out seizure Rule out stroke  Recommendations I ordered a stat EEG that has been completed and read. No evidence of seizures. At this time, I would recommend treatment of toxic metabolic derangements per the critical care team as you are. We will follow this patient. Please call with questions  Amie Portland, MD [facility] [phone]  If 7pm to 7am, please call on call as listed on AMION.

## 2017-01-12 NOTE — Progress Notes (Signed)
PULMONARY / CRITICAL CARE MEDICINE   Name: Jay Stephens MRN: [id number] DOB: 04/15/1946    ADMISSION DATE:  12/28/2016 CONSULTATION DATE:  01/11/2017  REFERRING MD: Kris Mouton  CHIEF COMPLAINT: AMS  HISTORY OF PRESENT ILLNESS:   Jay Stephens is a 71yo male with unknown PMH per EMR who was admitted to [facility]MTS 7/23 for AMS, R facial droop, and generalized weakness. Last seen normal by nephew 7/22 PM. Per nephew, apparently A&Ox4 at baseline and able to some ADLs but requires some assistance. CT head negative for stroke but with area of encephalomalacia/gliosis in R temporal lobe. CT chest with large loculated hydropneumothorax on R chest and nonvisualization of RUL bronchus. Rapid response called 7/24 AM for concern for airway protection. Transferred to [facility] service.  SUBJECTIVE:  Intubated 7/24 and diagnostic bronchoscopy performed with BAL specimen sent for evaluation. Chest tube placed for clinically significant empyema, draining 3L purulent fluid. Initiated tube feeds. Right IJ CVC placed. Right radial arterial cath placed. Seen by neurology who recommend MRI brain and need an to examine off sedation. Could consider EEG.   VITAL SIGNS: BP (!) 67/39   Pulse (!) 103   Temp 98.3 F (36.8 C) (Oral)   Resp (!) 26   Ht _0  (1.803 m)   Wt 125 lb 14.1 oz (57.1 kg)   SpO2 99%   BMI 17.56 kg/m   HEMODYNAMICS: CVP:  [0 mmHg-4 mmHg] 0 mmHg  VENTILATOR SETTINGS: Vent Mode: PRVC FiO2 (%):  [50 %-100 %] 50 % Set Rate:  [12 bmp-16 bmp] 12 bmp Vt Set:  [530 mL-600 mL] 530 mL PEEP:  [5 cmH20] 5 cmH20 Plateau Pressure:  [11 cmH20-22 cmH20] 17 cmH20  INTAKE / OUTPUT: I/O last 3 completed shifts: In: 3969 [I.V.:1924; Other:30; NG/GT:365; IV Piggyback:1650] Out: 3016 [Urine:720; Chest Tube:2560]  PHYSICAL EXAMINATION: General:  71yo M ill appearing, sedated Neuro:  Eyes open, does not respond to voice or commands HEENT:  AT/[place], PERRL, MMM Cardiovascular:  RRR, no MRG Lungs:  Lungs: coarse  BS; minimal breath sounds R lung; R chest tube Abdomen:  Soft, NTND Musculoskeletal:  No gross deformities; diffuse muscle wasting Skin:  Warm and dry  LABS:  BMET  Recent Labs Lab 01/01/2017 1618 12/29/2016 1715 01/11/17 1002 01/12/17 0305  NA 146* 149* 149* 149*  K 4.3 4.2 4.8 3.6  CL 113* 115* 121* 126*  CO2 19*  --  13* 15*  BUN 39* 38* 50* 42*  CREATININE 1.45* 1.30* 1.35* 1.35*  GLUCOSE 213* 205* 186* 130*    Electrolytes  Recent Labs Lab 01/12/2017 1618 12/25/2016 2209 01/11/17 1002 01/11/17 1648 01/12/17 0305  CALCIUM 8.3*  --  7.6*  --  7.0*  MG  --  2.2  --  1.9 1.6*  PHOS  --  4.4  --  3.7 3.8    CBC  Recent Labs Lab 12/25/2016 1618 12/28/2016 1715 01/11/17 1002 01/12/17 0305  WBC 8.8  --  13.5* 15.8*  HGB 11.2* 12.9* 11.6* 10.3*  HCT 36.4* 38.0* 36.4* 33.6*  PLT 245  --  197 158    Coag's  Recent Labs Lab 01/05/2017 1618  APTT 26  INR 1.15    Sepsis Markers  Recent Labs Lab 01/11/17 1220 01/11/17 1357  LATICACIDVEN 4.5* 2.6*    ABG  Recent Labs Lab 01/11/17 1124  PHART 7.479*  PCO2ART 24.7*  PO2ART 227.0*    Liver Enzymes  Recent Labs Lab 01/09/2017 1618 01/11/17 1002 01/12/17 0305  AST 79* 80* 61*  ALT  46 53 39  ALKPHOS 75 63 51  BILITOT 2.0* 1.9* 2.0*  ALBUMIN 2.0* 1.7* 1.3*    Cardiac Enzymes  Recent Labs Lab 01/18/2017 2209 01/11/17 0316  TROPONINI 0.03* 0.03*    Glucose  Recent Labs Lab 01/11/17 0953 01/11/17 1216 01/11/17 1641 01/11/17 2022 01/12/17 0041 01/12/17 0439  GLUCAP 175* 137* 71 95 106* 112*    Imaging Mr Brain Wo Contrast  Result Date: 01/12/2017 CLINICAL DATA:  71 y/o M; altered mental status, right facial droop, generalized weakness. EXAM: MRI HEAD WITHOUT CONTRAST TECHNIQUE: Multiplanar, multiecho pulse sequences of the brain and surrounding structures were obtained without intravenous contrast. COMPARISON:  None. FINDINGS: Brain: Right temporal lobe hemosiderin stained chronic  encephalomalacia. No reduced diffusion to suggest acute or early subacute infarction. Moderate diffuse brain parenchymal volume loss. Symmetric increased extra-axial spaces over convexities and cerebellum following CSF on all sequences, probably in chronic hygroma, no significant mass effect on the underlying brain. Vascular: Persistent central flow voids. Skull and upper cervical spine: Normal marrow signal. Sinuses/Orbits: Moderate diffuse paranasal sinus mucosal thickening greater on the right. Trace left mastoid effusion. Orbits are unremarkable. Other: None. IMPRESSION: 1. No acute intracranial abnormality identified. 2. Right temporal lobe hemosiderin stained chronic encephalomalacia. 3. Moderate brain parenchymal volume loss. 4. Moderate diffuse paranasal sinus disease. 5. Prominent extra-axial CSF spaces, probably chronic hygroma, no significant mass effect on the brain. Electronically Signed   By: Kristine Garbe M.D.   On: 01/12/2017 04:18   Portable Chest Xray  Result Date: 01/12/2017 CLINICAL DATA:  Respiratory failure. EXAM: PORTABLE CHEST 1 VIEW COMPARISON:  01/11/2017 . FINDINGS: Right chest tube in stable position. Stable right sided pneumothorax. Right IJ line and NG tube in stable position. Endotracheal tube tip 1 cm above the carina. Proximal repositioning of 2 cm should be considered. Heart size stable. Diffuse right lung infiltrate again noted without interim change. Stable small right pleural effusion. IMPRESSION: 1. Right chest tube in stable position. Stable small right pneumothorax. Stable small right pleural effusion. 2. Endotracheal tube 1 cm above the carina. Proximal repositioning of approximately 2 cm should be considered. Right IJ line and NG tube in stable position. 3.  Diffuse right lung infiltrate again noted.  No interim change. Electronically Signed   By: Marcello Moores  Register   On: 01/12/2017 06:29   Dg Chest Port 1 View  Result Date: 01/11/2017 CLINICAL DATA:   Central line placement EXAM: PORTABLE CHEST 1 VIEW COMPARISON:  01/11/2017, 01/11/2017, 12/19/2016 FINDINGS: Endotracheal tube tip is just above the carina. Esophageal tube tip in the left upper quadrant. Placement of right-sided central venous catheter with tip overlying the SVC. Small moderate right apical pneumothorax re- demonstrated without significant interval change, 2.5 cm pleural-parenchymal separation at the apex. Right lower chest tube is in place with slight angulated appearance at the right lower lateral chest wall. Small residual right pleural effusion. Clear left lung. Stable cardiomediastinal silhouette. IMPRESSION: 1. Right-sided central venous catheter tip overlies the distal SVC 2. Endotracheal tube tip near the carina 3. No change in right pneumothorax or small right-sided pleural effusion. Slight angulated appearance of right lower chest drainage catheter at the lateral lower chest wall. Electronically Signed   By: Donavan Foil M.D.   On: 01/11/2017 15:58   Dg Chest Port 1 View  Result Date: 01/11/2017 CLINICAL DATA:  Chest tube placement EXAM: PORTABLE CHEST 1 VIEW COMPARISON:  01/11/2017 . FINDINGS: Interim placement right chest tube, its tip is over the right lower chest and incompletely  imaged. Interval removal of right pleural effusion Right-sided pneumothorax again noted without significant change. Right lung is clear. IMPRESSION: 1. Interval placement of right chest tube. Interval removal of right pleural effusion. Stable right pneumothorax. Right lung is clear. 2. Endotracheal tube and NG tube in stable position . Electronically Signed   By: Marcello Moores  Register   On: 01/11/2017 11:12   Portable Chest Xray  Result Date: 01/11/2017 CLINICAL DATA:  Assess positioning of the endotracheal tube. EXAM: PORTABLE CHEST 1 VIEW COMPARISON:  PA and lateral chest x-ray of January 10, 2017 FINDINGS: There is persistent near-total opacification of the right hemithorax with a faint pleural line  suspected in the apex. There is mild shift of the mediastinum toward the left. The left lung is well-expanded and clear. The endotracheal tube tip lies approximately 3.2 cm above the carina. The esophagogastric tube tip in proximal port lie in the gastric cardia. IMPRESSION: Reasonable positioning of the endotracheal and esophagogastric tubes. Further worsening of opacification throughout the right hemithorax consistent with known hydropneumothorax. Electronically Signed   By: David  Martinique M.D.   On: 01/11/2017 10:25   Dg Abd Portable 1v  Result Date: 01/11/2017 CLINICAL DATA:  71 year old male with orogastric tube placement. Initial encounter. EXAM: PORTABLE ABDOMEN - 1 VIEW COMPARISON:  None. FINDINGS: Nasogastric tube curled within the left upper quadrant of the abdomen within the region of the gastric fundus. Abnormal bowel gas pattern with featureless loops of bowel (small bowel/ colon) left abdomen with paucity of bowel gas for right abdomen. Loculated right hydropneumothorax. The possibility of free intraperitoneal air cannot be assessed on a supine view. Possible vascular calcifications. IMPRESSION: Nasogastric tube curled within the left upper quadrant of the abdomen within the region of the gastric fundus. Abnormal bowel gas pattern with featureless loops of bowel (small bowel/ colon) left abdomen with paucity of bowel gas for right abdomen. Electronically Signed   By: Genia Del M.D.   On: 01/11/2017 10:27     STUDIES:  7/23 CT head>>no acute intracranial abnormalities. chronic area of encephalomalacia/gliosis in R temporal lobe, likely postoperative. Periapical abscess around L maxillary tooth. 7/23 CT chest>>large loculated R hydropneumothorax with atelectatic R lung and leftward mediastinal shift. Nonvisualization of RUL bronchus, suggesting central occluding lesion 7/25 MRI brain> 1. No acute intracranial abnormality identified. 2. Right temporal lobe hemosiderin stained chronic  encephalomalacia. 3. Moderate brain parenchymal volume loss. 4. Moderate diffuse paranasal sinus disease. 5. Prominent extra-axial CSF spaces, probably chronic hygroma, no significant mass effect on the brain.  CULTURES: 7/24 BAL right Cx>>> MOD GNR, GPR, GPC 7/24 body culture>>> abundant GPC, GNR 7/24 R pleural fluid Cx>>>  ANTIBIOTICS: Vanc 7/24>>> Zosyn 7/24>>>  SIGNIFICANT EVENTS: 7/23 admit to FMTS 7/24 rapid response, intubated, bronchoscopy, R chest tube placed with ~3L milky yellow pus drained  LINES/TUBES: 7/23 PIV left forearm>> 7/24 ETT>> 7/24 chest tube rt>> 7/24 CVC RIJ>>   ASSESSMENT / PLAN:  PULMONARY A: CT with large loculated R effusion, chest tube placed and revealing empyema draining 3L pus -BAL cultures/cytology sent Emergent intubation for airway protection/resp distress P: R chest tube- draining  F/u BAL cultures and cytology CXR showing stable small R pneumothorax, stable small right pleural effusion and ETT 1cm above carian See ID AM CXR  CARDIOVASCULAR A:  EKG with no STEM, QTC 669, no medications to explain QTC BNP 503 P:  F/u echo Telemetry Neo MAP >65, currently @ 200 -switch to levophed and start vasopressin 4g magnesium KCl 2mq x3 AM EKG  RENAL A:   AKI, Cr 1.3; unknown baseline Cr Hypernatremia, hyperchloremia, likely from dehydration Hypomagnesemia/hypokalemia P:   D5W with bicarb 3amp _0 /hr Continue to follow BMP Replacing electrolytes as above  GASTROINTESTINAL A:   AST to 79. History of liver disease/alcohol use unknown Low albumin with temporal muscle wasting, likely due to protein malnutrition P:   protonix 18m daily per tube Continue tube feeds at 35cc/hr; following refeeding labs Follow LFTs  HEMATOLOGIC A:   Hb stable P: Daily CBCs SCDs and subcutaneous heparin  INFECTIOUS A:   Afebrile WBC 13.5>15.8 Large loculated R empyema, now s/p R chest tube placement P: Cont  Vanc/zosyn F/u pleural & BAL, BXC  ENDOCRINE A:   No active issues P:   Monitor blood glucose CBGs q4hrs, SSI   NEUROLOGIC A:   AMS, R facial droop, and generalized weakness, concerning for stroke. Last normal 7/22 PM. CT head neg, electrolytes mostly normal, TSH neg MRI brain showing no acute intracranial abnormality identified, chronic encephalomalacia at right temporal lobe Intubated P:   RAS goal: 0/-1 Propofol, fentanyl SBT if FIO2 <50%; PEEP 5  Daily [facility] Neurology following; appreciate assistance -consider EEG - exam off sedation  Pulmonary and CClayhatcheePager: [phone] 01/12/2017, 8:21 AM

## 2017-01-12 NOTE — Progress Notes (Signed)
Family Medicine Social Note  Saw and evaluated patient. Patient intubated, with chest tube noted on right side. Brown fluid in atrium. MRI results noted. Family medicine to continue following along [facility].  Myrene BuddyJacob Melanni Benway MD PGY-1 [facility] Resident

## 2017-01-12 NOTE — Procedures (Signed)
ELECTROENCEPHALOGRAM REPORT  Date of Study: 01/12/2017  Patient's Name: Jay Stephens MRN: [id number] Date of Birth: 02-Nov-1945  Referring Provider: Milon DikesAshish Arora, MD  Clinical History: 71 year old man brought in with altered mental status, right facial droop and aphasia.   Medications: fentaNYL (SUBLIMAZE) injection 50 mcg  folic acid injection 1 mg  insulin aspart (novoLOG) injection 0-9 Units  magnesium sulfate IVPB 4 g 100 mL  pantoprazole (PROTONIX) injection 40 mg  phenylephrine (NEO-SYNEPHRINE) 40 mg in sodium chloride 0.9 % 250 mL (0.16 mg/mL) infusion  piperacillin-tazobactam (ZOSYN) IVPB 3.375 g  potassium chloride 10 mEq in 50 mL *CENTRAL LINE* IVPB  propofol (DIPRIVAN) 1000 MG/100ML infusion  sodium bicarbonate tablet 650 mg  thiamine (B-1) injection 100 mg  vancomycin (VANCOCIN) IVPB 750 mg/150 ml premix  Technical Summary: A multichannel digital EEG recording measured by the international 10-20 system with electrodes applied with paste and impedances below 5000 ohms performed as portable with EKG monitoring in an intubated and unresponsive patient on propofol sedation.  Hyperventilation and photic stimulation were not performed.  The digital EEG was referentially recorded, reformatted, and digitally filtered in a variety of bipolar and referential montages for optimal display.   Description: The patient is intubated and unresponsive on propofol sedation during the recording.  There is diffuse 4-5 Hz theta and 2-3 Hz delta slowing without a discernible posterior dominant rhythm.  Stage 2 sleep is not seen.  There were no epileptiform discharges or electrographic seizures seen.    EKG lead was unremarkable.  Impression: This EEG is abnormal due to diffuse slowing of the waking background.  Clinical Correlation of the above findings indicates diffuse cerebral dysfunction that is non-specific in etiology.  It may be secondary to sedating pharmacologic effect, but also can be  seen with hypoxic/ischemic injury, toxic/metabolic encephalopathies, or other diffuse physiologic abnormality.  The absence of epileptiform discharges does not rule out a clinical diagnosis of epilepsy.  If clinically indicated, consider repeating study off of sedation.  Shon MilletAdam Jaffe, DO

## 2017-01-13 ENCOUNTER — Inpatient Hospital Stay (HOSPITAL_COMMUNITY): Payer: Medicare Other

## 2017-01-13 LAB — GLUCOSE, CAPILLARY
GLUCOSE-CAPILLARY: 191 mg/dL — AB (ref 65–99)
GLUCOSE-CAPILLARY: 194 mg/dL — AB (ref 65–99)
Glucose-Capillary: 187 mg/dL — ABNORMAL HIGH (ref 65–99)
Glucose-Capillary: 166 mg/dL — ABNORMAL HIGH (ref 65–99)
Glucose-Capillary: 114 mg/dL — ABNORMAL HIGH (ref 65–99)
Glucose-Capillary: 207 mg/dL — ABNORMAL HIGH (ref 65–99)

## 2017-01-13 LAB — BASIC METABOLIC PANEL
Anion gap: 5 (ref 5–15)
BUN: 28 mg/dL — AB (ref 6–20)
CHLORIDE: 118 mmol/L — AB (ref 101–111)
CO2: 24 mmol/L (ref 22–32)
Calcium: 6.8 mg/dL — ABNORMAL LOW (ref 8.9–10.3)
Creatinine, Ser: 1.02 mg/dL (ref 0.61–1.24)
GFR calc Af Amer: >60 mL/min (ref >60)
GFR calc non Af Amer: >60 mL/min (ref >60)
GLUCOSE: 199 mg/dL — AB (ref 65–99)
POTASSIUM: 3.3 mmol/L — AB (ref 3.5–5.1)
Sodium: 147 mmol/L — ABNORMAL HIGH (ref 135–145)

## 2017-01-13 LAB — CBC
HCT: 28.7 % — ABNORMAL LOW (ref 39.0–52.0)
HEMOGLOBIN: 8.9 g/dL — AB (ref 13.0–17.0)
MCH: 25.9 pg — AB (ref 26.0–34.0)
MCHC: 31 g/dL (ref 30.0–36.0)
MCV: 83.7 fL (ref 78.0–100.0)
PLATELETS: 86 10*3/uL — AB (ref 150–400)
RBC: 3.43 MIL/uL — AB (ref 4.22–5.81)
RDW: 14.3 % (ref 11.5–15.5)
WBC: 5.4 10*3/uL (ref 4.0–10.5)

## 2017-01-13 LAB — CULTURE, RESPIRATORY: SPECIAL REQUESTS: NORMAL

## 2017-01-13 LAB — CULTURE, RESPIRATORY W GRAM STAIN

## 2017-01-13 MED ORDER — POTASSIUM CHLORIDE 20 MEQ/15ML (10%) PO SOLN
20.0000 meq | ORAL | Status: AC
  Administered 2017-01-13 (×2): 20 meq
  Filled 2017-01-13 (×2): qty 15

## 2017-01-13 MED ORDER — FREE WATER
100.0000 mL | Freq: Four times a day (QID) | Status: DC
  Administered 2017-01-13 – 2017-01-14 (×4): 100 mL

## 2017-01-13 NOTE — Progress Notes (Signed)
NEUROLOGY PROGRESS NOTE  Subjective: [name] was laying in bed, intubated on my visit. He opens eyes to voice. Dark colored purulent draining from right chest tube continues.  Current Pertinent Medications: Propofol up to 2650mcg/kg/min  Pertinent Labs/Diagnostics: EEG 7/25: This EEG is abnormal due to diffuse slowing of the waking background. MR brain: No acute abnormality. Moderate parenchymal loss, right temporal encephalomalacia. Prominent extra-axial CSF spaces.  Physical Examination: Vitals:   01/13/17 0700 01/13/17 0713  BP: 118/65 118/65  Pulse: 87 88  Resp: (!) 25 (!) 21  Temp:      General: WD thin male.  HEENT:  Normocephalic, no lesions, without obvious abnormality.  Normal external eye and conjunctiva.  Normal external ears. Normal external nose, mucus membranes and septum.   Cardiovascular: S1, S2 normal, pulses palpable throughout   Pulmonary: chest clear, breathing on his own Abdomen: Soft Extremities: no joint deformities, effusion, or inflammation Musculoskeletal: no joint deformity or swelling  Skin: warm and dry, no hyperpigmentation, vitiligo, or suspicious lesions  Neurological Examination:  CN: Pupils are equal and round. No gaze preference this morning. They are symmetrically reactive from 3-->2 mm. Unable to assess EOM.  Slight right facial droop. Hearing is intact to loud voice.  Motor: Moves left to noxious stimulus Sensation: As above DTRs: Mute throughout, symmetric  Toes downgoing bilaterally. Coordination: He does not perform   Assessment:  71 year old male admitted with a loculated hydropneumothrorax. Associated AMS, generalized weakness and a right facial droop drove neurology consult with concern for stroke. Exam today improved from yesterday - no gaze preference, some movement on the left. MRI showed no evidence for stroke. EEG shows slowing. Although we can not completely rule out seizure with this assay, it is more likely that he is  encephalopathic secondary to his infection.  Recommendations: 1) Continue supportive care per PCCM 2) Please call with questions   Bruna PotterJamie Aldridge PA-C [facility] [phone]  01/13/2017, 9:00 AM  Neurohospitalist addendum Seen and examined. Patient's exam improved from yestreday. Likely multifactorial toxic metabolic encephalopathy. No evidence of active seizures or stroke on testing modalities. Please continue care per PCCM. Please call us as needed.  Milon DikesAshish Avianna Moynahan, MD [facility] [phone]  If 7pm to 7am, please call on call as listed on AMION.

## 2017-01-13 NOTE — Progress Notes (Signed)
[facility] FOR AM LAB REPLACEMENT ONLY  The patient does apply for the [facility] Electrolyte Replac[facility] Protocol based on the criteria listed below:   1. Is GFR >/= 40 ml/min? Yes.    Patient's GFR today is >60 2. Is urine output >/= 0.5 ml/kg/hr for the last 6 hours? Yes.   Patient's UOP is 0.6 ml/kg/hr 3. Is BUN < 60 mg/dL? Yes.    Patient's BUN today is 28 4. Abnormal electrolyte(s):K3.3 5. Ordered repletion with: Per protocol 6. If a panic level lab has been reported, has the CCM MD in charge been notified? Yes.  .   Physician:  Laural BenesS Sommer, MD  Melrose NakayamaChisholm, Manami Tutor William 01/13/2017 6:12 AM

## 2017-01-13 NOTE — Care Management Note (Addendum)
Case Management Note  Patient Details  Name: Jay Stephens MRN: [id number] Date of Birth: 08/27/1945  Subjective/Objective:   From hotel room where he lives with his nephew, presents with a loculated hydropneumothrorax, AMS, generalized weakness and a right facial droop  conts on vent , pressors off, has right chest tube, plan to try and extubate today.             Action/Plan: NCM will follow for dc needs.   Expected Discharge Date:                  Expected Discharge Plan:     In-House Referral:     Discharge planning Services  CM Consult  Post Acute Care Choice:    Choice offered to:     DME Arranged:    DME Agency:     HH Arranged:    HH Agency:     Status of Service:  In process, will continue to follow  If discussed at Long Length of Stay Meetings, dates discussed:    Additional Comments:  Leone Havenaylor, Elchanan Bob Clinton, RN 01/13/2017, 12:30 PM

## 2017-01-13 NOTE — Progress Notes (Signed)
Family Medicine Social Note  Saw and examined patient. Still intubated. Chest tube with dark output. Grew out strep viridans. On pressors. Agree with excellent care provided by critical care team. Will continue to follow socially until medically ready for floor or stepdown.  Myrene BuddyJacob Michalene Debruler MD PGY-1 Family Medicine Resident

## 2017-01-13 NOTE — Progress Notes (Signed)
PULMONARY / CRITICAL CARE MEDICINE   Name: Jay Stephens MRN: [id number] DOB: 1946-03-01    ADMISSION DATE:12/29/2016 CONSULTATION DATE:01/11/2017  REFERRING JQ:[id number]  CHIEF COMPLAINT:AMS  HISTORY OF PRESENT ILLNESS: Jay Stephens is a 71yo male with unknown PMH per EMR who was admitted to FMTS 7/23 for AMS, R facial droop, and generalized weakness. Last seen normal by nephew 7/22 PM. Per nephew, apparently A&Ox4 at baselineand able to someADLs but requires some assistance. CT head negative for stroke but with area of encephalomalacia/gliosis in R temporal lobe. CT chest with large loculated hydropneumothorax on R chest and nonvisualization of RUL bronchus. Rapid response called 7/24 AM for concern for airway protection. Transferred to [facility] service.  SUBJECTIVE:  No acute overnight events. Chest tube draining, weaned off pressors 7/26 AM.  VITAL SIGNS: BP 118/65   Pulse 88   Temp 98.6 F (37 C) (Axillary)   Resp (!) 21   Ht _0  (1.803 m)   Wt 133 lb 9.6 oz (60.6 kg)   SpO2 100%   BMI 18.63 kg/m   HEMODYNAMICS:    VENTILATOR SETTINGS: Vent Mode: PSV;CPAP FiO2 (%):  [40 %-50 %] 40 % Set Rate:  [12 bmp] 12 bmp Vt Set:  [530 mL] 530 mL PEEP:  [5 cmH20] 5 cmH20 Pressure Support:  [5 cmH20] 5 cmH20 Plateau Pressure:  [5 cmH20-16 cmH20] 5 cmH20  INTAKE / OUTPUT: I/O last 3 completed shifts: In: 9313.9 [I.V.:5070.5; Other:120; NG/GT:1273.3; IV Piggyback:2850] Out: 2605 [Urine:1865; Chest Tube:740]  PHYSICAL EXAMINATION: General:  71yo M ill appearing, sedated Neuro:  Eyes open, does not respond to voice or commands HEENT:  AT/[place], PERRL, MMM Cardiovascular:  RRR, no MRG Lungs:  Lungs: coarse BS; minimal breath sounds R lung; R chest tube Abdomen:  Soft, NTND Musculoskeletal:  No gross deformities; diffuse muscle wasting Skin:  Warm and dry  LABS:  BMET  Recent Labs Lab 01/12/17 0305 01/12/17 1626 01/13/17 0435  NA 149* 146* 147*  K 3.6 3.8 3.3*  CL  126* 121* 118*  CO2 15* 19* 24  BUN 42* 33* 28*  CREATININE 1.35* 1.09 1.02  GLUCOSE 130* 195* 199*    Electrolytes  Recent Labs Lab 01/11/17 1648 01/12/17 0305 01/12/17 1626 01/13/17 0435  CALCIUM  --  7.0* 6.7* 6.8*  MG 1.9 1.6* 2.5*  --   PHOS 3.7 3.8 2.3*  --     CBC  Recent Labs Lab 01/11/17 1002 01/12/17 0305 01/13/17 0435  WBC 13.5* 15.8* 5.4  HGB 11.6* 10.3* 8.9*  HCT 36.4* 33.6* 28.7*  PLT 197 158 PENDING    Coag's  Recent Labs Lab 01/12/2017 1618  APTT 26  INR 1.15    Sepsis Markers  Recent Labs Lab 01/11/17 1220 01/11/17 1357  LATICACIDVEN 4.5* 2.6*    ABG  Recent Labs Lab 01/11/17 1124 01/12/17 1647  PHART 7.479* 7.429  PCO2ART 24.7* 28.1*  PO2ART 227.0* 111*    Liver Enzymes  Recent Labs Lab 01/09/2017 1618 01/11/17 1002 01/12/17 0305  AST 79* 80* 61*  ALT 46 53 39  ALKPHOS 75 63 51  BILITOT 2.0* 1.9* 2.0*  ALBUMIN 2.0* 1.7* 1.3*    Cardiac Enzymes  Recent Labs Lab 01/11/2017 2209 01/11/17 0316  TROPONINI 0.03* 0.03*    Glucose  Recent Labs Lab 01/12/17 1345 01/12/17 1515 01/12/17 1926 01/12/17 2304 01/13/17 0306 01/13/17 0728  GLUCAP 183* 144* 197* 215* 187* 191*    Imaging Dg Chest Port 1 View  Result Date: 01/13/2017 CLINICAL DATA:  Intubation.  EXAM: PORTABLE CHEST 1 VIEW COMPARISON:  01/12/2017. FINDINGS: Endotracheal tube has been repositioned and its tip is 4 cm above the carina. NG tube, right IJ line stable position. Right chest tube in stable position. Right pneumothorax stable. Diffuse right lung infiltrate, slightly improved from prior exam. Low lung volumes with basilar atelectasis. Stable right pleural thickening. Heart size stable. IMPRESSION: 1. Endotracheal tube is been repositioned, its tip is in good anatomic position 4 cm above the carina . Right IJ line and NG tube in stable position . Right chest tube in stable position. Stable right pneumothorax. Stable right pleural thickening. 2.  Diffuse slight lung infiltrate again noted. Slight improvement from prior exam. Electronically Signed   By: Marcello Moores  Register   On: 01/13/2017 06:25     STUDIES: 7/23 CT head>>no acute intracranial abnormalities. chronic area of encephalomalacia/gliosis in R temporal lobe, likely postoperative. Periapical abscess around L maxillary tooth. 7/23 CT chest>>large loculated R hydropneumothorax with atelectatic R lung and leftward mediastinal shift. Nonvisualization of RUL bronchus, suggesting central occluding lesion 7/25 MRI brain> 1. No acute intracranial abnormality identified. 2. Right temporal lobe hemosiderin stained chronic encephalomalacia. 3. Moderate brain parenchymal volume loss. 4. Moderate diffuse paranasal sinus disease. 5. Prominent extra-axial CSF spaces, probably chronic hygroma, no significant mass effect on the brain. 7/25> ECHO: 55-60%, Trileaflet; moderately thickened, moderately   calcified leaflets, G2DD and akinesis of the   inferolateral myocardium  CULTURES: 7/24 BAL right Cx>>> MOD GNR, GPR, GPC 7/24 R pleural fluid Cx>>>abundant GPC, GNR and viridans strep 7/24 Blood cultures x2>> NGTD  ANTIBIOTICS: Vanc 7/24>>>7/25 Zosyn 7/24>>>  SIGNIFICANT EVENTS: 7/23 admit to FMTS 7/24 rapid response, intubated, bronchoscopy, R chest tube placed with ~3L milky yellow pus drained  LINES/TUBES: 7/23 PIV left forearm>> 7/24 ETT>> 7/24 chest tube rt>> 7/24 CVC RIJ>>  ASSESSMENT / PLAN:  PULMONARY A: CT with large loculated R effusion, chest tube placed and revealing empyema draining 3L pus -BAL cultures/cytology sent Emergent intubation for airway protection/resp distress P: R chest tube- draining 440cc F/u BAL cultures and cytology as above CXR with diffuse slight lung infiltrate again noted. Slight improvement from prior exam. See ID SBT today Repeat CXR  CARDIOVASCULAR A:  EKG with no STEM, QTC 669, no medications to explain QTC -repeat EKG with  QTC 495 BNP 503 P: Telemetry Levo MAP >65, off pressors since 4AM  RENAL A:  AKI, Cr 1.3>1.01 Hypernatremia, hyperchloremia, likely from dehydration hypokalemia P: D/c D5 with bicarb Free water 100cc q6hrs Continue to follow BMP Replacing electrolytes as above KCL 18mq q4hrs (2 doses)  GASTROINTESTINAL A:  AST to 79. History of liver disease/alcohol useunknown Low albumin with temporal muscle wasting, likely due to protein malnutrition P: protonix 411mdaily per tube Continue tube feeds at 55cc/hr; following refeeding labs Follow LFTs  HEMATOLOGIC A:  Hb stable P: Daily CBCs SCDs and subcutaneous heparin  INFECTIOUS A:  Afebrile WBC 15.8>5.4 Large loculated R empyema, now s/p R chest tube placement P: Cont zosyn F/u pleural & BAL, BXC  ENDOCRINE A:  No active issues P: Monitor blood glucose CBGs q4hrs, SSI  NEUROLOGIC A:  AMS, R facial droop, and generalized weakness, concerning for stroke. Last normal 7/22 PM. CT head neg, electrolytes mostly normal, TSH neg MRI brain showing no acute intracranial abnormality identified, chronic encephalomalacia at right temporal lobe Intubated P: RAS goal: 0/-1 Turn off Propofol; work on extubating today Daily WUWallereurology following; appreciate assistance -EEG showed no seizure activity; continue metabolic correction  Pulmonary and [place] Pager: [phone]  01/13/2017, 8:10 AM

## 2017-01-14 ENCOUNTER — Inpatient Hospital Stay (HOSPITAL_COMMUNITY): Payer: Medicare Other

## 2017-01-14 ENCOUNTER — Encounter (HOSPITAL_COMMUNITY): Payer: Self-pay | Admitting: *Deleted

## 2017-01-14 LAB — CBC
HEMATOCRIT: 28.3 % — AB (ref 39.0–52.0)
Hemoglobin: 8.9 g/dL — ABNORMAL LOW (ref 13.0–17.0)
MCH: 26.5 pg (ref 26.0–34.0)
MCHC: 31.4 g/dL (ref 30.0–36.0)
MCV: 84.2 fL (ref 78.0–100.0)
Platelets: 73 10*3/uL — ABNORMAL LOW (ref 150–400)
RBC: 3.36 MIL/uL — ABNORMAL LOW (ref 4.22–5.81)
RDW: 14.4 % (ref 11.5–15.5)
WBC: 5.4 10*3/uL (ref 4.0–10.5)

## 2017-01-14 LAB — BASIC METABOLIC PANEL
Anion gap: 6 (ref 5–15)
BUN: 23 mg/dL — AB (ref 6–20)
CHLORIDE: 116 mmol/L — AB (ref 101–111)
CO2: 27 mmol/L (ref 22–32)
Calcium: 7 mg/dL — ABNORMAL LOW (ref 8.9–10.3)
Creatinine, Ser: 0.97 mg/dL (ref 0.61–1.24)
GFR calc Af Amer: >60 mL/min (ref >60)
GFR calc non Af Amer: >60 mL/min (ref >60)
GLUCOSE: 216 mg/dL — AB (ref 65–99)
POTASSIUM: 3.4 mmol/L — AB (ref 3.5–5.1)
Sodium: 149 mmol/L — ABNORMAL HIGH (ref 135–145)

## 2017-01-14 LAB — GLUCOSE, CAPILLARY
GLUCOSE-CAPILLARY: 199 mg/dL — AB (ref 65–99)
GLUCOSE-CAPILLARY: 158 mg/dL — AB (ref 65–99)
GLUCOSE-CAPILLARY: 132 mg/dL — AB (ref 65–99)
Glucose-Capillary: 183 mg/dL — ABNORMAL HIGH (ref 65–99)
Glucose-Capillary: 171 mg/dL — ABNORMAL HIGH (ref 65–99)
Glucose-Capillary: 117 mg/dL — ABNORMAL HIGH (ref 65–99)

## 2017-01-14 LAB — TRIGLYCERIDES: Triglycerides: 104 mg/dL (ref <150)

## 2017-01-14 MED ORDER — LORAZEPAM 2 MG/ML IJ SOLN: 1.0000 mg | INTRAMUSCULAR | Status: DC | PRN

## 2017-01-14 MED ORDER — POTASSIUM CHLORIDE 20 MEQ/15ML (10%) PO SOLN
40.0000 meq | ORAL | Status: AC
  Administered 2017-01-14 (×2): 40 meq
  Filled 2017-01-14 (×2): qty 30

## 2017-01-14 MED ORDER — KCL IN DEXTROSE-NACL 20-5-0.45 MEQ/L-%-% IV SOLN
INTRAVENOUS | Status: DC
  Administered 2017-01-14 – 2017-01-17 (×7): via INTRAVENOUS
  Administered 2017-01-18: 75 mL/h via INTRAVENOUS
  Administered 2017-01-19 – 2017-01-21 (×4): via INTRAVENOUS
  Filled 2017-01-14 (×14): qty 1000

## 2017-01-14 MED ORDER — PNEUMOCOCCAL VAC POLYVALENT 25 MCG/0.5ML IJ INJ
0.5000 mL | INJECTION | INTRAMUSCULAR | Status: AC
  Administered 2017-01-15: 0.5 mL via INTRAMUSCULAR
  Filled 2017-01-14: qty 0.5

## 2017-01-14 MED ORDER — FOLIC ACID 5 MG/ML IJ SOLN
1.0000 mg | Freq: Every day | INTRAMUSCULAR | Status: DC
  Administered 2017-01-15 – 2017-01-17 (×3): 1 mg via INTRAVENOUS
  Filled 2017-01-14 (×6): qty 0.2

## 2017-01-14 MED ORDER — THIAMINE HCL 100 MG/ML IJ SOLN
100.0000 mg | Freq: Every day | INTRAMUSCULAR | Status: DC
  Administered 2017-01-15 – 2017-01-17 (×3): 100 mg via INTRAVENOUS
  Filled 2017-01-14 (×2): qty 2
  Filled 2017-01-14: qty 1

## 2017-01-14 MED ORDER — PANTOPRAZOLE SODIUM 40 MG IV SOLR
40.0000 mg | Freq: Every day | INTRAVENOUS | Status: DC
  Administered 2017-01-14: 40 mg via INTRAVENOUS
  Filled 2017-01-14: qty 40

## 2017-01-14 MED ORDER — ORAL CARE MOUTH RINSE
15.0000 mL | Freq: Two times a day (BID) | OROMUCOSAL | Status: DC
  Administered 2017-01-14 – 2017-01-21 (×14): 15 mL via OROMUCOSAL

## 2017-01-14 NOTE — Progress Notes (Signed)
NEUROLOGY PROGRESS NOTE  Subjective: [name] was laying in bed, intubated on my visit. He opens eyes to voice. Dark colored purulent draining from right chest tube continues.  Current Pertinent Medications: Not on sedation since this AM  Pertinent Labs/Diagnostics: EEG 7/25: ThisEEG is abnormal due to diffuse slowing of the waking background. MR brain: No acute abnormality. Moderate parenchymal loss, right temporal encephalomalacia. Prominent extra-axial CSF spaces.  Exam: Vitals:   01/14/17 1100 01/14/17 1101  BP: 109/65   Pulse: 90   Resp: (!) 30   Temp:  98.3 F (36.8 C)  Gen. Exam well-developed, thin man in no acute distress, intubated HEENT, normocephalic/atraumatic Cardia vascular, S1-S2 heard palpable pulses all over Respiratory-intubated Abdomensoft Extremitiesno deformities Neurological exam Drowsy but responds to voice. Closed eyes on command. Open eyes on command WU:[id number]:Pupils are equal and round. No gaze preference this morning. They are symmetrically reactive from 3-->2 mm. Unable to assess EOM.  Slight right facial droop. Hearing is intact to loud voice.  Motor:follows briskly on the left, barely antigravity in right upper extremity, wiggles both toes to commands Sensation: ithdraws strongly on the left to noxious stim as compared to right DTRs:[place] throughout, symmetric  Toes downgoing bilaterally. Coordination: He does not perform  Pertinent Labs: CBC    Component Value Date/Time   WBC 5.4 01/14/2017 0342   RBC 3.36 (L) 01/14/2017 0342   HGB 8.9 (L) 01/14/2017 0342   HCT 28.3 (L) 01/14/2017 0342   PLT 73 (L) 01/14/2017 0342   MCV 84.2 01/14/2017 0342   MCH 26.5 01/14/2017 0342   MCHC 31.4 01/14/2017 0342   RDW 14.4 01/14/2017 0342   LYMPHSABS 1.1 01/12/2017 0305   MONOABS 0.9 01/12/2017 0305   EOSABS 0.0 01/12/2017 0305   BASOSABS 0.0 01/12/2017 0305   CMP     Component Value Date/Time   NA 149 (H) 01/14/2017 0342   K 3.4 (L) 01/14/2017 0342    CL 116 (H) 01/14/2017 0342   CO2 27 01/14/2017 0342   GLUCOSE 216 (H) 01/14/2017 0342   BUN 23 (H) 01/14/2017 0342   CREATININE 0.97 01/14/2017 0342   CALCIUM 7.0 (L) 01/14/2017 0342   PROT 4.7 (L) 01/12/2017 0305   ALBUMIN 1.3 (L) 01/12/2017 0305   AST 61 (H) 01/12/2017 0305   ALT 39 01/12/2017 0305   ALKPHOS 51 01/12/2017 0305   BILITOT 2.0 (H) 01/12/2017 0305   GFRNONAA >60 01/14/2017 0342   GFRAA >60 01/14/2017 0342    Current Facility-Administered Medications:  .  dextrose 5 % and 0.45 % NaCl with KCl 20 mEq/L infusion, , Intravenous, Continuous, Renne MuscaWarden, Daniel L, MD, Last Rate: 50 mL/hr at 01/14/17 1054 .  feeding supplement (VITAL AF 1.2 CAL) liquid 1,000 mL, 1,000 mL, Per Tube, Continuous, Nelda BucksFeinstein, Daniel J, MD, Stopped at 01/14/17 1005 .  fentaNYL (SUBLIMAZE) injection 50 mcg, 50 mcg, Intravenous, Q2H PRN, Nelda BucksFeinstein, Daniel J, MD, 50 mcg at 01/13/17 1252 .  folic acid injection 1 mg, 1 mg, Intravenous, Daily, Renne MuscaWarden, Daniel L, MD .  heparin injection 5,000 Units, 5,000 Units, Subcutaneous, Q8H, Renne MuscaWarden, Daniel L, MD, 5,000 Units at 01/14/17 [phone] .  insulin aspart (novoLOG) injection 0-9 Units, 0-9 Units, Subcutaneous, Q4H, Nelda BucksFeinstein, Daniel J, MD, 2 Units at 01/14/17 [phone] .  LORazepam (ATIVAN) injection 1-4 mg, 1-4 mg, Intravenous, Q4H PRN, Renne MuscaWarden, Daniel L, MD .  MEDLINE mouth rinse, 15 mL, Mouth Rinse, BID, Sood, Vineet, MD .  pantoprazole sodium (PROTONIX) 40 mg/20 mL oral suspension 40 mg, 40 mg,  Per Tube, Daily, Renne MuscaWarden, Daniel L, MD, 40 mg at 01/14/17 [id number] .  piperacillin-tazobactam (ZOSYN) IVPB 3.375 g, 3.375 g, Intravenous, Q8H, Nelda BucksFeinstein, Daniel J, MD, Stopped at 01/14/17 0732 .  propofol (DIPRIVAN) 1000 MG/100ML infusion, 0-50 mcg/kg/min, Intravenous, Continuous, Nelda BucksFeinstein, Daniel J, MD, Stopped at 01/14/17 1010 .  sodium chloride flush (NS) 0.9 % injection 3 mL, 3 mL, Intravenous, Q12H, Beaulah DinningGambino, Christina M, MD, 3 mL at 01/14/17 0907 .  thiamine (VITAMIN B-1)  tablet 100 mg, 100 mg, Per Tube, Daily, Coralyn HellingSood, Vineet, MD, 100 mg at 01/14/17 [phone]   Impression: 71 year old admitted with loculated hydropneumothorax, altered mental status,noted to have possible right facial droop and right-sided weakness. EEG and MRI unremarkable and revealing evidence of stroke or acute stroke. Imaging studies do show old areas of encephalomalacia in multiple vascular territories. Likely multifactorial toxic metabolic encephalopathy at this time. Most likely explanation of the right-sided weakness and facial droop is probably recrudescence of old symptoms in the setting of the current critical illness.  Recommendations Minimize sedation medications Treatment of the hydropneumothorax per primary team as you are No anti-epileptics time Please recall with questions.  Milon DikesAshish Malosi Hemstreet, MD [facility] [phone]  If 7pm to 7am, please call on call as listed on AMION.

## 2017-01-14 NOTE — Progress Notes (Signed)
PULMONARY / CRITICAL CARE MEDICINE   Name: Jay Stephens MRN: [id number] DOB: [birth year]    ADMISSION DATE:12/22/2016 CONSULTATION DATE:01/11/2017  REFERRING RF:[id number]  CHIEF COMPLAINT:AMS  HISTORY OF PRESENT ILLNESS: Jay Stephens is a 71yo male with unknown PMH per EMR who was admitted to FMTS 7/23 for AMS, R facial droop, and generalized weakness. Last seen normal by nephew 7/22 PM. Per nephew, apparently A&Ox4 at baselineand able to someADLs but requires some assistance. CT head negative for stroke but with area of encephalomalacia/gliosis in R temporal lobe. CT chest with large loculated hydropneumothorax on R chest and nonvisualization of RUL bronchus. Rapid response called 7/24 AM for concern for airway protection. Transferred to [facility] service.  SUBJECTIVE: No acute overnight events. Chest tube draining, remains intubated. Pressors off.   VITAL SIGNS: BP 109/72   Pulse 96   Temp 100.2 F (37.9 C) (Axillary)   Resp 20   Ht _0  (1.803 m)   Wt 131 lb 9.8 oz (59.7 kg)   SpO2 100%   BMI 18.36 kg/m   HEMODYNAMICS:    VENTILATOR SETTINGS: Vent Mode: PRVC FiO2 (%):  [40 %] 40 % Set Rate:  [12 bmp] 12 bmp Vt Set:  [530 mL] 530 mL PEEP:  [5 cmH20] 5 cmH20 Pressure Support:  [5 cmH20] 5 cmH20 Plateau Pressure:  [5 cmH20-16 cmH20] 12 cmH20  INTAKE / OUTPUT: I/O last 3 completed shifts: In: 4091.7 [I.V.:1528.4; Other:180; NG/GT:2233.3; IV Piggyback:150] Out: 2125 [Urine:1475; Stool:100; Chest Tube:550]  PHYSICAL EXAMINATION: General: 71yo M ill appearing, sedated Neuro: Eyes open, does not respond to voice or commands HEENT: AT/[place], PERRL, MMM Cardiovascular: RRR, no MRG Lungs: Lungs: coarse BS; minimal breath sounds R lung; R chest tube Abdomen: Soft, NTND Musculoskeletal: No gross deformities; diffuse muscle wasting Skin: Warm and dry  LABS:  BMET  Recent Labs Lab 01/12/17 1626 01/13/17 0435 01/14/17 0342  NA 146* 147* 149*  K 3.8 3.3*  3.4*  CL 121* 118* 116*  CO2 19* 24 27  BUN 33* 28* 23*  CREATININE 1.09 1.02 0.97  GLUCOSE 195* 199* 216*    Electrolytes  Recent Labs Lab 01/11/17 1648 01/12/17 0305 01/12/17 1626 01/13/17 0435 01/14/17 0342  CALCIUM  --  7.0* 6.7* 6.8* 7.0*  MG 1.9 1.6* 2.5*  --   --   PHOS 3.7 3.8 2.3*  --   --     CBC  Recent Labs Lab 01/12/17 0305 01/13/17 0435 01/14/17 0342  WBC 15.8* 5.4 5.4  HGB 10.3* 8.9* 8.9*  HCT 33.6* 28.7* 28.3*  PLT 158 86* 73*    Coag's  Recent Labs Lab 12/24/2016 1618  APTT 26  INR 1.15    Sepsis Markers  Recent Labs Lab 01/11/17 1220 01/11/17 1357  LATICACIDVEN 4.5* 2.6*    ABG  Recent Labs Lab 01/11/17 1124 01/12/17 1647  PHART 7.479* 7.429  PCO2ART 24.7* 28.1*  PO2ART 227.0* 111*    Liver Enzymes  Recent Labs Lab 12/31/2016 1618 01/11/17 1002 01/12/17 0305  AST 79* 80* 61*  ALT 46 53 39  ALKPHOS 75 63 51  BILITOT 2.0* 1.9* 2.0*  ALBUMIN 2.0* 1.7* 1.3*    Cardiac Enzymes  Recent Labs Lab 12/30/2016 2209 01/11/17 0316  TROPONINI 0.03* 0.03*    Glucose  Recent Labs Lab 01/13/17 0728 01/13/17 1114 01/13/17 1512 01/13/17 1916 01/13/17 2357 01/14/17 0309  GLUCAP 191* 166* 114* 207* 194* 183*    Imaging Dg Chest Port 1 View  Result Date: 01/14/2017 CLINICAL DATA:  Empyema. EXAM:  PORTABLE CHEST 1 VIEW COMPARISON:  01/13/2017. FINDINGS: Endotracheal tube, NG tube, right IJ line, right chest tube in stable position. Stable small right pneumothorax unchanged. Stable mild right pleural thickening again noted. Diffuse right lung infiltrate unchanged. Heart size normal. IMPRESSION: 1. Lines and tubes including right chest tube in stable position. Stable small right pneumothorax is unchanged. Stable mild right pleural thickening again noted . 2. Diffuse right lung infiltrate unchanged. Electronically Signed   By: Marcello Moores  Register   On: 01/14/2017 06:56     STUDIES: 7/23 CT head>>no acute intracranial  abnormalities. chronic area of encephalomalacia/gliosis in R temporal lobe, likely postoperative. Periapical abscess around L maxillary tooth. 7/23 CT chest>>large loculated R hydropneumothorax with atelectatic R lung and leftward mediastinal shift. Nonvisualization of RUL bronchus, suggesting central occluding lesion 7/25 MRI brain>1. No acute intracranial abnormality identified. 2. Right temporal lobe hemosiderin stained chronic encephalomalacia. 3. Moderate brain parenchymal volume loss. 4. Moderate diffuse paranasal sinus disease. 5. Prominent extra-axial CSF spaces, probably chronic hygroma, no significant mass effect on the brain. 7/25> ECHO: 55-60%, Trileaflet; moderately thickened, moderately calcified leaflets, G2DD and akinesis of the inferolateral myocardium 7/27> CXR: Diffuse right lung infiltrate unchanged.  CULTURES: 7/24 BAL rightCx>>>MOD GNR, GPR, GPC 7/24 R pleural fluid Cx>>>abundant GPC, GNR and viridans strep 7/24 Blood cultures x2>> NGTD  ANTIBIOTICS: Vanc 7/24>>>7/25 Zosyn 7/24>>>  SIGNIFICANT EVENTS: 7/23 admit to FMTS 7/24 rapid response, intubated, bronchoscopy, R chest tube placed with ~3L milky yellow pus drained  LINES/TUBES: 7/23 PIV left forearm>> 7/24 ETT>> 7/24 chest tube rt>> 7/24 CVC RIJ>>  ASSESSMENT / PLAN:  PULMONARY A: CT with large loculated R effusion, chest tube placed and revealing empyema draining 3L pus -BAL cultures/cytology sent Emergent intubation for airway protection/resp distress P: R chest tube- draining 400cc F/u BAL cultures and cytology as above CXR with diffuse slight lung infiltrate again noted. Stable See ID Extubate today Repeat CXR  CARDIOVASCULAR A:  EKG with no STEM, prolonged QT -AM EKG pending BNP 503 P: Telemetry  RENAL A:  AKI, resolved Hypernatremia, hyperchloremia, likely from dehydration hypokalemia P: Free water 100cc q6hrs Continue to follow BMP Replacing electrolytes  as above KCL 77mq q4hrs (2 doses)  GASTROINTESTINAL A:  AST to 79. History of liver disease/alcohol useunknown Low albumin with temporal muscle wasting, likely due to protein malnutrition P: protonix 457mdaily per tube Continue tube feeds at 65cc/hr; following refeeding labs Follow LFTs  HEMATOLOGIC A:  Hb stable P: Daily CBCs SCDs and subcutaneous heparin  INFECTIOUS A:  Afebrile WBC 15.8>5.4 Large loculated R empyema, now s/p R chest tube placement P: Cont zosyn F/u pleural & BAL, BXC  ENDOCRINE A:  No active issues P: Monitor blood glucose CBGs q4hrs, increase to rSSI  NEUROLOGIC A:  AMS, R facial droop, and generalized weakness, concerning for stroke. Last normal 7/22 PM. CT head neg, electrolytes mostly normal, TSH neg MRI brain showing no acute intracranial abnormality identified, chronic encephalomalacia at right temporal lobe Intubated P: RAS goal: 0/-1 Wean propofol; work on extubating today CIWA q4hrs, ativan 1-57m48mRN CIWA >8 Daily WUA  Neurology following; appreciate assistance -EEG showed no seizure activity; continue metabolic correction   Pulmonary and CriTonaleager: [phone]/27/2018, 7:10 AM

## 2017-01-14 NOTE — Progress Notes (Signed)
Family Medicine Social Note Patient still intubated and on pressure support. Per chart review patient progressing towards trial extubation.and being weaned off of pressure support. Patient noted to have dental abscess and noted to have chest tube culture growing strep viridans. Unclear if related. Agree with care from ICU team. Patient progressing well, expect to have him on floor within next couple of days.  Myrene BuddyJacob Tyjai Charbonnet MD PGY-1 Family Medicine Resident

## 2017-01-14 NOTE — Progress Notes (Signed)
eLink Physician-Brief Progress Note Patient Name: Jay HibbsJessie Botello DOB: 09/06/1945 MRN: [id number]   Date of Service  01/14/2017  HPI/Events of Note  Hypokalemia  eICU Interventions  Potassium replaced     Intervention Category Intermediate Interventions: Electrolyte abnormality - evaluation and management  DETERDING,ELIZABETH 01/14/2017, 5:50 AM

## 2017-01-14 NOTE — Procedures (Signed)
Extubation Procedure Note  Patient Details:   Name: Ferd HibbsJessie Easom DOB: 09/10/1945 MRN: [id number]   Airway Documentation:     Evaluation  O2 sats: stable throughout Complications: No apparent complications Patient did tolerate procedure well. Bilateral Breath Sounds: Clear, Diminished   Yes  Suzan GaribaldiCraddock, Rocko Fesperman Ann 01/14/2017, 10:12 AM

## 2017-01-15 ENCOUNTER — Inpatient Hospital Stay (HOSPITAL_COMMUNITY): Payer: Medicare Other

## 2017-01-15 ENCOUNTER — Encounter (HOSPITAL_COMMUNITY): Payer: Self-pay | Admitting: *Deleted

## 2017-01-15 DIAGNOSIS — E440 Moderate protein-calorie malnutrition: Secondary | ICD-10-CM

## 2017-01-15 LAB — GLUCOSE, CAPILLARY
GLUCOSE-CAPILLARY: 137 mg/dL — AB (ref 65–99)
GLUCOSE-CAPILLARY: 155 mg/dL — AB (ref 65–99)
Glucose-Capillary: 109 mg/dL — ABNORMAL HIGH (ref 65–99)
Glucose-Capillary: 138 mg/dL — ABNORMAL HIGH (ref 65–99)
Glucose-Capillary: 155 mg/dL — ABNORMAL HIGH (ref 65–99)

## 2017-01-15 LAB — CBC WITH DIFFERENTIAL/PLATELET
BASOS PCT: 1 %
Basophils Absolute: 0 10*3/uL (ref 0.0–0.1)
EOS ABS: 0 10*3/uL (ref 0.0–0.7)
Eosinophils Relative: 0 %
HEMATOCRIT: 39.7 % (ref 39.0–52.0)
Hemoglobin: 12.3 g/dL — ABNORMAL LOW (ref 13.0–17.0)
LYMPHS PCT: 24 %
Lymphs Abs: 0.9 10*3/uL (ref 0.7–4.0)
MCH: 25.8 pg — ABNORMAL LOW (ref 26.0–34.0)
MCHC: 31 g/dL (ref 30.0–36.0)
MCV: 83.2 fL (ref 78.0–100.0)
MONO ABS: 0.4 10*3/uL (ref 0.1–1.0)
Monocytes Relative: 11 %
NEUTROS ABS: 2.6 10*3/uL (ref 1.7–7.7)
Neutrophils Relative %: 64 %
Platelets: 80 10*3/uL — ABNORMAL LOW (ref 150–400)
RBC: 4.77 MIL/uL (ref 4.22–5.81)
RDW: 14.3 % (ref 11.5–15.5)
WBC: 3.9 10*3/uL — ABNORMAL LOW (ref 4.0–10.5)

## 2017-01-15 LAB — BASIC METABOLIC PANEL
Anion gap: 10 (ref 5–15)
BUN: 14 mg/dL (ref 6–20)
CALCIUM: 7.6 mg/dL — AB (ref 8.9–10.3)
CO2: 24 mmol/L (ref 22–32)
CREATININE: 0.85 mg/dL (ref 0.61–1.24)
Chloride: 115 mmol/L — ABNORMAL HIGH (ref 101–111)
GFR calc Af Amer: >60 mL/min (ref >60)
GFR calc non Af Amer: >60 mL/min (ref >60)
GLUCOSE: 129 mg/dL — AB (ref 65–99)
Potassium: 4.2 mmol/L (ref 3.5–5.1)
Sodium: 149 mmol/L — ABNORMAL HIGH (ref 135–145)

## 2017-01-15 MED ORDER — PRO-STAT SUGAR FREE PO LIQD
30.0000 mL | Freq: Every day | ORAL | Status: DC
  Administered 2017-01-16 – 2017-01-17 (×2): 30 mL
  Filled 2017-01-15 (×3): qty 30

## 2017-01-15 MED ORDER — JEVITY 1.2 CAL PO LIQD
1000.0000 mL | ORAL | Status: DC
  Filled 2017-01-15 (×5): qty 1000

## 2017-01-15 MED ORDER — CEFAZOLIN SODIUM-DEXTROSE 1-4 GM/50ML-% IV SOLN
1.0000 g | Freq: Three times a day (TID) | INTRAVENOUS | Status: DC
  Administered 2017-01-15 – 2017-01-21 (×19): 1 g via INTRAVENOUS
  Filled 2017-01-15 (×20): qty 50

## 2017-01-15 MED ORDER — LABETALOL HCL 5 MG/ML IV SOLN: 10.0000 mg | INTRAVENOUS | Status: DC | PRN

## 2017-01-15 NOTE — Progress Notes (Signed)
Attempted report. Nurse will call back. Huntley EstelleLewis, Piotr Christopher E, RN 01/15/2017 1:05 PM

## 2017-01-15 NOTE — Progress Notes (Signed)
Pt arrived to the unit.Condition stable[facility]. Assessment unchanged at time of arrival. Placed om tele and verified. Chest tube to suction. Has milky color drainage. Cont with plan of care

## 2017-01-15 NOTE — Evaluation (Signed)
Clinical/Bedside Swallow Evaluation Patient Details  Name: Jay HibbsJessie Stephens MRN: [id number] Date of Birth: 10/25/1945  Today's Date: 01/15/2017 Time: SLP Start Time (ACUTE ONLY): 0930 SLP Stop Time (ACUTE ONLY): 0942 SLP Time Calculation (min) (ACUTE ONLY): 12 min  Past Medical History: History reviewed. No pertinent past medical history. Past Surgical History: No past surgical history on file. HPI:  [name] is a 71yo male with unknown PMH per EMR [facility], R facial droop, and generalized weakness. Last seen normal by nephew 7/22 PM. Per nephew, apparently A&Ox4 at baselineand able to someADLs but requires some assistance. CT head negative for stroke but with area of encephalomalacia/gliosis in R temporal lobe. CT chest with large loculated hydropneumothorax on R chest and nonvisualization of RUL bronchus. Intubated 7/24-7/27. CXR 7/28 showed diffuse right lung infiltrate.   Assessment / Plan / Recommendation Clinical Impression  Pt presents with what appears to be an acute, reversible dysphagia s/p 4 day intubation. He is currently at severe risk for aspiration given his clinical signs of aspiration, decreased respiratory status and current mentation. Pt is alert, responding verbally though his speech is largely unintelligible to this trained listener given low vocal amplitude, hoarseness and impaired articulation (pt is edentulous). Inconsistently follows commands; unable to perform thorough oral motor examination. He presents with immediate coughing following sips of thin water, respiratory rate rises in mid-upper 30s following trials of ice, thin liquids and purees, suggestive of reduced airway protection. Given his high respiratory rate (flucuating from mid 20s to low 30s at baseline), pt not yet ready for instrumental assessment. Pt may need temporary alternative means of nutrition/hydration. Recommend he remain NPO at this time. SLP will follow up next date for  improvements at bedside, instrumental examination (MBS or FEES) if appropriate.  SLP Visit Diagnosis: Dysphagia, oropharyngeal phase (R13.12)    Aspiration Risk  Severe aspiration risk;Risk for inadequate nutrition/hydration    Diet Recommendation NPO;Alternative means - temporary        Other  Recommendations Oral Care Recommendations: Oral care QID Other Recommendations: Remove water pitcher;Have oral suction available   Follow up Recommendations Other (comment) (TBD)      Frequency and Duration min 2x/week  2 weeks       Prognosis Prognosis for Safe Diet Advancement: Good Barriers to Reach Goals: Cognitive deficits      Swallow Study   General Date of Onset: 01/03/2017 HPI: [name] is a 71yo male with unknown PMH per EMR [facility], R facial droop, and generalized weakness. Last seen normal by nephew 7/22 PM. Per nephew, apparently A&Ox4 at baselineand able to someADLs but requires some assistance. CT head negative for stroke but with area of encephalomalacia/gliosis in R temporal lobe. CT chest with large loculated hydropneumothorax on R chest and nonvisualization of RUL bronchus. Intubated 7/24-7/27. CXR 7/28 showed diffuse right lung infiltrate. Type of Study: Bedside Swallow Evaluation Previous Swallow Assessment: none in chart Diet Prior to this Study: NPO Temperature Spikes Noted: No Respiratory Status: Nasal cannula History of Recent Intubation: Yes Length of Intubations (days): 4 days Date extubated: 01/14/17 Behavior/Cognition: Alert;Confused;Impulsive Oral Cavity Assessment: Within Functional Limits Oral Care Completed by SLP: Yes Oral Cavity - Dentition: Edentulous Vision: Functional for self-feeding Self-Feeding Abilities: Needs assist Patient Positioning: Upright in bed Baseline Vocal Quality: Low vocal intensity;Hoarse Volitional Cough: Weak Volitional Swallow: Unable to elicit    Oral/Motor/Sensory Function Overall Oral  Motor/Sensory Function: Other (comment) (does not follow commands  for oral motor examination)   Ice Chips Ice chips: Within functional limits   Thin Liquid Thin Liquid: Impaired Pharyngeal  Phase Impairments: Cough - Immediate;Change in Vital Signs;Suspected delayed Swallow    Nectar Thick Nectar Thick Liquid: Not tested   Honey Thick     Puree Puree: Impaired Pharyngeal Phase Impairments: Change in Vital Signs;Suspected delayed Swallow   Solid   GO   Shon HaleMary Beth CrestonBardin, TennesseeMS, CCC-SLP Speech-Language Pathologist [phone] Solid: Not tested        Arlana LindauMary E Calais Svehla 01/15/2017,10:13 AM

## 2017-01-15 NOTE — Progress Notes (Signed)
Cortrak Tube Team Note:  Consult received to place a Cortrak feeding tube.   A 10 F Cortrak tube was placed in the L nare and secured with a nasal bridle at 63 cm. Per the Cortrak monitor reading the tube tip is in gastric body .  X-ray is required, abdominal x-ray has been ordered by the Cortrak team. Please confirm tube placement before using the Cortrak tube.   If the tube becomes dislodged please keep the tube and contact the Cortrak team at [email or web address] (password [email or web address]) for replacement.  If after hours and replacement cannot be delayed, place a NG tube and confirm placement with an abdominal x-ray.    Christophe LouisNathan Franks RD, LDN, CNSC Clinical Nutrition Pager: [phone] 01/15/2017 12:00 PM
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
## 2017-01-15 NOTE — Progress Notes (Signed)
PULMONARY / CRITICAL CARE MEDICINE   Name: Jay Stephens MRN: [id number] DOB: Feb 08, 1946    ADMISSION DATE:01/15/2017 CONSULTATION DATE:01/11/2017  REFERRING GX:QJJHERDE  CHIEF COMPLAINT:AMS  HISTORY OF PRESENT ILLNESS: 71 yo male with aspiration PNA, Rt empyema, VDRF, septic shock.  SUBJECTIVE: Denies chest pain.  VITAL SIGNS: BP (!) 152/88   Pulse 84   Temp (!) 97.5 F (36.4 C) (Oral)   Resp (!) 23   Ht _0  (1.803 m)   Wt 140 lb 14 oz (63.9 kg)   SpO2 100%   BMI 19.65 kg/m   INTAKE / OUTPUT: I/O last 3 completed shifts: In: 2923.9 [P.O.:360; I.V.:1113.5; Other:90; NG/GT:1210.4; IV Piggyback:150] Out: 2635 [Urine:1540; Stool:415; Chest Tube:680]  PHYSICAL EXAMINATION:  General - cachectic Eyes - pupils reactive ENT - no sinus tenderness, no oral exudate, no LAN Cardiac - regular, no murmur Chest - no wheeze, rales, Rt chest tube in place Abd - soft, non tender Ext - no edema Skin - no rashes Neuro - moves extremities  LABS:  BMET  Recent Labs Lab 01/12/17 1626 01/13/17 0435 01/14/17 0342  NA 146* 147* 149*  K 3.8 3.3* 3.4*  CL 121* 118* 116*  CO2 19* 24 27  BUN 33* 28* 23*  CREATININE 1.09 1.02 0.97  GLUCOSE 195* 199* 216*    Electrolytes  Recent Labs Lab 01/11/17 1648 01/12/17 0305 01/12/17 1626 01/13/17 0435 01/14/17 0342  CALCIUM  --  7.0* 6.7* 6.8* 7.0*  MG 1.9 1.6* 2.5*  --   --   PHOS 3.7 3.8 2.3*  --   --     CBC  Recent Labs Lab 01/12/17 0305 01/13/17 0435 01/14/17 0342  WBC 15.8* 5.4 5.4  HGB 10.3* 8.9* 8.9*  HCT 33.6* 28.7* 28.3*  PLT 158 86* 73*    Coag's  Recent Labs Lab 01/06/2017 1618  APTT 26  INR 1.15    Sepsis Markers  Recent Labs Lab 01/11/17 1220 01/11/17 1357  LATICACIDVEN 4.5* 2.6*    ABG  Recent Labs Lab 01/11/17 1124 01/12/17 1647  PHART 7.479* 7.429  PCO2ART 24.7* 28.1*  PO2ART 227.0* 111*    Liver Enzymes  Recent Labs Lab 12/29/2016 1618 01/11/17 1002  01/12/17 0305  AST 79* 80* 61*  ALT 46 53 39  ALKPHOS 75 63 51  BILITOT 2.0* 1.9* 2.0*  ALBUMIN 2.0* 1.7* 1.3*    Cardiac Enzymes  Recent Labs Lab 01/09/2017 2209 01/11/17 0316  TROPONINI 0.03* 0.03*    Glucose  Recent Labs Lab 01/14/17 1057 01/14/17 1510 01/14/17 1920 01/14/17 2311 01/15/17 0318 01/15/17 0725  GLUCAP 199* 158* 132* 117* 137* 109*    Imaging Dg Chest Port 1 View  Result Date: 01/15/2017 CLINICAL DATA:  Empyema, extubation EXAM: PORTABLE CHEST 1 VIEW COMPARISON:  01/14/2017 FINDINGS: Interval removal of endotracheal tube, right central line and NG tube. Diffuse airspace disease throughout the right lung is stable. Small right apical pneumothorax, 5-10% slightly decreased since prior study. Right basilar chest tube remains in place. Mild cardiomegaly. No confluent opacity on the left. IMPRESSION: Small right apical pneumothorax, slightly decreased since prior study. Continued diffuse right lung airspace disease. Electronically Signed   By: Rolm Baptise M.D.   On: 01/15/2017 08:00     STUDIES: 7/23 CT head>>no acute intracranial abnormalities. chronic area of encephalomalacia/gliosis in R temporal lobe, likely postoperative. Periapical abscess around L maxillary tooth. 7/23 CT chest>>large loculated R hydropneumothorax with atelectatic R lung and leftward mediastinal shift. Nonvisualization of RUL bronchus, suggesting central occluding lesion  7/25 MRI brain>1. No acute intracranial abnormality identified. 2. Right temporal lobe hemosiderin stained chronic encephalomalacia. 3. Moderate brain parenchymal volume loss. 4. Moderate diffuse paranasal sinus disease. 5. Prominent extra-axial CSF spaces, probably chronic hygroma, no significant mass effect on the brain. 7/25> ECHO: 55-60%, Trileaflet; moderately thickened, moderately calcified leaflets, G2DD and akinesis of the inferolateral myocardium  CULTURES: 7/24 BAL rightCx>>>multiple  organisms 7/24 R pleural fluid Cx>>> Viridans Streptococcus 7/24 Blood cultures x2>>>  ANTIBIOTICS: Vanc 7/24>>>7/25 Zosyn 7/24>>>  SIGNIFICANT EVENTS: 7/23 admit to [facility]MTS 7/24 rapid response, intubated, bronchoscopy, R chest tube placed with ~3L milky yellow pus drained  LINES/TUBES: 7/24 ETT >> 7/27 7/24 chest tube rt >>  ASSESSMENT / PLAN:  Acute hypoxic respiratory failure. Aspiration pneumonia. Viridans Streptococcus Rt empyema. - day 5 of zosyn - f/u CXR - continue chest tube to -20 suction  Hypertension. - PRN labetalol  Hypernatremia. Hypokalemia. - D5 1/2 NS with 20 KCL at 75 ml/hr - replace K  Severe protein calorie malnutrition. - speech to assess swallowing - coretrak with tube feeds  Anemia of critical illness. Thrombocytopenia from sepsis. - f/u CBC  Acute metabolic encephalopathy. Rt facial droop >> no evidence for CVA. ?hx of ETOH. - monitor mental status - thiamine, folic acid - CIWA with prn ativan >> monitor on tele  Deconditioning. - PT/OT recommending SNF  DVT prophylaxis - SQ heparin SUP - not indicated  Nutrition - NPO Goals of care - full code  Transfer to tele bed 7/29.  Chesley Mires, MD [facility] Pulmonary/Critical Care 01/15/2017, 10:30 AM Pager:  [phone] After 3pm call: [phone]

## 2017-01-15 NOTE — Progress Notes (Signed)
Pharmacy Antibiotic Note  Ferd HibbsJessie Kroeger is a 71 y.o. male admitted on 01/13/2017 with strep viridans in empyema. Today is day #5 of antibiotics with Zosyn. Pharmacy has been consulted to switch to cefazolin for pan sensitive strep viridans. CrCl ~ 75 mL/min.   Plan: Start cefazolin 1 gm IV Q 8 hours  Pharmacy to sign off since no further dosage adjustments necessary.   Height: 5\' 11"  (180.3 cm) Weight: 140 lb 14 oz (63.9 kg) IBW/kg (Calculated) : 75.3  Temp (24hrs), Avg:98.1 F (36.7 C), Min:97.5 F (36.4 C), Max:98.9 F (37.2 C)   Recent Labs Lab 01/11/17 1002 01/11/17 1220 01/11/17 1357 01/12/17 0305 01/12/17 1626 01/13/17 0435 01/14/17 0342 01/15/17 0910  WBC 13.5*  --   --  15.8*  --  5.4 5.4 3.9*  CREATININE 1.35*  --   --  1.35* 1.09 1.02 0.97 0.85  LATICACIDVEN  --  4.5* 2.6*  --   --   --   --   --     Estimated Creatinine Clearance: 74.1 mL/min (by C-G formula based on SCr of 0.85 mg/dL).    No Known Allergies  Antimicrobials this admission: Zosyn 7/24 >> 7/28 Ancef 7/28 >>   Dose adjustments this admission: None  Microbiology results: 7/24 Body fluid: Strep viridans    Thank you for allowing pharmacy to be a part of this patient's care.  Vinnie LevelBenjamin Mycheal Veldhuizen, PharmD., BCPS Clinical Pharmacist Pager [phone]

## 2017-01-15 NOTE — Progress Notes (Signed)
Pt. With hand mitts in place  Has managed to dislodge core track. Found by CN. Page in to MD. Awaiting reply.

## 2017-01-15 NOTE — Evaluation (Signed)
Physical Therapy Evaluation Patient Details Name: Jay Stephens MRN: [id number] DOB: 04/04/1946 Today's Date: 01/15/2017   History of Present Illness  71 year old admitted with loculated hydropneumothorax, altered mental status,noted to have possible right facial droop and right-sided weakness; neuro workup underway. Noted R side hydropneumothorax  Clinical Impression  Orders received for PT evaluation. Patient demonstrates deficits in functional mobility as indicated below. Will benefit from continued skilled PT to address deficits and maximize function. Will see as indicated and progress as tolerated.  Currently, patient requiring increased 2 person physical assist for mobility and was unable to tolerate or perform ambulation or OOB to chair. Recommend SNF upon acute discharge.    Follow Up Recommendations SNF;Supervision/Assistance - 24 hour    Equipment Recommendations   (TBD)    Recommendations for Other Services       Precautions / Restrictions Precautions Precautions: Fall Precaution Comments: right chest tube Restrictions Weight Bearing Restrictions: No      Mobility  Bed Mobility Overal bed mobility: Needs Assistance Bed Mobility: Supine to Sit;Sit to Supine     Supine to sit: Mod assist;+2 for physical assistance Sit to supine: Mod assist   General bed mobility comments: increased time and effort, multi modal cues to direct to task of bringing LEs to EOB. Assist to elevate trunk to upright and maintain EOB. Assist to elevate LEs back to bed  Transfers Overall transfer level: Needs assistance Equipment used: 2 person hand held assist (wrap around with gait belt support) Transfers: Sit to/from Stand Sit to Stand: Max assist;+2 physical assistance         General transfer comment: Attempted sit <> stand x3 max assist 2 persons to come to Upright unable to faciliatate posture to maintain upright at this time  Ambulation/Gait                Stairs            Wheelchair Mobility    Modified Rankin (Stroke Patients Only) Modified Rankin (Stroke Patients Only) Pre-Morbid Rankin Score: No symptoms Modified Rankin: Severe disability     Balance Overall balance assessment: Needs assistance Sitting-balance support: Feet supported;Bilateral upper extremity supported Sitting balance-Leahy Scale: Poor Sitting balance - Comments: min guard to min assist at EOB Postural control: Posterior lean Standing balance support: Bilateral upper extremity supported Standing balance-Leahy Scale: Zero Standing balance comment: unable to reach full upright position despite +2 max assist                             Pertinent Vitals/Pain Pain Assessment: Faces Pain Score: 0-No pain Faces Pain Scale: No hurt    Home Living Family/patient expects to be discharged to:: Skilled nursing facility Living Arrangements: Other relatives                    Prior Function           Comments: unknown     Hand Dominance        Extremity/Trunk Assessment   Upper Extremity Assessment Upper Extremity Assessment: Generalized weakness    Lower Extremity Assessment Lower Extremity Assessment: Generalized weakness;RLE deficits/detail RLE Deficits / Details: RLE appears grossly weaker than left, moving spontaneously and intermittently to command RLE Coordination: decreased fine motor;decreased gross motor    Cervical / Trunk Assessment Cervical / Trunk Assessment: Kyphotic  Communication   Communication: HOH  Cognition Arousal/Alertness: Awake/alert Behavior During Therapy: WFL for tasks assessed/performed Overall Cognitive Status: No family/caregiver  present to determine baseline cognitive functioning Area of Impairment: Orientation;Attention;Safety/judgement;Awareness;Following commands;Problem solving                 Orientation Level: Disoriented to;Place;Time;Situation Current Attention Level: Focused   Following  Commands: Follows one step commands inconsistently;Follows one step commands with increased time Safety/Judgement: Decreased awareness of safety;Decreased awareness of deficits Awareness: Intellectual Problem Solving: Slow processing;Decreased initiation;Difficulty sequencing;Requires verbal cues;Requires tactile cues        General Comments      Exercises     Assessment/Plan    PT Assessment Patient needs continued PT services  PT Problem List Decreased strength;Decreased activity tolerance;Decreased balance;Decreased mobility;Decreased cognition;Decreased coordination;Decreased safety awareness       PT Treatment Interventions DME instruction;Gait training;Functional mobility training;Therapeutic activities;Therapeutic exercise;Balance training;Neuromuscular re-education;Cognitive remediation;Patient/family education    PT Goals (Current goals can be found in the Care Plan section)  Acute Rehab PT Goals Patient Stated Goal: none stated PT Goal Formulation: Patient unable to participate in goal setting Time For Goal Achievement: 01/29/17 Potential to Achieve Goals: Fair    Frequency Min 2X/week   Barriers to discharge        Co-evaluation               AM-PAC PT "6 Clicks" Daily Activity  Outcome Measure Difficulty turning over in bed (including adjusting bedclothes, sheets and blankets)?: Total Difficulty moving from lying on back to sitting on the side of the bed? : Total Difficulty sitting down on and standing up from a chair with arms (e.g., wheelchair, bedside commode, etc,.)?: Total Help needed moving to and from a bed to chair (including a wheelchair)?: Total Help needed walking in hospital room?: Total Help needed climbing 3-5 steps with a railing? : Total 6 Click Score: 6    End of Session   Activity Tolerance: Patient limited by fatigue Patient left: in bed;with call bell/phone within reach;with bed alarm set;with SCD's reapplied Nurse  Communication: Mobility status PT Visit Diagnosis: Unsteadiness on feet (R26.81);Muscle weakness (generalized) (M62.81);Adult, failure to thrive (R62.7)    Time: 0810-0835 PT Time Calculation (min) (ACUTE ONLY): 25 min   Charges:   PT Evaluation $PT Eval Moderate Complexity: 1 Procedure PT Treatments $Therapeutic Activity: 8-22 mins   PT G Codes:        Charlotte Crumbevon Renella Steig, PT DPT  Board Certified Neurologic Specialist [phone]   Fabio AsaDevon J Tashanna Dolin 01/15/2017, 10:18 AM

## 2017-01-15 NOTE — Progress Notes (Signed)
Family Medicine Teaching Service Daily Progress Note Intern Pager: [phone]  Patient name: Jay HibbsJessie Stephens Medical record number: [id number] Date of birth: 02/10/1946 Age: 71 y.o. Gender: male  Primary Care Provider: System, Pcp Not In Consultants: CCM, Neurology Code Status: FULL  Pt Overview and Major Events to Date:  Admitted on 01/06/2017 for AMS.  Intubated d/t respiratory distress and transferred to CCM on 7/24.  Extubated and transferred to FPTS on 01/14/17.  Assessment and Plan:  Jay Stephens is a 71 y.o. male with unknown PMH presenting with apparent altered mental status, R facial droop, and generalized weakness, found to have empyema in right side of chest  Altered Mental Status with R facial droop and weakness:  Last seen normal by his nephew in pm of 7/22. Patient apparently AAOx4 and able to do all ADLs at baseline, although physical appearance does not suggest this. Patient with no known medical history.  GCS 13 at current time. CT scan (-) for acute stroke, but is positive for partial craniotomy with gliosis in right temporal lobe. UDS and alcohol (-).  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  TSH was wnl.  EEG negative for seizure activity.  Neurology has been following patient and believe his AMS is likely d/t toxic-metabolic encephalopathy.  They believe that his right-sided weakness and facial droop is likely the return of old symptoms due to his current critical illness.  Blood cultures negative x 4 days.   - Vitals signs per step-down unit, pulse ox with vitals - daily BMP, CBC - continue give B1, folic acid - continue to monitor mental status - OT/PT/SLP following - NPO -Ativan 1-4 mg Q4H PRN for CIWA > 8 - continue Zosyn - D51/2NS with 20 mEq/L KCl - will D/C mitts if able today  Right sided hydropneumothorax: CT chest confirmed large loculated hydropneumothorax on right chest as well as  nonvisualization right upper lobe bronchus concerning for obstruction.  Chest tube placed on 7/24 drained 3L pus from right chest empyema.  Culture of empyema fluid positive for numerous Strep viridans bacteria, which could be related to his tooth abscess.  Extubated successfully on 01/14/17.  Repeat CXR on 01/15/17 shows improved right apical pneumothorax.  WBC is 5.4.  Patient remains afebrile. - monitor respiratory status - [place] as needed to keep sats above 92%  Electrolyte abnormalities; Hypernatremia, hyperchloremia: Likely from dehydration, although electrolytes have been stable and remain elevated even with continued hydration.  Sodium 149, Chloride 116 on 7/28. - Monitor for s/s of hypernatremia and hyperchloremia - Daily BMP - D51/2NS @ 50 cc/hr  Hypocalcemia Patient with low ionized calcium. Can replete with calcium gluconate if needed, although will opt to just watch for now. Serum calcium low at 8.8 but is 9.9 when corrected for low albumin.  - daily bmp   Protein Calorie Malnutrition  Patient with albumin of 2.0 on admission and last albumin measured on 7/25 of 1.3, marked temporal wasting, weight of 100lbs. Patient very thin and very frail looking on exam. Will need protein supplementation. IV fluids for now given NPO status. - Nutrition consult - fluids as above  Hyperuremia Patient with bun of 38 on admission, now 23. Most likely due to muscle wasting  - will continue to watch - daily bmp - fluids as above  Elevated Transaminases Elevated AST up to 79 on admission, now 61. Patient with unknown history of alcohol use or any liver disorder.  -Will continue to  trend  Tooth abscess: Poor dentition with periapical abscess around a left maxillary tooth (likely tooth number 11) seen on head CT.  Could be source of strep viridans in empyema. -dental consult?  PMH is unknown  FEN/GI: NPO Prophylaxis: pantoprazole, SCDs   Disposition: SNF, will work with CSW on  arranging this  Subjective:  Patient denies pain or discomfort.  He is able to tell me his name and says that we are in [place], [place].  Objective: Temp:  [97.5 F (36.4 C)-98.9 F (37.2 C)] 97.5 F (36.4 C) (07/28 0724) Pulse Rate:  [83-101] 84 (07/28 0800) Resp:  [0-34] 23 (07/28 0800) BP: (109-164)/(65-105) 152/88 (07/28 0800) SpO2:  [84 %-100 %] 100 % (07/28 0800) Weight:  [140 lb 14 oz (63.9 kg)] 140 lb 14 oz (63.9 kg) (07/28 0356) Physical Exam: General: thin, frail, chronically-ill appearing man with temporal wasting Cardiovascular: RRR, no MRG Respiratory: coarse rhonchi bilaterally, chest tube in place, draining serosanguinous fluid Abdomen: +bowel sounds, nontender Extremities: thin, no lesions noted  Laboratory:  Recent Labs Lab 01/12/17 0305 01/13/17 0435 01/14/17 0342  WBC 15.8* 5.4 5.4  HGB 10.3* 8.9* 8.9*  HCT 33.6* 28.7* 28.3*  PLT 158 86* 73*    Recent Labs Lab 12/27/2016 1618  01/11/17 1002 01/12/17 0305 01/12/17 1626 01/13/17 0435 01/14/17 0342  NA 146*  < > 149* 149* 146* 147* 149*  K 4.3  < > 4.8 3.6 3.8 3.3* 3.4*  CL 113*  < > 121* 126* 121* 118* 116*  CO2 19*  --  13* 15* 19* 24 27  BUN 39*  < > 50* 42* 33* 28* 23*  CREATININE 1.45*  < > 1.35* 1.35* 1.09 1.02 0.97  CALCIUM 8.3*  --  7.6* 7.0* 6.7* 6.8* 7.0*  PROT 6.7  --  6.0* 4.7*  --   --   --   BILITOT 2.0*  --  1.9* 2.0*  --   --   --   ALKPHOS 75  --  63 51  --   --   --   ALT 46  --  53 39  --   --   --   AST 79*  --  80* 61*  --   --   --   GLUCOSE 213*  < > 186* 130* 195* 199* 216*  < > = values in this interval not displayed.   Imaging/Diagnostic Tests: Mr Brain 7 Contrast  Result Date: 01/12/2017 CLINICAL DATA:  71 y/o M; altered mental status, right facial droop, generalized weakness. EXAM: MRI HEAD WITHOUT CONTRAST TECHNIQUE: Multiplanar, multiecho pulse sequences of the brain and surrounding structures were obtained without intravenous contrast. COMPARISON:  None.  FINDINGS: Brain: Right temporal lobe hemosiderin stained chronic encephalomalacia. No reduced diffusion to suggest acute or early subacute infarction. Moderate diffuse brain parenchymal volume loss. Symmetric increased extra-axial spaces over convexities and cerebellum following CSF on all sequences, probably in chronic hygroma, no significant mass effect on the underlying brain. Vascular: Persistent central flow voids. Skull and upper cervical spine: Normal marrow signal. Sinuses/Orbits: Moderate diffuse paranasal sinus mucosal thickening greater on the right. Trace left mastoid effusion. Orbits are unremarkable. Other: None. IMPRESSION: 1. No acute intracranial abnormality identified. 2. Right temporal lobe hemosiderin stained chronic encephalomalacia. 3. Moderate brain parenchymal volume loss. 4. Moderate diffuse paranasal sinus disease. 5. Prominent extra-axial CSF spaces, probably chronic hygroma, no significant mass effect on the brain. Electronically Signed   By: Mitzi Hansen M.D.   On: 01/12/2017 04:18  Dg Chest Port 1 View  Result Date: 01/15/2017 CLINICAL DATA:  Empyema, extubation EXAM: PORTABLE CHEST 1 VIEW COMPARISON:  01/14/2017 FINDINGS: Interval removal of endotracheal tube, right central line and NG tube. Diffuse airspace disease throughout the right lung is stable. Small right apical pneumothorax, 5-10% slightly decreased since prior study. Right basilar chest tube remains in place. Mild cardiomegaly. No confluent opacity on the left. IMPRESSION: Small right apical pneumothorax, slightly decreased since prior study. Continued diffuse right lung airspace disease. Electronically Signed   By: Charlett NoseKevin  Dover M.D.   On: 01/15/2017 08:00   Dg Chest Port 1 View  Result Date: 01/14/2017 CLINICAL DATA:  Empyema. EXAM: PORTABLE CHEST 1 VIEW COMPARISON:  01/13/2017. FINDINGS: Endotracheal tube, NG tube, right IJ line, right chest tube in stable position. Stable small right pneumothorax  unchanged. Stable mild right pleural thickening again noted. Diffuse right lung infiltrate unchanged. Heart size normal. IMPRESSION: 1. Lines and tubes including right chest tube in stable position. Stable small right pneumothorax is unchanged. Stable mild right pleural thickening again noted . 2. Diffuse right lung infiltrate unchanged. Electronically Signed   By: Maisie Fushomas  Register   On: 01/14/2017 06:56   Dg Chest Port 1 View  Result Date: 01/13/2017 CLINICAL DATA:  Intubation. EXAM: PORTABLE CHEST 1 VIEW COMPARISON:  01/12/2017. FINDINGS: Endotracheal tube has been repositioned and its tip is 4 cm above the carina. NG tube, right IJ line stable position. Right chest tube in stable position. Right pneumothorax stable. Diffuse right lung infiltrate, slightly improved from prior exam. Low lung volumes with basilar atelectasis. Stable right pleural thickening. Heart size stable. IMPRESSION: 1. Endotracheal tube is been repositioned, its tip is in good anatomic position 4 cm above the carina . Right IJ line and NG tube in stable position . Right chest tube in stable position. Stable right pneumothorax. Stable right pleural thickening. 2. Diffuse slight lung infiltrate again noted. Slight improvement from prior exam. Electronically Signed   By: Maisie Fushomas  Register   On: 01/13/2017 06:25   Portable Chest Xray  Result Date: 01/12/2017 CLINICAL DATA:  Respiratory failure. EXAM: PORTABLE CHEST 1 VIEW COMPARISON:  01/11/2017 . FINDINGS: Right chest tube in stable position. Stable right sided pneumothorax. Right IJ line and NG tube in stable position. Endotracheal tube tip 1 cm above the carina. Proximal repositioning of 2 cm should be considered. Heart size stable. Diffuse right lung infiltrate again noted without interim change. Stable small right pleural effusion. IMPRESSION: 1. Right chest tube in stable position. Stable small right pneumothorax. Stable small right pleural effusion. 2. Endotracheal tube 1 cm above  the carina. Proximal repositioning of approximately 2 cm should be considered. Right IJ line and NG tube in stable position. 3.  Diffuse right lung infiltrate again noted.  No interim change. Electronically Signed   By: Maisie Fushomas  Register   On: 01/12/2017 06:29   Dg Chest Port 1 View  Result Date: 01/11/2017 CLINICAL DATA:  Central line placement EXAM: PORTABLE CHEST 1 VIEW COMPARISON:  01/11/2017, 01/11/2017, 12/24/2016 FINDINGS: Endotracheal tube tip is just above the carina. Esophageal tube tip in the left upper quadrant. Placement of right-sided central venous catheter with tip overlying the SVC. Small moderate right apical pneumothorax re- demonstrated without significant interval change, 2.5 cm pleural-parenchymal separation at the apex. Right lower chest tube is in place with slight angulated appearance at the right lower lateral chest wall. Small residual right pleural effusion. Clear left lung. Stable cardiomediastinal silhouette. IMPRESSION: 1. Right-sided central venous catheter tip overlies  the distal SVC 2. Endotracheal tube tip near the carina 3. No change in right pneumothorax or small right-sided pleural effusion. Slight angulated appearance of right lower chest drainage catheter at the lateral lower chest wall. Electronically Signed   By: Jasmine Pang M.D.   On: 01/11/2017 15:58   Dg Chest Port 1 View  Result Date: 01/11/2017 CLINICAL DATA:  Chest tube placement EXAM: PORTABLE CHEST 1 VIEW COMPARISON:  01/11/2017 . FINDINGS: Interim placement right chest tube, its tip is over the right lower chest and incompletely imaged. Interval removal of right pleural effusion Right-sided pneumothorax again noted without significant change. Right lung is clear. IMPRESSION: 1. Interval placement of right chest tube. Interval removal of right pleural effusion. Stable right pneumothorax. Right lung is clear. 2. Endotracheal tube and NG tube in stable position . Electronically Signed   By: Maisie Fus  Register    On: 01/11/2017 11:12    Lennox Solders, MD 01/15/2017, 8:52 AM PGY-1, [facility] Family Medicine FPTS Intern pager: [phone], text pages welcome

## 2017-01-15 NOTE — Progress Notes (Addendum)
Informed Dr. Darrick Pennaeterding patient had a 13 beat run of V-tach @ 0022.  Pt asymptomatic and sleeping.  VSS.  No new orders.

## 2017-01-16 ENCOUNTER — Inpatient Hospital Stay (HOSPITAL_COMMUNITY): Payer: Medicare Other

## 2017-01-16 ENCOUNTER — Encounter (HOSPITAL_COMMUNITY): Payer: Self-pay | Admitting: *Deleted

## 2017-01-16 DIAGNOSIS — E43 Unspecified severe protein-calorie malnutrition: Secondary | ICD-10-CM

## 2017-01-16 DIAGNOSIS — Z9689 Presence of other specified functional implants: Secondary | ICD-10-CM

## 2017-01-16 LAB — BASIC METABOLIC PANEL
ANION GAP: 7 (ref 5–15)
BUN: 14 mg/dL (ref 6–20)
CHLORIDE: 113 mmol/L — AB (ref 101–111)
CO2: 22 mmol/L (ref 22–32)
Calcium: 7.1 mg/dL — ABNORMAL LOW (ref 8.9–10.3)
Creatinine, Ser: 0.9 mg/dL (ref 0.61–1.24)
GFR calc non Af Amer: >60 mL/min (ref >60)
Glucose, Bld: 194 mg/dL — ABNORMAL HIGH (ref 65–99)
POTASSIUM: 4.7 mmol/L (ref 3.5–5.1)
SODIUM: 142 mmol/L (ref 135–145)

## 2017-01-16 LAB — GLUCOSE, CAPILLARY
GLUCOSE-CAPILLARY: 178 mg/dL — AB (ref 65–99)
GLUCOSE-CAPILLARY: 179 mg/dL — AB (ref 65–99)
GLUCOSE-CAPILLARY: 183 mg/dL — AB (ref 65–99)
GLUCOSE-CAPILLARY: 221 mg/dL — AB (ref 65–99)
Glucose-Capillary: 203 mg/dL — ABNORMAL HIGH (ref 65–99)
Glucose-Capillary: 190 mg/dL — ABNORMAL HIGH (ref 65–99)

## 2017-01-16 LAB — CBC
HCT: 34.2 % — ABNORMAL LOW (ref 39.0–52.0)
Hemoglobin: 11 g/dL — ABNORMAL LOW (ref 13.0–17.0)
MCH: 26.8 pg (ref 26.0–34.0)
MCHC: 32.2 g/dL (ref 30.0–36.0)
MCV: 83.2 fL (ref 78.0–100.0)
Platelets: 71 10*3/uL — ABNORMAL LOW (ref 150–400)
RBC: 4.11 MIL/uL — AB (ref 4.22–5.81)
RDW: 14.5 % (ref 11.5–15.5)
WBC: 4.2 10*3/uL (ref 4.0–10.5)

## 2017-01-16 LAB — CULTURE, BLOOD (ROUTINE X 2)
CULTURE: NO GROWTH
CULTURE: NO GROWTH
SPECIAL REQUESTS: ADEQUATE
Special Requests: ADEQUATE

## 2017-01-16 MED ORDER — RESOURCE THICKENUP CLEAR PO POWD
ORAL | Status: DC | PRN
  Filled 2017-01-16: qty 125

## 2017-01-16 MED ORDER — IOPAMIDOL (ISOVUE-300) INJECTION 61%
75.0000 mL | Freq: Once | INTRAVENOUS | Status: AC | PRN
  Administered 2017-01-16: 75 mL via INTRAVENOUS

## 2017-01-16 NOTE — Progress Notes (Addendum)
Modified Barium Swallow Progress Note  Patient Details  Name: Jay Stephens MRN: [id number] Date of Birth: 05/08/1946  Today's Date: 01/16/2017  Modified Barium Swallow completed.  Full report located under Chart Review in the Imaging Section.  Brief recommendations include the following:  Clinical Impression  Pt currently presenting with a moderate oropharyngeal dysphagia impacted by sensorimotor function post-intubation and cognitive status. Pt with premature spill to the level of the pyriform sinuses along with reduced hyolaryngeal excursion, base of tongue retraction, and epiglottic inversion resulting in reduced airway protection and silent aspiration of thin liquids during the swallow. Pt with very weak cough and unable to completely clear laryngeal residuals. Pt also with moderate-severe residuals in the vallecula and pyriform sinuses post-swallow. As the study progressed, the pt appeared to clear residuals more effectively and was cued to "swallow hard". Pt tolerated nectar-thick liquids by tsp without penetration or aspiration noted; pt unable to control sip size and straw sips resulted in significant pharyngeal residuals. Recommend initiating dysphagia 1 diet, nectar-thick liquids by teaspoon only, meds crushed in puree, full supervision to assist with feeding/ small bites and sips. Will continue to follow for diet tolerance/ consider advancement as cognitive status improves.   Swallow Evaluation Recommendations       SLP Diet Recommendations: Dysphagia 1 (Puree) solids;Nectar thick liquid   Liquid Administration via: Spoon   Medication Administration: Crushed with puree   Supervision: Staff to assist with self feeding;Full supervision/cueing for compensatory strategies   Compensations: Minimize environmental distractions;Slow rate;Small sips/bites;Effortful swallow   Postural Changes: Seated upright at 90 degrees   Oral Care Recommendations: Oral care BID   Other  Recommendations: Order thickener from pharmacy;Remove water pitcher;Clarify dietary restrictions    Metro Kungmy K Kaytlin Burklow, MA, CCC-SLP 01/16/2017,11:28 AM  [phone]

## 2017-01-16 NOTE — Evaluation (Signed)
Occupational Therapy Evaluation Patient Details Name: Jay HibbsJessie Stephens MRN: [id number] DOB: 08/20/1945 Today's Date: 01/16/2017    History of Present Illness 71 year old admitted with loculated hydropneumothorax, altered mental status,noted to have possible right facial droop and right-sided weakness; MRI revealed no acute infarct. Noted R side hydropneumothorax   Clinical Impression   Unsure of independence PTA as Pt is not reliable historian, and no family present. Pt is currently max A for ADL and mod A +2 for bed mobility. Pt requires min A to maintain sitting EOB. Pt able to follow one step cues with increased time, and multimodal cues 75% of the time this session. Pt will benefit from skilled OT in the acute setting and afterwards at SNF level to maximize safety and independence in ADL and functional transfers. As cognition improves continue to assess RUE for potential HEP.     Follow Up Recommendations  SNF;Supervision/Assistance - 24 hour    Equipment Recommendations  Other (comment) (defer to next venue)    Recommendations for Other Services       Precautions / Restrictions Precautions Precautions: Fall Precaution Comments: right chest tube Restrictions Weight Bearing Restrictions: No      Mobility Bed Mobility Overal bed mobility: Needs Assistance Bed Mobility: Supine to Sit;Sit to Supine     Supine to sit: Mod assist;+2 for physical assistance Sit to supine: Mod assist   General bed mobility comments: increased time and effort, multi modal cues to direct to task of bringing LEs to EOB. Assist to elevate trunk to upright and maintain EOB. Assist to elevate LEs back to bed  Transfers                 General transfer comment: not attempted this session as Pt is awaiting transport for MBS    Balance Overall balance assessment: Needs assistance Sitting-balance support: Feet supported;Bilateral upper extremity supported Sitting balance-Leahy Scale: Poor Sitting  balance - Comments: min guard to min assist at EOB Postural control: Posterior lean                                 ADL either performed or assessed with clinical judgement   ADL Overall ADL's : Needs assistance/impaired Eating/Feeding: NPO   Grooming: Moderate assistance;Sitting;Wash/dry face Grooming Details (indicate cue type and reason): Pt able to complete washing face sitting EOB with min A to maintain sitting and assist for thoroughness Upper Body Bathing: Maximal assistance;Bed level   Lower Body Bathing: Maximal assistance   Upper Body Dressing : Maximal assistance;Sitting Upper Body Dressing Details (indicate cue type and reason): cueing while donning new gown for sequencing Lower Body Dressing: Total assistance   Toilet Transfer:  (not attempted, Pt has catheter and flexi seal)   Toileting- Clothing Manipulation and Hygiene:  (Pt has catheter and flexi seal)   Tub/ Shower Transfer:  (not attempted this session)   Functional mobility during ADLs:  (not attemped this session as Pt is awaiting transfer for MBS) General ADL Comments: Pt with confusion, following simple commands, min A to maintain sitting EOB for ADL     Vision   Additional Comments: not assessed this session due to cognition     Perception     Praxis      Pertinent Vitals/Pain Pain Assessment: No/denies pain Faces Pain Scale: No hurt     Hand Dominance Right   Extremity/Trunk Assessment Upper Extremity Assessment Upper Extremity Assessment: RUE deficits/detail RUE Deficits / Details:  Right side overall weaker than Left side, with increased time able to respond to commands RUE Coordination: decreased fine motor;decreased gross motor   Lower Extremity Assessment RLE Deficits / Details: RLE appears grossly weaker than left, moving spontaneously and intermittently to command RLE Coordination: decreased fine motor;decreased gross motor   Cervical / Trunk Assessment Cervical /  Trunk Assessment: Kyphotic   Communication Communication Communication: HOH   Cognition Arousal/Alertness: Awake/alert Behavior During Therapy: WFL for tasks assessed/performed Overall Cognitive Status: No family/caregiver present to determine baseline cognitive functioning Area of Impairment: Orientation;Attention;Safety/judgement;Awareness;Following commands;Problem solving                 Orientation Level: Disoriented to;Place;Time;Situation Current Attention Level: Sustained   Following Commands: Follows one step commands inconsistently;Follows one step commands with increased time Safety/Judgement: Decreased awareness of safety;Decreased awareness of deficits Awareness: Intellectual Problem Solving: Slow processing;Decreased initiation;Difficulty sequencing;Requires verbal cues;Requires tactile cues     General Comments       Exercises     Shoulder Instructions      Home Living Family/patient expects to be discharged to:: Skilled nursing facility Living Arrangements: Other relatives                                      Prior Functioning/Environment          Comments: unknown        OT Problem List: Decreased strength;Decreased range of motion;Decreased activity tolerance;Impaired balance (sitting and/or standing);Decreased cognition;Decreased coordination;Decreased safety awareness;Decreased knowledge of use of DME or AE;Impaired UE functional use      OT Treatment/Interventions: Self-care/ADL training;Neuromuscular education;DME and/or AE instruction;Therapeutic activities;Cognitive remediation/compensation;Patient/family education;Balance training    OT Goals(Current goals can be found in the care plan section) Acute Rehab OT Goals Patient Stated Goal: none stated ADL Goals Pt Will Perform Grooming: with supervision;sitting Pt Will Perform Upper Body Bathing: with supervision;sitting Pt Will Perform Lower Body Bathing: with min  assist;sitting/lateral leans Pt Will Transfer to Toilet: with mod assist;stand pivot transfer;bedside commode (with RW) Pt Will Perform Toileting - Clothing Manipulation and hygiene: with min assist;sit to/from stand Additional ADL Goal #1: Pt will perform bed mobility at min A level prior to initiating ADL.  OT Frequency: Min 2X/week   Barriers to D/C:            Co-evaluation              AM-PAC PT "6 Clicks" Daily Activity     Outcome Measure Help from another person eating meals?: Total Help from another person taking care of personal grooming?: A Lot Help from another person toileting, which includes using toliet, bedpan, or urinal?: Total Help from another person bathing (including washing, rinsing, drying)?: A Lot Help from another person to put on and taking off regular upper body clothing?: A Lot Help from another person to put on and taking off regular lower body clothing?: Total 6 Click Score: 9   End of Session Nurse Communication: Mobility status;Other (comment) (in room with Pt)  Activity Tolerance: Patient tolerated treatment well Patient left: in bed;with call bell/phone within reach;with bed alarm set;with nursing/sitter in room;with SCD's reapplied;with restraints reapplied  OT Visit Diagnosis: Unsteadiness on feet (R26.81);Other abnormalities of gait and mobility (R26.89);Muscle weakness (generalized) (M62.81);Other symptoms and signs involving cognitive function;Hemiplegia and hemiparesis Hemiplegia - Right/Left: Right Hemiplegia - dominant/non-dominant: Dominant Hemiplegia - caused by: Unspecified  Time: 4098-1191 OT Time Calculation (min): 19 min Charges:  OT General Charges $OT Visit: 1 Procedure OT Evaluation $OT Eval Moderate Complexity: 1 Procedure G-Codes:     Sherryl Manges OTR/L [phone]  Evern Bio Uilani Sanville 01/16/2017, 11:19 AM

## 2017-01-16 NOTE — Progress Notes (Signed)
Nursing tried to insert an ng tube in left nare for tube feeding and were unable to place.  Resistance was met when trying to insert.  Will continue to monitor and inform day shift for possible IR placement of ng tube.  Wilson Singer, RN

## 2017-01-16 NOTE — Progress Notes (Signed)
Family Medicine Teaching Service Daily Progress Note Intern Pager: [phone]  Patient name: Jay Stephens Medical record number: [id number] Date of birth: 08/05/1945 Age: 71 y.o. Gender: male  Primary Care Provider: System, Pcp Not In Consultants: CCM, Neurology Code Status: FULL  Pt Overview and Major Events to Date:  Admitted on 01/02/2017 for AMS.  Intubated d/t respiratory distress and transferred to CCM on 7/24.  Extubated and transferred to FPTS on 01/14/17.  Assessment and Plan:  Jay Stephens is a 71 y.o. male with unknown PMH presenting with apparent altered mental status, R facial droop, and generalized weakness, found to have empyema in right side of chest  Altered Mental Status with R facial droop and weakness:  Last seen normal by his nephew in pm of 7/22. Patient apparently AAOx4 and able to do all ADLs at baseline, although physical appearance does not suggest this. Patient with no known medical history.  GCS 15 at current time. CT scan (-) for acute stroke, but is positive for partial craniotomy with gliosis in right temporal lobe. UDS and alcohol (-).  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  Patient with very large empyema of right lung. Chest tube placed 7/24 with purulent output. Grew out strep viridans. Patient on antibiotics as follows: zosyn (7/24-7/28), vanc (7/24-7/25), ancef (7/28-)  Blood cultures negative x 4 days. Patient with chest tube in place with 7/27-7/28 output of 58400mL/24hrs. Patient failed swallow study 7/29. Continue NPO. R sided facial droop is likely re-emergence of old symptoms due to stress from current illness per neurology. Patient continues to pull at lines, tubes. Pulled out NG tube overnight. Continue mittens. Patient approaching point where nutrition via ng tube is becoming a possibility. Patient has improved a lot from admission; currently AOx3, GCS 15. - Vitals signs per step-down  unit, pulse ox with vitals - daily BMP, CBC - continue give B1, folic acid - continue to monitor mental status - OT/PT/SLP following - NPO - Ativan 1-4 mg Q4H PRN for CIWA > 8 - continue ancef - continue D51/2NS with 20 mEq/L KCl @75   Right sided Empyema: CT chest confirmed large loculated hydropneumothorax on right chest as well as nonvisualization right upper lobe bronchus concerning for obstruction.  Chest tube placed on 7/24 drained 3L pus from right chest empyema.  Culture of empyema fluid positive for numerous Strep viridans bacteria, which could be related to his tooth abscess.  Extubated successfully on 01/14/17.  Repeat CXR on 01/15/17 shows improved right apical pneumothorax.  WBC is 5.4.  Patient remains afebrile. CT with 500mL out on 7/27-7/28. Output not recorded for following day, but appeared to be around 100 in atrium during exam. - continue ceftriaxone - monitor respiratory status - [place] as needed to keep sats above 92%  Electrolyte abnormalities; Hypernatremia, hyperchloremia: Likely from dehydration, although electrolytes have been stable and remain elevated even with continued hydration.  Sodium 142, Chloride 113 on 7/29. - Monitor for s/s of hypernatremia and hyperchloremia - Daily BMP - D51/2NS @ 75 mL/hr  Hypocalcemia Patient with low ionized calcium. Can replete with calcium gluconate if needed, although will opt to just watch for now. Calcium 7.1, corrected calcium 9.3. Serum calcium not low when corrected for poor albumin level. Consider ical if symptomatic. - daily bmp   Protein Calorie Malnutrition  Patient with albumin of 2.0 on admission and last albumin measured on 7/25 of 1.3, marked temporal wasting, weight of 100lbs. Patient very thin and  very frail looking on exam. Will need protein supplementation. IV fluids for now given NPO status. Patient nearing point where artificial nutrition becomes a consideration. Not at that point yet but will need to follow  over next couple of days and consider options if cannot take PO. - Nutrition consult - fluids as above  Hyperuremia Patient with bun of 38 on admission, now 14. Most likely due to muscle wasting  - will continue to watch - daily bmp - fluids as above  Elevated Transaminases Elevated AST up to 79 on admission, now 61. Patient with unknown history of alcohol use or any liver disorder.  -Will continue to trend  Tooth abscess: Poor dentition with periapical abscess around a left maxillary tooth (likely tooth number 11) seen on head CT.  Could be source of strep viridans in empyema. - Patient will likely need outpatient follow up with dentist  PMH is unknown  FEN/GI: NPO Prophylaxis: pantoprazole, SCDs   Disposition: SNF, will work with CSW on arranging this  Subjective:  Doing well this morning. No complaints, just wishes he could drink water. AOx3. Oriented to self, place, and situation. NPO. Slow to respond.  Objective: Temp:  [97.5 F (36.4 C)-98.4 F (36.9 C)] 97.9 F (36.6 C) (07/29 0430) Pulse Rate:  [85-109] 85 (07/29 0430) Resp:  [0-30] 28 (07/29 0430) BP: (102-168)/(67-97) 161/93 (07/29 0430) SpO2:  [80 %-100 %] 99 % (07/29 0430) Weight:  [136 lb 1.6 oz (61.7 kg)-137 lb 8 oz (62.4 kg)] 136 lb 1.6 oz (61.7 kg) (07/29 0500) Physical Exam: General: thin, frail, chronically-ill appearing man with temporal wasting Cardiovascular: RRR, no MRG, palpable radial pulse BUE. Respiratory: coarse rhonchi bilaterally, chest tube in place, draining purulent fluid Abdomen: +bowel sounds, nontender Extremities: thin, no lesions noted  Laboratory:  Recent Labs Lab 01/14/17 0342 01/15/17 0910 01/16/17 0719  WBC 5.4 3.9* 4.2  HGB 8.9* 12.3* 11.0*  HCT 28.3* 39.7 34.2*  PLT 73* 80* 71*    Recent Labs Lab 01/14/2017 1618  01/11/17 1002 01/12/17 0305  01/14/17 0342 01/15/17 0910 01/16/17 0552  NA 146*  < > 149* 149*  < > 149* 149* 142  K 4.3  < > 4.8 3.6  < > 3.4*  4.2 4.7  CL 113*  < > 121* 126*  < > 116* 115* 113*  CO2 19*  --  13* 15*  < > 27 24 22   BUN 39*  < > 50* 42*  < > 23* 14 14  CREATININE 1.45*  < > 1.35* 1.35*  < > 0.97 0.85 0.90  CALCIUM 8.3*  --  7.6* 7.0*  < > 7.0* 7.6* 7.1*  PROT 6.7  --  6.0* 4.7*  --   --   --   --   BILITOT 2.0*  --  1.9* 2.0*  --   --   --   --   ALKPHOS 75  --  63 51  --   --   --   --   ALT 46  --  53 39  --   --   --   --   AST 79*  --  80* 61*  --   --   --   --   GLUCOSE 213*  < > 186* 130*  < > 216* 129* 194*  < > = values in this interval not displayed.   Imaging/Diagnostic Tests: Dg Chest Port 1 View  Result Date: 01/16/2017 CLINICAL DATA:  Empyema EXAM: PORTABLE CHEST 1  VIEW COMPARISON:  01/15/2017 FINDINGS: O right basilar chest tube remains in place. Stable small right apical and medial pneumothorax. Diffuse airspace disease throughout the right lung, slightly improved. Small right effusion. Minimal left base atelectasis or infiltrate, stable. IMPRESSION: No change since prior study. Electronically Signed   By: Charlett NoseKevin  Dover M.D.   On: 01/16/2017 07:43   Dg Chest Port 1 View  Result Date: 01/15/2017 CLINICAL DATA:  Empyema, extubation EXAM: PORTABLE CHEST 1 VIEW COMPARISON:  01/14/2017 FINDINGS: Interval removal of endotracheal tube, right central line and NG tube. Diffuse airspace disease throughout the right lung is stable. Small right apical pneumothorax, 5-10% slightly decreased since prior study. Right basilar chest tube remains in place. Mild cardiomegaly. No confluent opacity on the left. IMPRESSION: Small right apical pneumothorax, slightly decreased since prior study. Continued diffuse right lung airspace disease. Electronically Signed   By: Charlett NoseKevin  Dover M.D.   On: 01/15/2017 08:00   Dg Chest Port 1 View  Result Date: 01/14/2017 CLINICAL DATA:  Empyema. EXAM: PORTABLE CHEST 1 VIEW COMPARISON:  01/13/2017. FINDINGS: Endotracheal tube, NG tube, right IJ line, right chest tube in stable position.  Stable small right pneumothorax unchanged. Stable mild right pleural thickening again noted. Diffuse right lung infiltrate unchanged. Heart size normal. IMPRESSION: 1. Lines and tubes including right chest tube in stable position. Stable small right pneumothorax is unchanged. Stable mild right pleural thickening again noted . 2. Diffuse right lung infiltrate unchanged. Electronically Signed   By: Maisie Fushomas  Register   On: 01/14/2017 06:56   Dg Chest Port 1 View  Result Date: 01/13/2017 CLINICAL DATA:  Intubation. EXAM: PORTABLE CHEST 1 VIEW COMPARISON:  01/12/2017. FINDINGS: Endotracheal tube has been repositioned and its tip is 4 cm above the carina. NG tube, right IJ line stable position. Right chest tube in stable position. Right pneumothorax stable. Diffuse right lung infiltrate, slightly improved from prior exam. Low lung volumes with basilar atelectasis. Stable right pleural thickening. Heart size stable. IMPRESSION: 1. Endotracheal tube is been repositioned, its tip is in good anatomic position 4 cm above the carina . Right IJ line and NG tube in stable position . Right chest tube in stable position. Stable right pneumothorax. Stable right pleural thickening. 2. Diffuse slight lung infiltrate again noted. Slight improvement from prior exam. Electronically Signed   By: Maisie Fushomas  Register   On: 01/13/2017 06:25   Dg Abd Portable 1v  Result Date: 01/15/2017 CLINICAL DATA:  Feeding tube placement EXAM: PORTABLE ABDOMEN - 1 VIEW COMPARISON:  None. FINDINGS: Feeding tube tip is in the proximal stomach. Right basilar chest tube noted with right pneumothorax noted medially. IMPRESSION: Feeding tube tip in the proximal stomach. Electronically Signed   By: Charlett NoseKevin  Dover M.D.   On: 01/15/2017 12:16    Myrene BuddyFletcher, Shajuan Musso, MD 01/16/2017, 9:34 AM PGY-1, [facility] FPTS Intern pager: [phone], text pages welcome

## 2017-01-16 NOTE — Progress Notes (Signed)
PULMONARY / CRITICAL CARE MEDICINE   Name: Jay Stephens MRN: [id number] DOB: 1945/07/25    ADMISSION DATE:01/13/2017 CONSULTATION DATE:01/11/2017  REFERRING IH:[id number]  CHIEF COMPLAINT:AMS  HISTORY OF PRESENT ILLNESS: 71 yo male with aspiration PNA, Rt empyema, VDRF, septic shock.  SUBJECTIVE: Breathing better.  VITAL SIGNS: BP 133/89   Pulse 93   Temp 97.7 F (36.5 C) (Oral)   Resp (!) 28   Ht _0  (1.753 m)   Wt 136 lb 1.6 oz (61.7 kg)   SpO2 97%   BMI 20.10 kg/m   INTAKE / OUTPUT: I/O last 3 completed shifts: In: 2757.5 [P.O.:360; I.V.:2147.5; IV Piggyback:250] Out: 1370 [Urine:975; Stool:95; Chest Tube:300]  PHYSICAL EXAMINATION:  General - pleasant Eyes - pupils reactive ENT - no sinus tenderness, no oral exudate, no LAN Cardiac - regular, no murmur Chest - no wheeze, rales, RT chest tube no air leak Abd - soft, non tender Ext - no edema Skin - no rashes Neuro - normal strength Psych - normal mood   LABS:  BMET  Recent Labs Lab 01/14/17 0342 01/15/17 0910 01/16/17 0552  NA 149* 149* 142  K 3.4* 4.2 4.7  CL 116* 115* 113*  CO2 _1 BUN 23* 14 14  CREATININE 0.97 0.85 0.90  GLUCOSE 216* 129* 194*    Electrolytes  Recent Labs Lab 01/11/17 1648 01/12/17 0305 01/12/17 1626  01/14/17 0342 01/15/17 0910 01/16/17 0552  CALCIUM  --  7.0* 6.7*  < > 7.0* 7.6* 7.1*  MG 1.9 1.6* 2.5*  --   --   --   --   PHOS 3.7 3.8 2.3*  --   --   --   --   < > = values in this interval not displayed.  CBC  Recent Labs Lab 01/14/17 0342 01/15/17 0910 01/16/17 0719  WBC 5.4 3.9* 4.2  HGB 8.9* 12.3* 11.0*  HCT 28.3* 39.7 34.2*  PLT 73* 80* 71*    Coag's  Recent Labs Lab 01/12/2017 1618  APTT 26  INR 1.15    Sepsis Markers  Recent Labs Lab 01/11/17 1220 01/11/17 1357  LATICACIDVEN 4.5* 2.6*    ABG  Recent Labs Lab 01/11/17 1124 01/12/17 1647  PHART 7.479* 7.429  PCO2ART 24.7* 28.1*  PO2ART 227.0* 111*     Liver Enzymes  Recent Labs Lab 12/25/2016 1618 01/11/17 1002 01/12/17 0305  AST 79* 80* 61*  ALT 46 53 39  ALKPHOS 75 63 51  BILITOT 2.0* 1.9* 2.0*  ALBUMIN 2.0* 1.7* 1.3*    Cardiac Enzymes  Recent Labs Lab 01/02/2017 2209 01/11/17 0316  TROPONINI 0.03* 0.03*    Glucose  Recent Labs Lab 01/15/17 1114 01/15/17 1617 01/15/17 2022 01/16/17 0006 01/16/17 0423 01/16/17 0750  GLUCAP 138* 155* 155* 178* 183* 203*    Imaging Dg Chest Port 1 View  Result Date: 01/16/2017 CLINICAL DATA:  Empyema EXAM: PORTABLE CHEST 1 VIEW COMPARISON:  01/15/2017 FINDINGS: O right basilar chest tube remains in place. Stable small right apical and medial pneumothorax. Diffuse airspace disease throughout the right lung, slightly improved. Small right effusion. Minimal left base atelectasis or infiltrate, stable. IMPRESSION: No change since prior study. Electronically Signed   By: Rolm Baptise M.D.   On: 01/16/2017 07:43   Dg Abd Portable 1v  Result Date: 01/15/2017 CLINICAL DATA:  Feeding tube placement EXAM: PORTABLE ABDOMEN - 1 VIEW COMPARISON:  None. FINDINGS: Feeding tube tip is in the proximal stomach. Right basilar chest tube noted with right pneumothorax noted  medially. IMPRESSION: Feeding tube tip in the proximal stomach. Electronically Signed   By: Rolm Baptise M.D.   On: 01/15/2017 12:16   Dg Swallowing Func-speech Pathology  Result Date: 01/16/2017 Objective Swallowing Evaluation: Type of Study: MBS-Modified Barium Swallow Study Patient Details Name: Jay Stephens MRN: [id number] Date of Birth: 26-Jan-1946 Today's Date: 01/16/2017 Time: SLP Start Time (ACUTE ONLY): 1040-SLP Stop Time (ACUTE ONLY): 1100 SLP Time Calculation (min) (ACUTE ONLY): 20 min Past Medical History: No past medical history on file. Past Surgical History: No past surgical history on file. HPI: Mr. Sine is a 71yo male with unknown PMH per EMR who was admitted to [facility] 7/23 for AMS, R facial droop, and generalized  weakness. Last seen normal by nephew 7/22 PM. Per nephew, apparently A&Ox4 at baselineand able to someADLs but requires some assistance. CT head negative for stroke but with area of encephalomalacia/gliosis in R temporal lobe. CT chest with large loculated hydropneumothorax on R chest and nonvisualization of RUL bronchus. Intubated 7/24-7/27. CXR 7/28 showed diffuse right lung infiltrate. No Data Recorded Assessment / Plan / Recommendation CHL IP CLINICAL IMPRESSIONS 01/16/2017 Clinical Impression Pt currently presenting with a moderate oropharyngeal dysphagia impacted by sensorimotor function post-intubation and cognitive status. Pt with premature spill to the level of the pyriform sinuses along with reduced hyolaryngeal excursion resulting in reduced airway protection and silent aspiration of thin liquids during the swallow. Pt with very weak cough and unable to completely clear laryngeal residuals. Pt also with mild-severe residuals in the vallecula and pyriform sinuses post-swallow across consistencies which were inconsistent. As the study progressed, the pt appeared to clear residuals more effectively and was cued to "swallow hard". Cognitively unable to complete any other maneuvers or strategies. Pt tolerated nectar-thick liquids by tsp without penetration or aspiration noted; pt unable to control sip size and straw sips resulted in more significant pharyngeal residuals. Recommend initiating dysphagia 1 diet, nectar-thick liquids by teaspoon only, meds crushed in puree, full supervision to assist with feeding/ small bites and sips/ cue pt to swallow hard with each bolus. Will continue to follow for diet tolerance/ consider advancement as cognitive status improves. SLP Visit Diagnosis Dysphagia, unspecified (R13.10) Attention and concentration deficit following -- Frontal lobe and executive function deficit following -- Impact on safety and function Moderate aspiration risk   CHL IP TREATMENT RECOMMENDATION  01/16/2017 Treatment Recommendations Therapy as outlined in treatment plan below   Prognosis 01/16/2017 Prognosis for Safe Diet Advancement Good Barriers to Reach Goals Cognitive deficits Barriers/Prognosis Comment -- CHL IP DIET RECOMMENDATION 01/16/2017 SLP Diet Recommendations Dysphagia 1 (Puree) solids;Nectar thick liquid Liquid Administration via Spoon Medication Administration Crushed with puree Compensations Minimize environmental distractions;Slow rate;Small sips/bites;Effortful swallow Postural Changes Seated upright at 90 degrees   CHL IP OTHER RECOMMENDATIONS 01/16/2017 Recommended Consults -- Oral Care Recommendations Oral care BID Other Recommendations Order thickener from pharmacy;Remove water pitcher;Clarify dietary restrictions   CHL IP FOLLOW UP RECOMMENDATIONS 01/16/2017 Follow up Recommendations 24 hour supervision/assistance   CHL IP FREQUENCY AND DURATION 01/16/2017 Speech Therapy Frequency (ACUTE ONLY) min 3x week Treatment Duration 2 weeks      CHL IP ORAL PHASE 01/16/2017 Oral Phase Impaired Oral - Pudding Teaspoon -- Oral - Pudding Cup -- Oral - Honey Teaspoon -- Oral - Honey Cup -- Oral - Nectar Teaspoon Delayed oral transit;Premature spillage Oral - Nectar Cup -- Oral - Nectar Straw Lingual/palatal residue;Piecemeal swallowing;Delayed oral transit;Premature spillage Oral - Thin Teaspoon Delayed oral transit;Premature spillage Oral - Thin Cup -- Oral - Thin  Straw Delayed oral transit;Premature spillage Oral - Puree Delayed oral transit;Premature spillage Oral - Mech Soft -- Oral - Regular -- Oral - Multi-Consistency -- Oral - Pill -- Oral Phase - Comment --  CHL IP PHARYNGEAL PHASE 01/16/2017 Pharyngeal Phase Impaired Pharyngeal- Pudding Teaspoon -- Pharyngeal -- Pharyngeal- Pudding Cup -- Pharyngeal -- Pharyngeal- Honey Teaspoon -- Pharyngeal -- Pharyngeal- Honey Cup -- Pharyngeal -- Pharyngeal- Nectar Teaspoon Delayed swallow initiation-vallecula;Delayed swallow initiation-pyriform  sinuses;Reduced epiglottic inversion;Reduced anterior laryngeal mobility;Reduced laryngeal elevation;Reduced tongue base retraction;Pharyngeal residue - valleculae Pharyngeal -- Pharyngeal- Nectar Cup -- Pharyngeal -- Pharyngeal- Nectar Straw Delayed swallow initiation-pyriform sinuses;Reduced epiglottic inversion;Reduced anterior laryngeal mobility;Reduced laryngeal elevation;Reduced tongue base retraction;Pharyngeal residue - valleculae;Pharyngeal residue - pyriform Pharyngeal -- Pharyngeal- Thin Teaspoon Delayed swallow initiation-pyriform sinuses;Reduced epiglottic inversion;Reduced anterior laryngeal mobility;Reduced laryngeal elevation;Reduced tongue base retraction;Pharyngeal residue - valleculae Pharyngeal -- Pharyngeal- Thin Cup -- Pharyngeal -- Pharyngeal- Thin Straw Delayed swallow initiation-pyriform sinuses;Reduced epiglottic inversion;Reduced anterior laryngeal mobility;Reduced laryngeal elevation;Reduced airway/laryngeal closure;Reduced tongue base retraction;Penetration/Aspiration during swallow;Pharyngeal residue - valleculae;Pharyngeal residue - pyriform Pharyngeal Material enters airway, passes BELOW cords without attempt by patient to eject out (silent aspiration) Pharyngeal- Puree Delayed swallow initiation-vallecula;Delayed swallow initiation-pyriform sinuses;Reduced epiglottic inversion;Reduced anterior laryngeal mobility;Reduced laryngeal elevation;Reduced tongue base retraction;Pharyngeal residue - valleculae;Pharyngeal residue - pyriform Pharyngeal -- Pharyngeal- Mechanical Soft -- Pharyngeal -- Pharyngeal- Regular -- Pharyngeal -- Pharyngeal- Multi-consistency -- Pharyngeal -- Pharyngeal- Pill -- Pharyngeal -- Pharyngeal Comment --  CHL IP CERVICAL ESOPHAGEAL PHASE 01/16/2017 Cervical Esophageal Phase Impaired Pudding Teaspoon -- Pudding Cup -- Honey Teaspoon -- Honey Cup -- Nectar Teaspoon WFL Nectar Cup -- Nectar Straw Reduced cricopharyngeal relaxation Thin Teaspoon WFL Thin Cup -- Thin  Straw Reduced cricopharyngeal relaxation Puree Reduced cricopharyngeal relaxation Mechanical Soft -- Regular -- Multi-consistency -- Pill -- Cervical Esophageal Comment -- No flowsheet data found. Kern Reap, MA, CCC-SLP 01/16/2017, 11:26 AM [phone]               STUDIES: 7/23 CT head>>no acute intracranial abnormalities. chronic area of encephalomalacia/gliosis in R temporal lobe, likely postoperative. Periapical abscess around L maxillary tooth. 7/23 CT chest>>large loculated R hydropneumothorax with atelectatic R lung and leftward mediastinal shift. Nonvisualization of RUL bronchus, suggesting central occluding lesion 7/25 MRI brain>1. No acute intracranial abnormality identified. 2. Right temporal lobe hemosiderin stained chronic encephalomalacia. 3. Moderate brain parenchymal volume loss. 4. Moderate diffuse paranasal sinus disease. 5. Prominent extra-axial CSF spaces, probably chronic hygroma, no significant mass effect on the brain. 7/25> ECHO: 55-60%, Trileaflet; moderately thickened, moderately calcified leaflets, G2DD and akinesis of the inferolateral myocardium  CULTURES: 7/24 BAL rightCx>>>multiple organisms 7/24 R pleural fluid Cx>>> Viridans Streptococcus 7/24 Blood cultures x2>>>  ANTIBIOTICS: Vanc 7/24>>>7/25 Zosyn 7/24>>>  SIGNIFICANT EVENTS: 7/23 admit to FMTS 7/24 rapid response, intubated, bronchoscopy, R chest tube placed with ~3L milky yellow pus drained  LINES/TUBES: 7/24 ETT >> 7/27 7/24 chest tube rt >>  ASSESSMENT / PLAN:  Acute hypoxic respiratory failure. Aspiration pneumonia. Viridans Streptococcus Rt empyema. - day 6 of abx - f/u CXR - change chest tube to water seal >> if CXR 7/30 stable, then likely can d/c chest tube   Chesley Mires, MD [place] 01/16/2017, 12:02 PM Pager:  [phone] After 3pm call: [phone]

## 2017-01-16 NOTE — Progress Notes (Signed)
  Speech Language Pathology Treatment: Dysphagia  Patient Details Name: Jay Stephens MRN: [id number] DOB: 04/28/1946 Today's Date: 01/16/2017 Time: [id number] SLP Time Calculation (min) (ACUTE ONLY): 15 min  Assessment / Plan / Recommendation Clinical Impression  Dysphagia treatment provided for PO trials. Pt tolerated ice chips and puree consistency without overt s/s of aspiration. Following trial of thin liquid, pt had a wet vocal quality. Pt with weak cough when cued. Pt responding to questions this morning, speech intelligibility improved, alert and following 1-step commands. Recommend proceeding with a MBS to objectively evaluate swallow function. Plan for MBS later this morning. Until then, recommend that pt remain NPO.   HPI HPI: [name] is a 71yo male with unknown PMH per EMR [facility], R facial droop, and generalized weakness. Last seen normal by nephew 7/22 PM. Per nephew, apparently A&Ox4 at baselineand able to someADLs but requires some assistance. CT head negative for stroke but with area of encephalomalacia/gliosis in R temporal lobe. CT chest with large loculated hydropneumothorax on R chest and nonvisualization of RUL bronchus. Intubated 7/24-7/27. CXR 7/28 showed diffuse right lung infiltrate.      SLP Plan  MBS       Recommendations  Diet recommendations: NPO Medication Administration: Via alternative means                Oral Care Recommendations: Oral care QID Follow up Recommendations: 24 hour supervision/assistance SLP Visit Diagnosis: Dysphagia, unspecified (R13.10) Plan: MBS       GO                Amy Cecille AverK Oleksiak, MA, CCC-SLP 01/16/2017, 8:47 AM [phone]

## 2017-01-17 ENCOUNTER — Inpatient Hospital Stay (HOSPITAL_COMMUNITY): Payer: Medicare Other

## 2017-01-17 ENCOUNTER — Other Ambulatory Visit (HOSPITAL_COMMUNITY): Payer: Medicare Other

## 2017-01-17 DIAGNOSIS — K047 Periapical abscess without sinus: Secondary | ICD-10-CM | POA: Diagnosis present

## 2017-01-17 DIAGNOSIS — R7881 Bacteremia: Secondary | ICD-10-CM | POA: Diagnosis present

## 2017-01-17 LAB — BASIC METABOLIC PANEL
ANION GAP: 6 (ref 5–15)
BUN: 12 mg/dL (ref 6–20)
CALCIUM: 7.2 mg/dL — AB (ref 8.9–10.3)
CO2: 23 mmol/L (ref 22–32)
CREATININE: 0.89 mg/dL (ref 0.61–1.24)
Chloride: 112 mmol/L — ABNORMAL HIGH (ref 101–111)
GFR calc non Af Amer: >60 mL/min (ref >60)
Glucose, Bld: 231 mg/dL — ABNORMAL HIGH (ref 65–99)
Potassium: 4.2 mmol/L (ref 3.5–5.1)
SODIUM: 141 mmol/L (ref 135–145)

## 2017-01-17 LAB — CBC
HCT: 33.3 % — ABNORMAL LOW (ref 39.0–52.0)
HEMOGLOBIN: 10.2 g/dL — AB (ref 13.0–17.0)
MCH: 25.4 pg — ABNORMAL LOW (ref 26.0–34.0)
MCHC: 30.6 g/dL (ref 30.0–36.0)
MCV: 83 fL (ref 78.0–100.0)
Platelets: 86 10*3/uL — ABNORMAL LOW (ref 150–400)
RBC: 4.01 MIL/uL — ABNORMAL LOW (ref 4.22–5.81)
RDW: 14.5 % (ref 11.5–15.5)
WBC: 4.6 10*3/uL (ref 4.0–10.5)

## 2017-01-17 LAB — BLOOD GAS, ARTERIAL
ACID-BASE DEFICIT: 1.2 mmol/L (ref 0.0–2.0)
BICARBONATE: 22.3 mmol/L (ref 20.0–28.0)
Drawn by: 41977
O2 SAT: 98.6 %
PATIENT TEMPERATURE: 98.6
PO2 ART: 403 mmHg — AB (ref 83.0–108.0)
pCO2 arterial: 32.9 mmHg (ref 32.0–48.0)
pH, Arterial: 7.446 (ref 7.350–7.450)

## 2017-01-17 LAB — GLUCOSE, CAPILLARY
GLUCOSE-CAPILLARY: 260 mg/dL — AB (ref 65–99)
Glucose-Capillary: 258 mg/dL — ABNORMAL HIGH (ref 65–99)
Glucose-Capillary: 185 mg/dL — ABNORMAL HIGH (ref 65–99)
Glucose-Capillary: 232 mg/dL — ABNORMAL HIGH (ref 65–99)
Glucose-Capillary: 248 mg/dL — ABNORMAL HIGH (ref 65–99)
Glucose-Capillary: 230 mg/dL — ABNORMAL HIGH (ref 65–99)

## 2017-01-17 LAB — HIV ANTIBODY (ROUTINE TESTING W REFLEX): HIV SCREEN 4TH GENERATION: NONREACTIVE

## 2017-01-17 MED ORDER — ADULT MULTIVITAMIN W/MINERALS CH
1.0000 | ORAL_TABLET | Freq: Every day | ORAL | Status: DC
  Administered 2017-01-17 – 2017-01-21 (×4): 1 via ORAL
  Filled 2017-01-17 (×3): qty 1

## 2017-01-17 MED FILL — Medication: Qty: 1 | Status: AC

## 2017-01-17 NOTE — Progress Notes (Signed)
Pt desat on oxygen level 67, pulse 37 put on non breathable mask, paged rapid response, respiratory and on call doctor, rapid response was busy with pt on other floor couldn't  come to the floor to review pt,  on call physician DR Primitivo GauzeFletcher, came to floor to review pt ordered for chest X-RAY and ABG result for ordered test was negative, after a while pt HR and O2 level came to normal range took pt off from non breathable mask, will continue to monitor pt,

## 2017-01-17 NOTE — Progress Notes (Signed)
PULMONARY / CRITICAL CARE MEDICINE   Name: Jay Stephens MRN: [id number] DOB: 02/27/1946    ADMISSION DATE:01/08/2017 CONSULTATION DATE:01/11/2017  REFERRING TG:GYIRSWNI  CHIEF COMPLAINT:AMS  HISTORY OF PRESENT ILLNESS: 71 year old male with aspiration PNA, Rt empyema, VDRF, septic shock.  SUBJECTIVE: No complaints or acute events overnight.   VITAL SIGNS: BP (!) 165/97 (BP Location: Right Arm)   Pulse (!) 115   Temp 98.3 F (36.8 C) (Oral)   Resp (!) 22   Ht _0  (1.753 m)   Wt 62.6 kg (138 lb)   SpO2 93%   BMI 20.38 kg/m   INTAKE / OUTPUT: I/O last 3 completed shifts: In: 2800 [P.O.:540; I.V.:2110; IV Piggyback:150] Out: 1205 [Urine:250; Stool:755; Chest Tube:200]  PHYSICAL EXAMINATION: General:  Frail elderly appearing male in NAD resting in bed Neuro: Alert, confused. Non-focal HEENT:  [place]/AT, No JVD noted, PERRL Cardiovascular:  RRR, no MRG Lungs:  Clear Abdomen:  Soft, non-distended Musculoskeletal:  Cachectic Skin:  Intact, MMM  LABS:  BMET  Recent Labs Lab 01/15/17 0910 01/16/17 0552 01/17/17 0443  NA 149* 142 141  K 4.2 4.7 4.2  CL 115* 113* 112*  CO2 _1 BUN _2 CREATININE 0.85 0.90 0.89  GLUCOSE 129* 194* 231*    Electrolytes  Recent Labs Lab 01/11/17 1648 01/12/17 0305 01/12/17 1626  01/15/17 0910 01/16/17 0552 01/17/17 0443  CALCIUM  --  7.0* 6.7*  < > 7.6* 7.1* 7.2*  MG 1.9 1.6* 2.5*  --   --   --   --   PHOS 3.7 3.8 2.3*  --   --   --   --   < > = values in this interval not displayed.  CBC  Recent Labs Lab 01/15/17 0910 01/16/17 0719 01/17/17 0443  WBC 3.9* 4.2 4.6  HGB 12.3* 11.0* 10.2*  HCT 39.7 34.2* 33.3*  PLT 80* 71* 86*    Coag's  Recent Labs Lab 01/07/2017 1618  APTT 26  INR 1.15    Sepsis Markers  Recent Labs Lab 01/11/17 1220 01/11/17 1357  LATICACIDVEN 4.5* 2.6*    ABG  Recent Labs Lab 01/11/17 1124 01/12/17 1647 01/17/17 0200  PHART 7.479* 7.429 7.446   PCO2ART 24.7* 28.1* 32.9  PO2ART 227.0* 111* 403*    Liver Enzymes  Recent Labs Lab 12/21/2016 1618 01/11/17 1002 01/12/17 0305  AST 79* 80* 61*  ALT 46 53 39  ALKPHOS 75 63 51  BILITOT 2.0* 1.9* 2.0*  ALBUMIN 2.0* 1.7* 1.3*    Cardiac Enzymes  Recent Labs Lab 01/18/2017 2209 01/11/17 0316  TROPONINI 0.03* 0.03*    Glucose  Recent Labs Lab 01/16/17 1214 01/16/17 1609 01/16/17 1958 01/17/17 0122 01/17/17 0426 01/17/17 0800  GLUCAP 190* 179* 221* 260* 258* 185*    Imaging Ct Chest W Contrast  Result Date: 01/17/2017 CLINICAL DATA:  Admitted with aspiration pneumonia and septic shock. Empyema. Concern for right upper lobe mass on admission chest CT. EXAM: CT CHEST WITH CONTRAST TECHNIQUE: Multidetector CT imaging of the chest was performed during intravenous contrast administration. CONTRAST:  17m ISOVUE-300 IOPAMIDOL (ISOVUE-300) INJECTION 61% COMPARISON:  12/28/2016 FINDINGS: Cardiovascular: No cardiomegaly. No pericardial effusion. There is thinning of the lateral and inferior wall left ventricle consistent with remote infarct. Atherosclerotic calcification of the coronaries is extensive. No acute vascular finding. Mediastinum/Nodes: Asymmetric enlargement of right hilar lymph nodes no measuring up to 14 mm. Sub- carinaladenopathy is also present. These are nonspecific in this setting. Lungs/Pleura: Significantly decreased  empyemawith resolved mediastinal shift. Small pockets of fluid are seen along the lateral mid chest and in the mid fissures. Residual fissural fluid measures up to 3 cm in length. Lateral chest wall fluid measures up to 14 mm in thickness. Patchy ground-glass and consolidative opacities in the right lung, greatest in the lower lobe. No visible cavitary focus. Small pneumothorax, estimated at 10%. Right basilar chest tube is partly covered, unremarkable where seen. There is a small to moderate layering left pleural effusion without definitive loculation. No  pneumonia seen on the left. Centrilobular emphysema. Initially there is concern for central airway lesion; obstructing lesion is not seen today. Upper Abdomen: Partly seen ascites. There is also retroperitoneal edema which creates an indistinct appearance of the gastric wall. Musculoskeletal: No acute or aggressive finding IMPRESSION: 1. Significantly improved right-sided empyema. Small pockets of residual fluid are seen along the lateral chest wall and fissures. Pneumothorax is small and stable from chest x-ray earlier today. 2. Pneumonia and atelectasis on the right, greatest in the lower lobe. 3. Diminished concern for obstructing airway lesion when compared to admission CT, when there was bronchial distortion due to the pleural effusion. Recommend three-month follow-up CT in this smoker with nonspecific adenopathy. 4. Small upper abdominal ascites that is newly seen. 5. Aortic Atherosclerosis (ICD10-I70.0) and Emphysema (ICD10-J43.9). 6. 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 Electronically Signed   By: Monte Fantasia M.D.   On: 01/17/2017 07:22   Dg Chest Port 1 View  Result Date: 01/17/2017 CLINICAL DATA:  Oxygen desaturation EXAM: PORTABLE CHEST 1 VIEW COMPARISON:  CT chest 01/16/2017.  Chest 01/16/2017. FINDINGS: Shallow inspiration. Atelectasis or infiltration in the right lung base and in the left lung base. Small residual right pneumothorax with right chest tube in place. Cardiac enlargement without vascular congestion. Tortuous aorta. No significant changes since previous study, lying for differences in technique. IMPRESSION: Shallow inspiration with atelectasis or infiltration in the lung bases. Small residual right pneumothorax with right chest tube in place. Cardiac enlargement. Electronically Signed   By: Lucienne Capers M.D.   On: 01/17/2017 02:23     STUDIES: 7/23 CT head>>no acute intracranial abnormalities. chronic area of encephalomalacia/gliosis in  R temporal lobe, likely postoperative. Periapical abscess around L maxillary tooth. 7/23 CT chest>>large loculated R hydropneumothorax with atelectatic R lung and leftward mediastinal shift. Nonvisualization of RUL bronchus, suggesting central occluding lesion 7/25 MRI brain>1. No acute intracranial abnormality identified. Right temporal lobe hemosiderin stained chronic encephalomalacia. Moderate brain parenchymal volume loss. Moderate diffuse paranasal sinus disease. Prominent extra-axial CSF spaces, probably chronic hygroma, no significant mass effect on the brain. 7/25> ECHO: 55-60%, Trileaflet; moderately thickened, moderately calcified leaflets, G2DD and akinesis of the inferolateral myocardium  CULTURES: 7/24 BAL rightCx>>>multiple organisms 7/24 R pleural fluid Cx>>> Viridans Streptococcus 7/24 Blood cultures x2>>>  ANTIBIOTICS: Vanc 7/24>>>7/25 Zosyn 7/24>>>  SIGNIFICANT EVENTS: 7/23 admit to [facility] 7/24 rapid response, intubated, bronchoscopy, R chest tube placed with ~3L milky yellow pus drained  LINES/TUBES: 7/24 ETT >> 7/27 7/24 chest tube rt >>  ASSESSMENT / PLAN:  Acute hypoxic respiratory failure. Aspiration pneumonia. Viridans Streptococcus Rt empyema. - Antibiotics per primary team  - CXR/CT improved today with waterseal x 24 hours, however, some residual PTX. Should be ok to remove chest tube as PTX is not due to parenchymal insult. Will discuss with attending.   Georgann Housekeeper, AGACNP-BC [facility] Pulmonology/Critical Care Pager [phone] or [phone]  01/17/2017 11:27 AM

## 2017-01-17 NOTE — NC FL2 (Signed)
[place] MEDICAID FL2 LEVEL OF CARE SCREENING TOOL     IDENTIFICATION  Patient Name: Jay Stephens Birthdate: 12/24/1945 Sex: male Admission Date (Current Location): 12/24/2016  [facility] [place]   Facility and Address:  [facility], [address], [place], [place] [address]      Provider Number: [id number]  Attending Physician Name and Address:  Jay MannersHensel, William Stephens, Stephens  Relative Name and Phone Number:  Jay Stephens, nephew, [phone]    Current Level of Care: Hospital Recommended Level of Care: Skilled Nursing Facility Prior Approval Number:    Date Approved/Denied:   PASRR Number: [id number] Stephens  (In [place] as Jay Stephens, [birth year])  Discharge Plan: SNF    Current Diagnoses: Patient Active Problem List   Diagnosis Date Noted  . Chest tube in place   . Protein-calorie malnutrition (HCC) 01/11/2017  . Acute respiratory failure (HCC)   . Empyema (HCC)   . Cerebrovascular accident (CVA) due to thrombosis of precerebral artery (HCC)   . Weakness   . Altered mental status 01/02/2017    Orientation RESPIRATION BLADDER Height & Weight     Self  Normal Incontinent, External catheter Weight: 62.6 kg (138 lb) Height:  5\' 9"  (175.3 cm)  BEHAVIORAL SYMPTOMS/MOOD NEUROLOGICAL BOWEL NUTRITION STATUS      Incontinent (Rectal tube) Diet (Please see DC Summary)  AMBULATORY STATUS COMMUNICATION OF NEEDS Skin   Extensive Assist Verbally Normal                       Personal Care Assistance Level of Assistance  Bathing, Feeding, Dressing Bathing Assistance: Maximum assistance Feeding assistance: Limited assistance Dressing Assistance: Maximum assistance     Functional Limitations Info             SPECIAL CARE FACTORS FREQUENCY  PT (By licensed PT), OT (By licensed OT)     PT Frequency: 5x/week OT Frequency: 3x/week            Contractures      Additional Factors Info  Code Status, Allergies Code Status Info:  Full Allergies Info: NKA           Current Medications (01/17/2017):  This is the current hospital active medication list Current Facility-Administered Medications  Medication Dose Route Frequency Provider Last Rate Last Dose  . ceFAZolin (ANCEF) IVPB 1 Stephens/50 mL premix  1 Stephens Intravenous Q8H Jay Stephens, RPH   Stopped at 01/17/17 0136  . dextrose 5 % and 0.45 % NaCl with KCl 20 mEq/L infusion   Intravenous Continuous Jay Stephens 75 mL/hr at 01/17/17 0244    . feeding supplement (JEVITY 1.2 CAL) liquid 1,000 mL  1,000 mL Per Tube Continuous Sood, Vineet, Stephens      . feeding supplement (PRO-STAT SUGAR FREE 64) liquid 30 mL  30 mL Per Tube Daily Jay Stephens   30 mL at 01/16/17 0949  . folic acid injection 1 mg  1 mg Intravenous Daily Jay Stephens   1 mg at 01/16/17 1356  . heparin injection 5,000 Units  5,000 Units Subcutaneous Q8H Jay Stephens   5,000 Units at 01/17/17 0525  . labetalol (NORMODYNE,TRANDATE) injection 10 mg  10 mg Intravenous Q2H PRN Jay Stephens      . LORazepam (ATIVAN) injection 1-4 mg  1-4 mg Intravenous Q4H PRN Jay Stephens      . MEDLINE mouth rinse  15 mL Mouth Rinse BID  Jay Stephens   15 mL at 01/16/17 2154  . RESOURCE THICKENUP CLEAR   Oral PRN Jay Stephens      . sodium chloride flush (NS) 0.9 % injection 3 mL  3 mL Intravenous Q12H Jay Stephens   3 mL at 01/16/17 1000  . thiamine (B-1) injection 100 mg  100 mg Intravenous Daily Jay Stephens   100 mg at 01/16/17 [id number]     Discharge Medications: Please see discharge summary for Stephens list of discharge medications.  Relevant Imaging Results:  Relevant Lab Results:   Additional Information SSN: [id number]     Pt has Stephens right basilar chest tube   Jay Stephens, LCSWA

## 2017-01-17 NOTE — Progress Notes (Signed)
DR Primitivo GauzeFletcher call me on phone to cancel pt routine chest x-ray since he had stat chest x-ray not long ago, radiology unit has been informed

## 2017-01-17 NOTE — Progress Notes (Signed)
  Speech Language Pathology Treatment: Dysphagia  Patient Details Name: Jay Stephens MRN: [id number] DOB: 12/14/1945 Today's Date: 01/17/2017 Time: [id number] SLP Time Calculation (min) (ACUTE ONLY): 20 min  Assessment / Plan / Recommendation Clinical Impression  Dysphagia treatment provided for diet tolerance check. Pt observed with meal tray and NT assisting with feeding. Pt tolerating small bites/ sips with dysphagia 1/ nectar-thick liquids by teaspoon. No overt s/s of aspiration were noted. Pt was cued to use an effortful swallow throughout session. Pt following all directions and cues today. Recommend continuing current diet with supervision to cue pt to swallow hard/ swallow 2 times as able. Reviewed recommendations with NT and RN who were in room with pt. Will continue to follow for diet tolerance/ consider advancement vs repeat MBS as pt's cognitive status continues to improve.   HPI HPI: [name] is a 71yo male with unknown PMH per EMR [facility], R facial droop, and generalized weakness. Last seen normal by nephew 7/22 PM. Per nephew, apparently A&Ox4 at baselineand able to someADLs but requires some assistance. CT head negative for stroke but with area of encephalomalacia/gliosis in R temporal lobe. CT chest with large loculated hydropneumothorax on R chest and nonvisualization of RUL bronchus. Intubated 7/24-7/27. CXR 7/28 showed diffuse right lung infiltrate.      SLP Plan  Continue with current plan of care       Recommendations  Diet recommendations: Dysphagia 1 (puree);Nectar-thick liquid Liquids provided via: Teaspoon Medication Administration: Crushed with puree Supervision: Staff to assist with self feeding;Full supervision/cueing for compensatory strategies Compensations: Minimize environmental distractions;Slow rate;Small sips/bites;Multiple dry swallows after each bite/sip;Effortful swallow                Oral Care Recommendations: Oral  care BID Follow up Recommendations: 24 hour supervision/assistance SLP Visit Diagnosis: Dysphagia, unspecified (R13.10) Plan: Continue with current plan of care       GO                Metro KungAmy K Ota Ebersole, MA, CCC-SLP 01/17/2017, 9:40 AM

## 2017-01-17 NOTE — Significant Event (Addendum)
Rapid Response Event Note  Overview:Called d/t pt SpO2-60s.  Unable to come see patient right away d/t another call but RT sent to bedside to evaluate pt. Time Called: 0120 Arrival Time: 0210 Event Type: Respiratory  Initial Focused Assessment: Per RT, pulse ox waveform not good.    Pt not in distress. However, pt placed on NRB until able to get accurate pulse ox.  Dr. Primitivo GauzeFletcher to bedside and ordered ABG, PCXR.   Upon my assessment at 0210, pt SpO2-100% on 100% NRB, pt in no distress, alert and oriented. Lungs with rhonchi t/o R>L. Per Dr. Primitivo GauzeFletcher, PCXR unchanged. ABG-7.44, CO2-32.9, PO2-403, bicarb-22.3.   Plan of Care (if not transferred): Monitor pt, wean FiO2. Call RRT if further assistance needed.  Event Summary: Name of Physician Notified: Dr. Primitivo GauzeFletcher at  (PTA RRT)    Outcome: Stayed in room and stabalized  Event End Time : 0215  Terrilyn SaverHopper, Janeece Blok Anderson

## 2017-01-17 NOTE — Progress Notes (Addendum)
Family Medicine Progress Note  Saw and examined patient. Apparently had episode of desat down to 60s and HR fell to 30-40s. Patient was responsive the whole time and in no acute distress per reports. Placed on non-rebreather.  When entered room patient on non-rebreather. Pulse ox not picking up. ABG drawn and bedside. Stat chest xray ordered. Somewhat limited by patient position. No increased PTX. Slight atelectasis Bilateral lower lobes.  Coarse breath sounds bilaterally with R>L. Did not appreciate diminished breath sounds in setting of water-sealed chest tube. Patient with HR in 80s and 90s while in the room. Patient not altered during exam. Patient said that he was not short of breath, although unclear how reliable he is at this time. Patient satting 100% on ear probe. Taken off non-rebreather. Placed on 4L [place]. Satting 98% when left room. ABG showing 7.45/33/403/22 while on non-rebreather.  Myrene BuddyJacob Demetrion Wesby MD PGY-1 Family Medicine Resident

## 2017-01-17 NOTE — Consult Note (Signed)
Consultation Note Date: 01/17/2017   Patient Name: Jay Stephens  DOB: 11/12/1945  MRN: [id number]  Age / Sex: 71 y.o., male  PCP: System, Pcp Not In Referring Physician: Moses MannersHensel, William A, MD  Reason for Consultation: Establishing goals of care  HPI/Patient Profile: 71 y.o. male  With an unknown past medical history who was admitted on 2016/09/27 with altered mental status and weakness. Ct shows a large loculated hydropneumothorax and hx of craniotomy and negative for stroke. He was intubated on 7/24, extubated on 7/27 and placed on a non-rebreather this morning. Currently he is on room air with O2 saturation of 93%. Chest tube was placed on 7/24.   He denies any pain or difficulty breathing at this time.   Clinical Assessment and Goals of Care:  I have reviewed medical records including EPIC notes, labs and imaging, assessed the patient. Attempted to make contact with the nephew on 7/30 at 11:58 am. A message and return phone number was left. A second call was made at 3:12 pm and another message was left. Awaiting return phone call to schedule a meeting  to discuss diagnosis prognosis, GOC, EOL wishes, disposition and options.   I introduced Palliative Medicine as specialized medical care for people living with serious illness. It focuses on providing relief from the symptoms and stress of a serious illness. The goal is to improve quality of life for both the patient and the family.  We discussed a brief life review of the patient. He is [place]. He is unsure of when he moved to the [place] area. He lives in a motel with his nephew and nephew's son.  He is unable to tell us what type of work he did previously in [place].  He is unable to describe why he is in the hospital.    As far as functional and nutritional status, unknown at this time. He did have a feeding tube in place  but has sense removed it himself. According to speech therapy he can tolerate nectar thick liquids. He is a poor candidate for artificial feeding.    Primary Decision Maker:  NEXT OF KIN- [name] Grayson (Nephew)    SUMMARY OF RECOMMENDATIONS    PMT will continue to attempt to reach the family in order to have a GOC discussion.  Code Status/Advance Care Planning:  Full Code   Symptom Management:   Continue per primary team   Additional Recommendations (Limitations, Scope, Preferences):   Full Scope Treatment  Prognosis:   Months due to severe infection, severe malnutrition, can't ambulate, respiratory failure, decreased cognition.  He is at high risk for an acute event causing sudden decline.   Discharge Planning: Skilled Nursing Facility for rehab with Palliative care service follow-up      Primary Diagnoses: Present on Admission: . Altered mental status   I have reviewed the medical record, interviewed the patient and family, and examined the patient. The following aspects are pertinent.  History reviewed. No pertinent past medical history. Social History  Social History  . Marital status: Unknown    Spouse name: N/A  . Number of children: N/A  . Years of education: N/A   Social History Main Topics  . Smoking status: None  . Smokeless tobacco: None  . Alcohol use None  . Drug use: Unknown  . Sexual activity: Not Asked   Other Topics Concern  . None   Social History Narrative  . None   No family history on file. Scheduled Meds: . folic acid  1 mg Intravenous Daily  . heparin subcutaneous  5,000 Units Subcutaneous Q8H  . mouth rinse  15 mL Mouth Rinse BID  . multivitamin with minerals  1 tablet Oral Daily  . sodium chloride flush  3 mL Intravenous Q12H  . thiamine injection  100 mg Intravenous Daily   Continuous Infusions: .  ceFAZolin (ANCEF) IV Stopped (01/17/17 1001)  . dextrose 5 % and 0.45 % NaCl with KCl 20 mEq/L 75 mL/hr at 01/17/17 0244    PRN Meds:.labetalol, LORazepam, RESOURCE THICKENUP CLEAR No Known Allergies Review of Systems patient has decreased cognition.  Has no complaints.  Physical Exam Patient is lying in bed appears thin and chronically ill. Answering simple questions but has difficulty anwsering complex questions. Responds, " I dont know" frequently.  CV: tachycardic, regular rhythm. Murmur appreciated  Pulm: Right chest tube in place. Rough breath sounds present bilaterally.  Abd: bowel sounds present.  Neuro: Decreased grip strength and plantar flexion strength bilaterally.   Vital Signs: BP (!) 143/78   Pulse 99   Temp 98.3 F (36.8 C) (Oral)   Resp (!) 22   Ht 5\' 9"  (1.753 m)   Wt 62.6 kg (138 lb)   SpO2 93%   BMI 20.38 kg/m  Pain Assessment: No/denies pain   Pain Score: 0-No pain   SpO2: SpO2: 93 % O2 Device:SpO2: 93 % O2 Flow Rate: .O2 Flow Rate (L/min): 6 L/min  IO: Intake/output summary:  Intake/Output Summary (Last 24 hours) at 01/17/17 1247 Last data filed at 01/17/17 1016  Gross per 24 hour  Intake           1888.5 ml  Output             1150 ml  Net            738.5 ml    LBM: Last BM Date: 01/16/17 Baseline Weight: Weight: 45.4 kg (100 lb) Most recent weight: Weight: 62.6 kg (138 lb)     Palliative Assessment/Data: 30%     Time In: 10:00 Time Out: 10:30 Time Total: 30 min  Greater than 50%  of this time was spent counseling and coordinating care related to the above assessment and plan.  Signed by: Caryl NeverMadison Tedrow, PA-S  Norvel RichardsMarianne Sacora Hawbaker, PA-C Palliative Medicine Pager: [phone]  Please contact Palliative Medicine Team phone at [phone] for questions and concerns.  For individual provider: See Loretha StaplerAmion

## 2017-01-17 NOTE — Progress Notes (Signed)
Family Medicine Progress Note  Reviewed 1800 cxr after chest tube pull. Apical PTX looks slightly worse by my read. Unclear if any air entered cavity [facility]. Will start on 2L [place] to aid in reabsorption. CXR ordered for 5am on 7/31. Patient tachy up to high 90s, low 100s. Will watch overnight, no change in care plan indicated at this time.  Myrene BuddyJacob Ender Rorke MD PGY-1 Family Medicine Resident
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
## 2017-01-17 NOTE — Progress Notes (Signed)
Family Medicine Teaching Service Daily Progress Note Intern Pager: [phone]  Patient name: Jay Stephens Medical record number: [id number] Date of birth: 09/21/1945 Age: 71 y.o. Gender: male  Primary Care Provider: System, Pcp Not In Consultants: CCM, Neurology Code Status: FULL  Pt Overview and Major Events to Date:  Admitted on 10-15-16 for AMS.  Intubated d/t respiratory distress and transferred to CCM on 7/24.  Extubated and transferred to FPTS on 01/14/17.  Assessment and Plan:  Jay Stephens is a 71 y.o. male with unknown PMH presenting with apparent altered mental status, R facial droop, and generalized weakness, found to have empyema in right side of chest.  Altered Mental Status with R facial droop and weakness:  Last seen normal by his nephew in pm of 7/22. Patient apparently AAOx4 and able to do all ADLs at baseline, although physical appearance does not suggest this. Patient with no known medical history.  CT scan (-) for acute stroke, but is positive for partial craniotomy with gliosis in right temporal lobe. UDS and alcohol (-).  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  Patient with very large empyema of right lung. Chest tube placed 7/24 with purulent output. Grew out strep viridans. Patient on antibiotics as follows: zosyn (7/24-7/28), vanc (7/24-7/25), ancef (7/28-)  Blood cultures negative x 4 days. Patient with chest tube in place with 7/27-7/28 output of 55600mL/24hrs. Patient passed swallow study on 7/29, now getting dysphagia 1 with honey thick liquids. R sided facial droop is likely re-emergence of old symptoms due to stress from current illness per neurology. Patient continues to pull at lines, tubes. Continue mittens. Patient has improved a lot from admission; currently AOx3, GCS 15. - Vitals signs per step-down unit, pulse ox with vitals - daily BMP, CBC - continue give B1, folic acid - continue to  monitor mental status - OT/PT/SLP following - dysphagia I, honey thick liquids - Ativan 1-4 mg Q4H PRN for CIWA > 8 - continue ancef - continue D51/2NS with 20 mEq/L KCl @75  until tolerating PO well  Right sided Empyema: CT chest confirmed large loculated hydropneumothorax on right chest as well as nonvisualization right upper lobe bronchus concerning for obstruction.  Chest tube placed on 7/24 drained 3L pus from right chest empyema.  Culture of empyema fluid positive for numerous Strep viridans bacteria, which could be related to his tooth abscess.  Extubated successfully on 01/14/17.  Repeat CXR on 01/15/17 shows improved right apical pneumothorax.  WBC is 5.4.  Patient remains afebrile. CT with 500mL out on 7/27-7/28. Due to strep viridans and known tooth abscess, have consulted Oral and maxillofacial surgery. They have requested a panoramic view of face. Tentative surgery scheduled for 8/1. Chest tube to waterseal on 7/29. cxr stable from overnight. Likely pulled by ccm on 7/30. Patient with desat down to 60s in early am of 7/30. cxr stable, abg 12/24/30/403/22. Likely due to equipment malfunction. Currently satting 98% on room air. - continue ceftriaxone - monitor respiratory status - Graves as needed to keep sats above 92% - Appreciate CCM recs, likely pull chest tube today   Tooth abscess: Poor dentition with periapical abscess around a left maxillary tooth (likely tooth number 11) seen on head CT.  Could be source of strep viridans in empyema. Due to strep viridans and known tooth abscess, have consulted Oral and maxillofacial surgery. They have requested a panoramic view of face. Tentative surgery scheduled for 8/1. - Oral surgery following, appreciate  their recs - Follow up orthopantogram per their request   Electrolyte abnormalities; Hypernatremia, hyperchloremia: Likely from dehydration, although electrolytes have been stable and remain elevated even with continued hydration.  Sodium 141,  Chloride 112 on 7/30. - Monitor for s/s of hypernatremia and hyperchloremia - Daily BMP - D51/2NS @ 75 mL/hr  Hypocalcemia Patient with low ionized calcium. Can replete with calcium gluconate if needed, although will opt to just watch for now. Calcium 7.2, corrected calcium 9.3. Serum calcium not low when corrected for poor albumin level. Consider ical if symptomatic. - daily bmp   Protein Calorie Malnutrition  Patient with albumin of 2.0 on admission and last albumin measured on 7/25 of 1.3, marked temporal wasting, weight of 100lbs. Patient very thin and very frail looking on exam. Will need protein supplementation. IV fluids for now given NPO status. Passed swallow study on 7/29. Getting dysphagia diet with honey thick liquids. - Nutrition consult - fluids as above  Uricemia Patient with bun of 38 on admission, now 12. Most likely due to muscle wasting prior to admission - will continue to watch - daily bmp - fluids as above  Elevated Transaminases Elevated AST up to 79 on admission, now 61. Patient with unknown history of alcohol use or any liver disorder.  -Will continue to trend   PMH is unknown  FEN/GI: dysphagia I, honey thick liquids Prophylaxis: pantoprazole, SCDs   Disposition: SNF, will work with CSW on arranging this  Subjective:  Doing well this morning. No complaints, just wishes he could drink water. AOx3. Oriented to self, place, and situation. NPO. Slow to respond.  Objective: Temp:  [97.7 F (36.5 C)-98.3 F (36.8 C)] 98.3 F (36.8 C) (07/30 0515) Pulse Rate:  [76-115] 115 (07/30 0621) Resp:  [20-22] 22 (07/30 0515) BP: (133-165)/(89-97) 165/97 (07/30 0515) SpO2:  [92 %-100 %] 93 % (07/30 0621) Weight:  [138 lb (62.6 kg)] 138 lb (62.6 kg) (07/30 0500) Physical Exam: General: thin, frail, chronically-ill appearing man with temporal wasting Cardiovascular: RRR, no MRG, palpable radial pulse BUE. Respiratory: coarse rhonchi bilaterally, chest  tube in place, draining purulent fluid. To waterseal today. Satting 98% on room air. Abdomen: +bowel sounds, nontender Extremities: thin, no lesions noted  Laboratory:  Recent Labs Lab 01/15/17 0910 01/16/17 0719 01/17/17 0443  WBC 3.9* 4.2 4.6  HGB 12.3* 11.0* 10.2*  HCT 39.7 34.2* 33.3*  PLT 80* 71* 86*    Recent Labs Lab 01/15/2017 1618  01/11/17 1002 01/12/17 0305  01/15/17 0910 01/16/17 0552 01/17/17 0443  NA 146*  < > 149* 149*  < > 149* 142 141  K 4.3  < > 4.8 3.6  < > 4.2 4.7 4.2  CL 113*  < > 121* 126*  < > 115* 113* 112*  CO2 19*  --  13* 15*  < > 24 22 23   BUN 39*  < > 50* 42*  < > 14 14 12   CREATININE 1.45*  < > 1.35* 1.35*  < > 0.85 0.90 0.89  CALCIUM 8.3*  --  7.6* 7.0*  < > 7.6* 7.1* 7.2*  PROT 6.7  --  6.0* 4.7*  --   --   --   --   BILITOT 2.0*  --  1.9* 2.0*  --   --   --   --   ALKPHOS 75  --  63 51  --   --   --   --   ALT 46  --  53 39  --   --   --   --  AST 79*  --  80* 61*  --   --   --   --   GLUCOSE 213*  < > 186* 130*  < > 129* 194* 231*  < > = values in this interval not displayed.   Imaging/Diagnostic Tests: Ct Chest W Contrast  Result Date: 01/17/2017 CLINICAL DATA:  Admitted with aspiration pneumonia and septic shock. Empyema. Concern for right upper lobe mass on admission chest CT. EXAM: CT CHEST WITH CONTRAST TECHNIQUE: Multidetector CT imaging of the chest was performed during intravenous contrast administration. CONTRAST:  75mL ISOVUE-300 IOPAMIDOL (ISOVUE-300) INJECTION 61% COMPARISON:  01-24-2017 FINDINGS: Cardiovascular: No cardiomegaly. No pericardial effusion. There is thinning of the lateral and inferior wall left ventricle consistent with remote infarct. Atherosclerotic calcification of the coronaries is extensive. No acute vascular finding. Mediastinum/Nodes: Asymmetric enlargement of right hilar lymph nodes no measuring up to 14 mm. Sub- carinaladenopathy is also present. These are nonspecific in this setting. Lungs/Pleura:  Significantly decreased empyemawith resolved mediastinal shift. Small pockets of fluid are seen along the lateral mid chest and in the mid fissures. Residual fissural fluid measures up to 3 cm in length. Lateral chest wall fluid measures up to 14 mm in thickness. Patchy ground-glass and consolidative opacities in the right lung, greatest in the lower lobe. No visible cavitary focus. Small pneumothorax, estimated at 10%. Right basilar chest tube is partly covered, unremarkable where seen. There is a small to moderate layering left pleural effusion without definitive loculation. No pneumonia seen on the left. Centrilobular emphysema. Initially there is concern for central airway lesion; obstructing lesion is not seen today. Upper Abdomen: Partly seen ascites. There is also retroperitoneal edema which creates an indistinct appearance of the gastric wall. Musculoskeletal: No acute or aggressive finding IMPRESSION: 1. Significantly improved right-sided empyema. Small pockets of residual fluid are seen along the lateral chest wall and fissures. Pneumothorax is small and stable from chest x-ray earlier today. 2. Pneumonia and atelectasis on the right, greatest in the lower lobe. 3. Diminished concern for obstructing airway lesion when compared to admission CT, when there was bronchial distortion due to the pleural effusion. Recommend three-month follow-up CT in this smoker with nonspecific adenopathy. 4. Small upper abdominal ascites that is newly seen. 5. Aortic Atherosclerosis (ICD10-I70.0) and Emphysema (ICD10-J43.9). 6. 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 Electronically Signed   By: Marnee Spring M.D.   On: 01/17/2017 07:22   Dg Chest Port 1 View  Result Date: 01/17/2017 CLINICAL DATA:  Oxygen desaturation EXAM: PORTABLE CHEST 1 VIEW COMPARISON:  CT chest 01/16/2017.  Chest 01/16/2017. FINDINGS: Shallow inspiration. Atelectasis or infiltration in the right lung base and in  the left lung base. Small residual right pneumothorax with right chest tube in place. Cardiac enlargement without vascular congestion. Tortuous aorta. No significant changes since previous study, lying for differences in technique. IMPRESSION: Shallow inspiration with atelectasis or infiltration in the lung bases. Small residual right pneumothorax with right chest tube in place. Cardiac enlargement. Electronically Signed   By: Burman Nieves M.D.   On: 01/17/2017 02:23   Dg Chest Port 1 View  Result Date: 01/16/2017 CLINICAL DATA:  Empyema EXAM: PORTABLE CHEST 1 VIEW COMPARISON:  01/15/2017 FINDINGS: O right basilar chest tube remains in place. Stable small right apical and medial pneumothorax. Diffuse airspace disease throughout the right lung, slightly improved. Small right effusion. Minimal left base atelectasis or infiltrate, stable. IMPRESSION: No change since prior study. Electronically Signed   By: Charlett Nose  M.D.   On: 01/16/2017 07:43   Dg Chest Port 1 View  Result Date: 01/15/2017 CLINICAL DATA:  Empyema, extubation EXAM: PORTABLE CHEST 1 VIEW COMPARISON:  01/14/2017 FINDINGS: Interval removal of endotracheal tube, right central line and NG tube. Diffuse airspace disease throughout the right lung is stable. Small right apical pneumothorax, 5-10% slightly decreased since prior study. Right basilar chest tube remains in place. Mild cardiomegaly. No confluent opacity on the left. IMPRESSION: Small right apical pneumothorax, slightly decreased since prior study. Continued diffuse right lung airspace disease. Electronically Signed   By: Charlett Nose M.D.   On: 01/15/2017 08:00   Dg Chest Port 1 View  Result Date: 01/14/2017 CLINICAL DATA:  Empyema. EXAM: PORTABLE CHEST 1 VIEW COMPARISON:  01/13/2017. FINDINGS: Endotracheal tube, NG tube, right IJ line, right chest tube in stable position. Stable small right pneumothorax unchanged. Stable mild right pleural thickening again noted. Diffuse right  lung infiltrate unchanged. Heart size normal. IMPRESSION: 1. Lines and tubes including right chest tube in stable position. Stable small right pneumothorax is unchanged. Stable mild right pleural thickening again noted . 2. Diffuse right lung infiltrate unchanged. Electronically Signed   By: Maisie Fus  Register   On: 01/14/2017 06:56   Dg Abd Portable 1v  Result Date: 01/15/2017 CLINICAL DATA:  Feeding tube placement EXAM: PORTABLE ABDOMEN - 1 VIEW COMPARISON:  None. FINDINGS: Feeding tube tip is in the proximal stomach. Right basilar chest tube noted with right pneumothorax noted medially. IMPRESSION: Feeding tube tip in the proximal stomach. Electronically Signed   By: Charlett Nose M.D.   On: 01/15/2017 12:16   Dg Swallowing Func-speech Pathology  Result Date: 01/16/2017 Objective Swallowing Evaluation: Type of Study: MBS-Modified Barium Swallow Study Patient Details Name: Jay Stephens MRN: [id number] Date of Birth: 04-May-1946 Today's Date: 01/16/2017 Time: SLP Start Time (ACUTE ONLY): 1040-SLP Stop Time (ACUTE ONLY): 1100 SLP Time Calculation (min) (ACUTE ONLY): 20 min Past Medical History: No past medical history on file. Past Surgical History: No past surgical history on file. HPI: Mr. Goshert is a 70yo male with unknown PMH per EMR who was admitted to [facility] 7/23 for AMS, R facial droop, and generalized weakness. Last seen normal by nephew 7/22 PM. Per nephew, apparently A&Ox4 at baselineand able to someADLs but requires some assistance. CT head negative for stroke but with area of encephalomalacia/gliosis in R temporal lobe. CT chest with large loculated hydropneumothorax on R chest and nonvisualization of RUL bronchus. Intubated 7/24-7/27. CXR 7/28 showed diffuse right lung infiltrate. No Data Recorded Assessment / Plan / Recommendation CHL IP CLINICAL IMPRESSIONS 01/16/2017 Clinical Impression Pt currently presenting with a moderate oropharyngeal dysphagia impacted by sensorimotor function post-intubation and  cognitive status. Pt with premature spill to the level of the pyriform sinuses along with reduced hyolaryngeal excursion resulting in reduced airway protection and silent aspiration of thin liquids during the swallow. Pt with very weak cough and unable to completely clear laryngeal residuals. Pt also with mild-severe residuals in the vallecula and pyriform sinuses post-swallow across consistencies which were inconsistent. As the study progressed, the pt appeared to clear residuals more effectively and was cued to "swallow hard". Cognitively unable to complete any other maneuvers or strategies. Pt tolerated nectar-thick liquids by tsp without penetration or aspiration noted; pt unable to control sip size and straw sips resulted in more significant pharyngeal residuals. Recommend initiating dysphagia 1 diet, nectar-thick liquids by teaspoon only, meds crushed in puree, full supervision to assist with feeding/ small bites and sips/ cue pt  to swallow hard with each bolus. Will continue to follow for diet tolerance/ consider advancement as cognitive status improves. SLP Visit Diagnosis Dysphagia, unspecified (R13.10) Attention and concentration deficit following -- Frontal lobe and executive function deficit following -- Impact on safety and function Moderate aspiration risk   CHL IP TREATMENT RECOMMENDATION 01/16/2017 Treatment Recommendations Therapy as outlined in treatment plan below   Prognosis 01/16/2017 Prognosis for Safe Diet Advancement Good Barriers to Reach Goals Cognitive deficits Barriers/Prognosis Comment -- CHL IP DIET RECOMMENDATION 01/16/2017 SLP Diet Recommendations Dysphagia 1 (Puree) solids;Nectar thick liquid Liquid Administration via Spoon Medication Administration Crushed with puree Compensations Minimize environmental distractions;Slow rate;Small sips/bites;Effortful swallow Postural Changes Seated upright at 90 degrees   CHL IP OTHER RECOMMENDATIONS 01/16/2017 Recommended Consults -- Oral Care  Recommendations Oral care BID Other Recommendations Order thickener from pharmacy;Remove water pitcher;Clarify dietary restrictions   CHL IP FOLLOW UP RECOMMENDATIONS 01/16/2017 Follow up Recommendations 24 hour supervision/assistance   CHL IP FREQUENCY AND DURATION 01/16/2017 Speech Therapy Frequency (ACUTE ONLY) min 3x week Treatment Duration 2 weeks      CHL IP ORAL PHASE 01/16/2017 Oral Phase Impaired Oral - Pudding Teaspoon -- Oral - Pudding Cup -- Oral - Honey Teaspoon -- Oral - Honey Cup -- Oral - Nectar Teaspoon Delayed oral transit;Premature spillage Oral - Nectar Cup -- Oral - Nectar Straw Lingual/palatal residue;Piecemeal swallowing;Delayed oral transit;Premature spillage Oral - Thin Teaspoon Delayed oral transit;Premature spillage Oral - Thin Cup -- Oral - Thin Straw Delayed oral transit;Premature spillage Oral - Puree Delayed oral transit;Premature spillage Oral - Mech Soft -- Oral - Regular -- Oral - Multi-Consistency -- Oral - Pill -- Oral Phase - Comment --  CHL IP PHARYNGEAL PHASE 01/16/2017 Pharyngeal Phase Impaired Pharyngeal- Pudding Teaspoon -- Pharyngeal -- Pharyngeal- Pudding Cup -- Pharyngeal -- Pharyngeal- Honey Teaspoon -- Pharyngeal -- Pharyngeal- Honey Cup -- Pharyngeal -- Pharyngeal- Nectar Teaspoon Delayed swallow initiation-vallecula;Delayed swallow initiation-pyriform sinuses;Reduced epiglottic inversion;Reduced anterior laryngeal mobility;Reduced laryngeal elevation;Reduced tongue base retraction;Pharyngeal residue - valleculae Pharyngeal -- Pharyngeal- Nectar Cup -- Pharyngeal -- Pharyngeal- Nectar Straw Delayed swallow initiation-pyriform sinuses;Reduced epiglottic inversion;Reduced anterior laryngeal mobility;Reduced laryngeal elevation;Reduced tongue base retraction;Pharyngeal residue - valleculae;Pharyngeal residue - pyriform Pharyngeal -- Pharyngeal- Thin Teaspoon Delayed swallow initiation-pyriform sinuses;Reduced epiglottic inversion;Reduced anterior laryngeal mobility;Reduced  laryngeal elevation;Reduced tongue base retraction;Pharyngeal residue - valleculae Pharyngeal -- Pharyngeal- Thin Cup -- Pharyngeal -- Pharyngeal- Thin Straw Delayed swallow initiation-pyriform sinuses;Reduced epiglottic inversion;Reduced anterior laryngeal mobility;Reduced laryngeal elevation;Reduced airway/laryngeal closure;Reduced tongue base retraction;Penetration/Aspiration during swallow;Pharyngeal residue - valleculae;Pharyngeal residue - pyriform Pharyngeal Material enters airway, passes BELOW cords without attempt by patient to eject out (silent aspiration) Pharyngeal- Puree Delayed swallow initiation-vallecula;Delayed swallow initiation-pyriform sinuses;Reduced epiglottic inversion;Reduced anterior laryngeal mobility;Reduced laryngeal elevation;Reduced tongue base retraction;Pharyngeal residue - valleculae;Pharyngeal residue - pyriform Pharyngeal -- Pharyngeal- Mechanical Soft -- Pharyngeal -- Pharyngeal- Regular -- Pharyngeal -- Pharyngeal- Multi-consistency -- Pharyngeal -- Pharyngeal- Pill -- Pharyngeal -- Pharyngeal Comment --  CHL IP CERVICAL ESOPHAGEAL PHASE 01/16/2017 Cervical Esophageal Phase Impaired Pudding Teaspoon -- Pudding Cup -- Honey Teaspoon -- Honey Cup -- Nectar Teaspoon WFL Nectar Cup -- Nectar Straw Reduced cricopharyngeal relaxation Thin Teaspoon WFL Thin Cup -- Thin Straw Reduced cricopharyngeal relaxation Puree Reduced cricopharyngeal relaxation Mechanical Soft -- Regular -- Multi-consistency -- Pill -- Cervical Esophageal Comment -- No flowsheet data found. Amy Cecille Aver, MA, CCC-SLP 01/16/2017, 11:26 AM [id number]              Myrene Buddy, MD 01/17/2017, 9:23 AM PGY-1, [facility] Family [facility] FPTS Intern pager: [phone], text  pages welcome

## 2017-01-18 ENCOUNTER — Ambulatory Visit: Payer: Self-pay | Admitting: Oral Surgery

## 2017-01-18 ENCOUNTER — Inpatient Hospital Stay (HOSPITAL_COMMUNITY): Payer: Medicare Other

## 2017-01-18 ENCOUNTER — Encounter (HOSPITAL_COMMUNITY): Payer: Self-pay

## 2017-01-18 DIAGNOSIS — Z515 Encounter for palliative care: Secondary | ICD-10-CM

## 2017-01-18 LAB — BASIC METABOLIC PANEL
Anion gap: 3 — ABNORMAL LOW (ref 5–15)
BUN: 10 mg/dL (ref 6–20)
CALCIUM: 7 mg/dL — AB (ref 8.9–10.3)
CO2: 24 mmol/L (ref 22–32)
Chloride: 110 mmol/L (ref 101–111)
Creatinine, Ser: 0.78 mg/dL (ref 0.61–1.24)
GFR calc Af Amer: >60 mL/min (ref >60)
GLUCOSE: 163 mg/dL — AB (ref 65–99)
Potassium: 4.1 mmol/L (ref 3.5–5.1)
Sodium: 137 mmol/L (ref 135–145)

## 2017-01-18 LAB — BODY FLUID CULTURE

## 2017-01-18 LAB — CBC
HCT: 27.7 % — ABNORMAL LOW (ref 39.0–52.0)
HEMOGLOBIN: 8.8 g/dL — AB (ref 13.0–17.0)
MCH: 26.2 pg (ref 26.0–34.0)
MCHC: 31.8 g/dL (ref 30.0–36.0)
MCV: 82.4 fL (ref 78.0–100.0)
PLATELETS: 96 10*3/uL — AB (ref 150–400)
RBC: 3.36 MIL/uL — AB (ref 4.22–5.81)
RDW: 14.5 % (ref 11.5–15.5)
WBC: 4.5 10*3/uL (ref 4.0–10.5)

## 2017-01-18 LAB — HEMOGLOBIN AND HEMATOCRIT, BLOOD
HEMATOCRIT: 31.9 % — AB (ref 39.0–52.0)
Hemoglobin: 9.8 g/dL — ABNORMAL LOW (ref 13.0–17.0)

## 2017-01-18 LAB — GLUCOSE, CAPILLARY
GLUCOSE-CAPILLARY: 181 mg/dL — AB (ref 65–99)
GLUCOSE-CAPILLARY: 200 mg/dL — AB (ref 65–99)
GLUCOSE-CAPILLARY: 194 mg/dL — AB (ref 65–99)
Glucose-Capillary: 157 mg/dL — ABNORMAL HIGH (ref 65–99)
Glucose-Capillary: 187 mg/dL — ABNORMAL HIGH (ref 65–99)
Glucose-Capillary: 188 mg/dL — ABNORMAL HIGH (ref 65–99)

## 2017-01-18 MED ORDER — NICOTINE 21 MG/24HR TD PT24
21.0000 mg | MEDICATED_PATCH | Freq: Every day | TRANSDERMAL | Status: DC
  Administered 2017-01-18 – 2017-01-21 (×4): 21 mg via TRANSDERMAL
  Filled 2017-01-18 (×4): qty 1

## 2017-01-18 MED ORDER — FOLIC ACID 1 MG PO TABS
1.0000 mg | ORAL_TABLET | Freq: Every day | ORAL | Status: DC
  Administered 2017-01-18 – 2017-01-21 (×3): 1 mg via ORAL
  Filled 2017-01-18 (×3): qty 1

## 2017-01-18 MED ORDER — VITAMIN B-1 100 MG PO TABS
100.0000 mg | ORAL_TABLET | Freq: Every day | ORAL | Status: DC
  Administered 2017-01-18 – 2017-01-21 (×3): 100 mg via ORAL
  Filled 2017-01-18 (×3): qty 1

## 2017-01-18 MED ORDER — IOPAMIDOL (ISOVUE-300) INJECTION 61%
INTRAVENOUS | Status: AC
  Administered 2017-01-18: 75 mL via INTRAVENOUS
  Filled 2017-01-18: qty 75

## 2017-01-18 NOTE — Discharge Summary (Signed)
Family Medicine [facility] Discharge Summary  Patient name: Jay HibbsJessie Stephens Medical record number: [id number] Date of birth: 06/19/1946 Age: 71 y.o. Gender: male Date of Admission: 01/11/2017  Date of Discharge: 02/08/2017  Admitting Physician: Nelda Bucksaniel J Feinstein, MD  Primary Care Provider: System, Pcp Not In Consultants: neurology, CCM, Oral surgery  Indication for Hospitalization: altered mental status, right sided empyema  Discharge Diagnoses/Problem List:  Altered Mental Status Right Sided empyema Tooth abscess Hypernatremia Hyperchloremia Protein calorie malnutrition Uricemia Elevated transaminases  Disposition: snf  Discharge Condition: stable  Discharge Exam:  General: frail appearing,alert but not oriented, conversational Cardiovascular: RRR, no murmurs noted Respiratory: decreased breath sounds on RLL, dressing from recent CT drain was dry but slightly stained Abdomen: No tenderness to palpation, soft belly, bowel sounds in all 4 quadrants Extremities: no complaints of pain, decreased strength/slow movements bilaterally likely due to frailty Oral wound: superior central, nonbleeding currently, stitches inplace Sacral pressure ulcer: app. 3cm diameter stage 2 w/ dressing in place  Brief Hospital Course:  Patient dropped off at ed with altered mental status, new onset facial droop on 7/23. Workup in ed included Ct scan of the head revealed no stroke. Did have an area of encephalomalacia/gliosis in the right temporal lobe. Poor dentition with periapical abscess around tooth 11. BMP was obtained which showed sodium 149 (up from 146 at admission). Elevated chloride of 115, glucose of 205, bun 38, creatinine of 1.30 (down from 1.45). A cbc was obtained which showed a hemoglobin of 11.2 and 36.4 (up to 12.9/38 at recheck). An ekg was obtained which showed st segment elevation in numerous segments. A chest xray was obtained which showed an opacification in RML, RLL. An exam  patient is able to move all four extremities. He is able to follow simple commands with some prompting. He can speak simple words that are appropriate. GCS 13 at time of exam. A Chest CT was obtained which showed layering hydroptx on R side.  Patient was admitted to stepdown initially. On morning of 7/24 he developed increasing trouble protecting airway. He was transferred to ccm and intubated at that time. A chest tube was placed which produced 3L of purulent fluid after placement. He was also placed on pressure support at that time. He was started on vanc/zosyn at that time. Cultures from chest tube grew out strep viridans. He was gradually weaned off of pressure support and sedation until 7/27. He was extubated at that time and placed in a stepdown bed. During his time in the ICU he received an MRI which revealed no stroke, and an echo which showed eF 55-60%. He was switched to ceftriaxone on 7/28. This is is to be continued for a total of 14 days of abx. Oral surgery was consulted and an orthopantogram was ordered for 7/31. He underwent tooth extraction with abscess I&D and resorbable sutures were used.  8/2 it was noted that he has a stage 2 sacral pressure ulcer.   Issues for Follow Up:  1. CT chest notable for possible obstructing airway lesion when compared to admission CT, when there was bronchial distortion due to the pleural effusion. Recommend three-month follow-up CT in this smoker with nonspecific adenopathy. 2. Please see nutritionist for Specific recommendations regarding feeding, patient may require assistance to feed and was on a pureed diet at discharge 3.  Stage 2 pressure ulcer on sacrum, currently clean/dressed 4.  Dental abscess surgery on superior central tooth (10-11) with reabsorbing sutures, needs chlorhexadine mouthwash TID until 8/15 5.  Right sided chest tube was removed but dressing will need to be inspected and cared for. 6.   Currenlty on 2L O2 by nasal canula as  treatment for asymptomatic minor pneumothorax 7.   Please monitor mag/phos/K for refeeding syndrome 8. Antibiotics: Discharged on day 8/14, Augmentin BID with last dose scheduled for 8/9  Significant Procedures: chest tube placement, abscess I&D, tooth extraction, echo  Significant Labs and Imaging:   Recent Labs Lab 01/20/2017 0353 01/20/17 0405 02/09/2017 0441  WBC 5.0 5.2 5.0  HGB 9.3* 9.2* 9.9*  HCT 29.8* 29.5* 31.9*  PLT 104* 122* 148*    Recent Labs Lab 01/17/17 0443 01/18/17 0346 01/20/2017 0353 01/20/17 0405 01/20/2017 0342 02/06/2017 1227  NA 141 137 135 136 135  --   K 4.2 4.1 4.7 4.5 4.5  --   CL 112* 110 107 107 106  --   CO2 23 24 22 25 23   --   GLUCOSE 231* 163* 246* 157* 152*  --   BUN 12 10 9  5* 5*  --   CREATININE 0.89 0.78 0.75 0.72 0.72  --   CALCIUM 7.2* 7.0* 7.1* 7.2* 7.3*  --   MG  --   --   --   --   --  1.4*  PHOS  --   --   --   --   --  2.7      Results/Tests Pending at Time of Discharge: N/A  Discharge Medications:  Allergies as of 02/08/2017   No Known Allergies     Medication List    TAKE these medications   amoxicillin-clavulanate 875-125 MG tablet Commonly known as:  AUGMENTIN Take 1 tablet by mouth 2 (two) times daily.       Discharge Instructions: Please refer to Patient Instructions section of EMR for full details.  Patient was counseled important signs and symptoms that should prompt return to medical care, changes in medications, dietary instructions, activity restrictions, and follow up appointments.   Follow-Up Appointments: Contact information for after-discharge care    Destination    [facility] .   Specialty:  Skilled Nursing Facility Contact information: [address] [place] [id number] [phone]               02/01/2017, 1:46 PM  Marthenia RollingScott Shamira Toutant, DO PGY-1, [facility]

## 2017-01-18 NOTE — Progress Notes (Signed)
Physical Therapy Cancellation Note  Patient unable to tolerate sitting up in recliner due to pain from rectal tube. Therapist requested geomat cushion be ordered for pt to hopefully be able to work more on being OOB. Current plan remains appropriate.    01/18/17 1618  PT Visit Information  Last PT Received On 01/18/17  Assistance Needed +2  History of Present Illness 71 year old admitted with loculated hydropneumothorax, altered mental status,noted to have possible right facial droop and right-sided weakness; neuro workup underway. Noted R side hydropneumothorax  Subjective Data  Patient Stated Goal none stated  Precautions  Precautions Fall  Restrictions  Weight Bearing Restrictions No  Pain Assessment  Faces Pain Scale 6  Pain Location buttocks   Pain Descriptors / Indicators Grimacing;Sore  Pain Intervention(s) Monitored during session;Repositioned  Cognition  Arousal/Alertness Awake/alert  Behavior During Therapy WFL for tasks assessed/performed  Overall Cognitive Status No family/caregiver present to determine baseline cognitive functioning  Area of Impairment Attention;Safety/judgement;Awareness;Following commands;Problem solving  Current Attention Level Sustained  Following Commands Follows one step commands with increased time;Follows one step commands consistently  Safety/Judgement Decreased awareness of safety;Decreased awareness of deficits  Awareness Intellectual  Problem Solving Slow processing;Decreased initiation;Difficulty sequencing;Requires verbal cues;Requires tactile cues  Bed Mobility  Overal bed mobility Needs Assistance  Bed Mobility Rolling;Sit to Sidelying  Rolling Mod assist  Sit to sidelying Mod assist;+2 for physical assistance  General bed mobility comments pt able to lie down on L side with assist mostly at bilat LE and minimally at trunk to descend; assist to roll and pt positioned toward L side with pillows  Transfers  Overall transfer level Needs  assistance  Equipment used 2 person hand held assist (face to face with use of gait belt and bed pad)  Transfers Sit to/from Stand;Stand Pivot Transfers  Sit to Stand +2 physical assistance;From elevated surface;Mod assist  Stand pivot transfers Max assist;+2 physical assistance  General transfer comment pt demonstrated improved ability to lean anteriorly to assist with offloading buttocks for positioning prior to stand; assist to power up with use of chuck pad; increased assistance required to pivot feet; cues for sequencing and technique  Modified Rankin (Stroke Patients Only)  Pre-Morbid Rankin Score 0  Modified Rankin 5  Balance  Overall balance assessment Needs assistance  Sitting-balance support Feet supported;Bilateral upper extremity supported  Sitting balance-Leahy Scale Poor  Postural control Posterior lean;Right lateral lean  Standing balance support Bilateral upper extremity supported  Standing balance-Leahy Scale Zero  General Comments  General comments (skin integrity, edema, etc.) pt unable to tolerate sitting in chair due to pain from rectal tube; therapist requested geomat cushion be ordered for pt  PT - End of Session  Equipment Utilized During Treatment Gait belt  Activity Tolerance Patient tolerated treatment well  Patient left in bed;with call bell/phone within reach;with bed alarm set;with SCD's reapplied  Nurse Communication Mobility status  PT - Assessment/Plan  PT Plan Current plan remains appropriate  PT Visit Diagnosis Unsteadiness on feet (R26.81);Muscle weakness (generalized) (M62.81);Adult, failure to thrive (R62.7)  PT Frequency (ACUTE ONLY) Min 2X/week  Follow Up Recommendations SNF;Supervision/Assistance - 24 hour  PT equipment (TBD)  AM-PAC PT "6 Clicks" Daily Activity Outcome Measure  Difficulty turning over in bed (including adjusting bedclothes, sheets and blankets)? 1  Difficulty moving from lying on back to sitting on the side of the bed?  1   Difficulty sitting down on and standing up from a chair with arms (e.g., wheelchair, bedside commode, etc,.)? 1  Help needed  moving to and from a bed to chair (including a wheelchair)? 1  Help needed walking in hospital room? 1  Help needed climbing 3-5 steps with a railing?  1  6 Click Score 6  Mobility G Code  CN  PT Goal Progression  Progress towards PT goals Progressing toward goals  Acute Rehab PT Goals  PT Goal Formulation Patient unable to participate in goal setting  Time For Goal Achievement 01/29/17  Potential to Achieve Goals Fair  PT Time Calculation  PT Start Time (ACUTE ONLY) 1450  PT Stop Time (ACUTE ONLY) 1507  PT Time Calculation (min) (ACUTE ONLY) 17 min  PT General Charges  $$ ACUTE PT VISIT 1 Visit  PT Treatments  $Therapeutic Activity 8-22 mins   Erline LevineKellyn Shakyla Nolley, PTA Pager: [phone]

## 2017-01-18 NOTE — Progress Notes (Signed)
No charge note  Nephew (HCPOA) Jay ServiceMelvin Stephens is meeting with PMT on 8/1 at 1:00 pm.  Jay RichardsMarianne Hendrix Yurkovich, PA-C Palliative Medicine Pager: [phone]

## 2017-01-18 NOTE — Progress Notes (Signed)
Patients cbg was 200 notified provider

## 2017-01-18 NOTE — Progress Notes (Addendum)
Physical Therapy Treatment Patient Details Name: Ferd HibbsJessie Donado MRN: [id number] DOB: 08/13/1945 Today's Date: 01/18/2017    History of Present Illness 71 year old admitted with loculated hydropneumothorax, altered mental status,noted to have possible right facial droop and right-sided weakness; neuro workup underway. Noted R side hydropneumothorax    PT Comments    Patient very pleasant and willing to participate in therapy. Patient required mod/max +2 for mobility this session. Pt tolerated OOB transfer this session but c/o pain from rectal tube in sitting. Pt continues to demonstrate R side weakness and posterior/R lateral lean in sitting.  Continue to progress as tolerated with anticipated d/c to SNF for further skilled PT services.     Follow Up Recommendations  SNF;Supervision/Assistance - 24 hour     Equipment Recommendations   (TBD)    Recommendations for Other Services       Precautions / Restrictions Precautions Precautions: Fall Restrictions Weight Bearing Restrictions: No    Mobility  Bed Mobility Overal bed mobility: Needs Assistance Bed Mobility: Supine to Sit;Rolling Rolling: Mod assist   Supine to sit: +2 for physical assistance;Max assist Sit to supine: Mod assist;+2 for physical assistance   General bed mobility comments: assist at bilat LE and trunk; cues for sequencing and technique with use of bed pad; pt able to maintain sidelying with use of bed rail  Transfers Overall transfer level: Needs assistance Equipment used: 2 person hand held assist (face to face with use of gait belt and bed pad) Transfers: Sit to/from Stand;Stand Pivot Transfers Sit to Stand: +2 physical assistance;Mod assist;From elevated surface Stand pivot transfers: Max assist;+2 physical assistance       General transfer comment: assist to power up into standing with R knee blocked and use of bed pad when pivoting; pt assisted by pushing up with L UE and holding onto therapist with  R UE; pt able to pivot feet with assistance  Ambulation/Gait                 Stairs            Wheelchair Mobility    Modified Rankin (Stroke Patients Only) Modified Rankin (Stroke Patients Only) Pre-Morbid Rankin Score: No symptoms Modified Rankin: Severe disability     Balance Overall balance assessment: Needs assistance Sitting-balance support: Feet supported;Bilateral upper extremity supported Sitting balance-Leahy Scale: Poor Sitting balance - Comments: mod A initially and min A with cues for anterior translation Postural control: Posterior lean;Right lateral lean Standing balance support: Bilateral upper extremity supported Standing balance-Leahy Scale: Zero                              Cognition Arousal/Alertness: Awake/alert Behavior During Therapy: WFL for tasks assessed/performed Overall Cognitive Status: No family/caregiver present to determine baseline cognitive functioning Area of Impairment: Attention;Safety/judgement;Awareness;Following commands;Problem solving                   Current Attention Level: Sustained   Following Commands: Follows one step commands with increased time;Follows one step commands consistently Safety/Judgement: Decreased awareness of safety;Decreased awareness of deficits Awareness: Intellectual Problem Solving: Slow processing;Decreased initiation;Difficulty sequencing;Requires verbal cues;Requires tactile cues        Exercises      General Comments        Pertinent Vitals/Pain Pain Assessment: Faces Faces Pain Scale: Hurts even more Pain Location: "all over" with transitional movements and buttocks when sitting up in recliner Pain Descriptors / Indicators: Grimacing;Sore Pain Intervention(s): Limited  activity within patient's tolerance;Monitored during session;Repositioned    Home Living                      Prior Function            PT Goals (current goals can now be found  in the care plan section) Acute Rehab PT Goals Patient Stated Goal: none stated PT Goal Formulation: Patient unable to participate in goal setting Time For Goal Achievement: 01/29/17 Potential to Achieve Goals: Fair Progress towards PT goals: Progressing toward goals    Frequency    Min 2X/week      PT Plan Current plan remains appropriate    Co-evaluation              AM-PAC PT "6 Clicks" Daily Activity  Outcome Measure  Difficulty turning over in bed (including adjusting bedclothes, sheets and blankets)?: Total Difficulty moving from lying on back to sitting on the side of the bed? : Total Difficulty sitting down on and standing up from a chair with arms (e.g., wheelchair, bedside commode, etc,.)?: Total Help needed moving to and from a bed to chair (including a wheelchair)?: Total Help needed walking in hospital room?: Total Help needed climbing 3-5 steps with a railing? : Total 6 Click Score: 6    End of Session Equipment Utilized During Treatment: Gait belt Activity Tolerance: Patient tolerated treatment well Patient left: in chair;with call bell/phone within reach;with chair alarm set Nurse Communication: Mobility status PT Visit Diagnosis: Unsteadiness on feet (R26.81);Muscle weakness (generalized) (M62.81);Adult, failure to thrive (R62.7)     Time: [id number] PT Time Calculation (min) (ACUTE ONLY): 31 min  Charges:  $Therapeutic Activity: 23-37 mins                    G Codes:       Erline LevineKellyn Zhyon Antenucci, PTA Pager: [phone]     Carolynne EdouardKellyn R Damara Klunder 01/18/2017, 4:14 PM

## 2017-01-18 NOTE — Care Management Important Message (Signed)
Important Message  Patient Details  Name: Jay HibbsJessie Rasor MRN: [id number] Date of Birth: 04/15/1946   Medicare Important Message Given:  Yes    Trask Vosler Abena 01/18/2017, 10:06 AM

## 2017-01-18 NOTE — Progress Notes (Signed)
  Speech Language Pathology Treatment: Dysphagia  Patient Details Name: Jay Stephens MRN: [id number] DOB: 02/19/1946 Today's Date: 01/18/2017 Time: [id number] SLP Time Calculation (min) (ACUTE ONLY): 11 min  Assessment / Plan / Recommendation Clinical Impression  Dysphagia treatment provided for diet tolerance check. Pt tolerated sips of nectar thick liquid by teaspoon and by cup with intermittent, subtle changes in vocal quality, no other overt s/s of aspiration. Upon entering room pt with mildly coarse breath sounds so this was not a reliable indicator of swallowing difficulty. Pt following directions well today; cleared throat and coughed when cued but still has a weak cough. Recommend continuing dysphagia 1 diet/ nectar thick liquids by teaspoon only, full supervision, meds crushed in puree. Pt may benefit from a repeat MBS to objectively evaluate swallow function as cognitive status seems somewhat improved from previous visits; pt had silent aspiration of thin liquids on previous MBS. Will check to consider readiness next date.   HPI HPI: Mr. Jay PlunkBest is a 71yo male with unknown PMH per EMR [facility], R facial droop, and generalized weakness. Last seen normal by nephew 7/22 PM. Per nephew, apparently A&Ox4 at baselineand able to someADLs but requires some assistance. CT head negative for stroke but with area of encephalomalacia/gliosis in R temporal lobe. CT chest with large loculated hydropneumothorax on R chest and nonvisualization of RUL bronchus. Intubated 7/24-7/27. CXR 7/28 showed diffuse right lung infiltrate.      SLP Plan  Continue with current plan of care       Recommendations  Diet recommendations: Dysphagia 1 (puree);Nectar-thick liquid Liquids provided via: Teaspoon Medication Administration: Crushed with puree Supervision: Staff to assist with self feeding;Full supervision/cueing for compensatory strategies Compensations: Minimize environmental  distractions;Slow rate;Small sips/bites Postural Changes and/or Swallow Maneuvers: Seated upright 90 degrees                Oral Care Recommendations: Oral care BID Follow up Recommendations: 24 hour supervision/assistance SLP Visit Diagnosis: Dysphagia, unspecified (R13.10) Plan: Continue with current plan of care       GO                Metro KungAmy K Lourdes Kucharski, MA, CCC-SLP 01/18/2017, 10:31 AM [id number]

## 2017-01-18 NOTE — Progress Notes (Signed)
FPTS Interim Progress Note  Received page from RN that pt unable to sit upright long enough for orthopantogram.  Planning for I&D 8/1.  Have discussed alternate imaging options with Radiology (Dr. Chestine Sporelark) and recommend a CT maxillofacial wo contrast.  Orders placed.   Freddrick MarchAmin, Musette Kisamore, MD PGY-2, [facility]

## 2017-01-18 NOTE — Progress Notes (Signed)
Family Medicine Teaching Service Daily Progress Note Intern Pager: [phone]  Patient name: Jay Stephens Medical record number: [id number] Date of birth: 01/21/1946 Age: 71 y.o. Gender: male  Primary Care Provider: System, Pcp Not In Consultants: CCM, Neurology Code Status: FULL  Pt Overview and Major Events to Date:  Admitted on 01/28/17 for AMS.  Intubated d/t respiratory distress and transferred to CCM on 7/24.  Extubated and transferred to FPTS on 01/14/17.  Assessment and Plan:  Jay Stephens is a 71 y.o. male with unknown PMH presenting with apparent altered mental status, R facial droop, and generalized weakness, found to have empyema in right side of chest.  Altered Mental Status with R facial droop and weakness:  Last seen normal by his nephew in pm of 7/22. Patient apparently AAOx4 and able to do all ADLs at baseline, although physical appearance does not suggest this. Patient with no known medical history.  CT scan (-) for acute stroke, but is positive for partial craniotomy with gliosis in right temporal lobe. UDS and alcohol (-).  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  Patient with very large empyema of right lung. Chest tube placed 7/24 with purulent output. Grew out strep viridans. Patient on antibiotics as follows: zosyn (7/24-7/28), vanc (7/24-7/25), ancef (7/28-)  Blood cultures negative x 4 days. Patient with chest tube in place with 7/27-7/28 output of 563mL/24hrs. Patient passed swallow study on 7/29, now getting dysphagia 1 with honey thick liquids. R sided facial droop is likely re-emergence of old symptoms due to stress from current illness per neurology. Patient continues to pull at lines, tubes. Continue mittens. Patient has improved a lot from admission; currently AOx3, GCS 15. - Vitals signs per step-down unit, pulse ox with vitals - daily BMP, CBC - continue give B1, folic acid - continue to  monitor mental status - OT/PT/SLP following - dysphagia I, honey thick liquids - Ativan 1-4 mg Q4H PRN for CIWA > 8 - continue ancef - continue D51/2NS with 20 mEq/L KCl @75  until tolerating PO well  Right sided Empyema: CT chest confirmed large loculated hydropneumothorax on right chest as well as nonvisualization right upper lobe bronchus concerning for obstruction.  Chest tube placed on 7/24 drained 3L pus from right chest empyema.  Culture of empyema fluid positive for numerous Strep viridans bacteria, which could be related to his tooth abscess.  Extubated successfully on 01/14/17.  Repeat CXR on 01/15/17 shows improved right apical pneumothorax.  WBC is 5.4.  Patient remains afebrile. CT with out on 7/27-7/28. Due to strep viridans and known tooth abscess, have consulted Oral and maxillofacial surgery. They have requested a panoramic view of face. Tentative surgery scheduled for 8/1. Chest tube to waterseal on 7/29. cxr stable from overnight. Chest tube pulled 7/30. Continues to sat well on room air. - continue ceftriaxone (day 8/14) - monitor respiratory status - [place] as needed to keep sats above 92%   Tooth abscess: Poor dentition with periapical abscess around a left maxillary tooth (likely tooth number 11) seen on head CT.  Could be source of strep viridans in empyema. Due to strep viridans and known tooth abscess, have consulted Oral and maxillofacial surgery. They have requested a panoramic view of face. Tentative surgery scheduled for 8/1. - Oral surgery following, appreciate their recs - Follow up orthopantogram per their request   Electrolyte abnormalities; Hypernatremia, hyperchloremia: Likely from dehydration, although electrolytes have been stable and remain elevated even with  continued hydration.  Sodium 137, Chloride 110 on 7/30. - Monitor for s/s of hypernatremia and hyperchloremia - Daily BMP - D51/2NS @ 75 mL/hr  Hypocalcemia Patient with low ionized calcium. Can  replete with calcium gluconate if needed, although will opt to just watch for now. Calcium 7.0, corrected calcium 9.3. Serum calcium not low when corrected for poor albumin level. Consider ical if symptomatic. - daily bmp   Protein Calorie Malnutrition  Patient with albumin of 2.0 on admission and last albumin measured on 7/25 of 1.3, marked temporal wasting, weight of 100lbs. Patient very thin and very frail looking on exam. Will need protein supplementation. IV fluids for now given NPO status. Passed swallow study on 7/29. Getting dysphagia diet with honey thick liquids. - Nutrition consult - fluids as above  Uricemia Patient with bun of 38 on admission, now 12. Most likely due to muscle wasting prior to admission - will continue to watch - daily bmp - fluids as above  Elevated Transaminases Elevated AST up to 79 on admission, now 61. Patient with unknown history of alcohol use or any liver disorder.  -Will continue to trend   PMH is unknown  FEN/GI: dysphagia I, honey thick liquids Prophylaxis: pantoprazole, SCDs   Disposition: SNF, will work with CSW on arranging this  Subjective:  Doing well this morning. No complaints. Very hungry. AOx3. Oriented to self, place, and situation. Slow to respond.  Objective: Temp:  [98.4 F (36.9 C)-98.6 F (37 C)] 98.4 F (36.9 C) (07/31 2047) Pulse Rate:  [80-105] 105 (07/31 2047) Resp:  [19-20] 19 (07/31 2047) BP: (142-163)/(81-84) 142/81 (07/31 2047) SpO2:  [98 %-100 %] 98 % (07/31 2047) Weight:  [146 lb (66.2 kg)] 146 lb (66.2 kg) (07/31 0151) Physical Exam: General: thin, frail, chronically-ill appearing man with temporal wasting Cardiovascular: RRR, no MRG, palpable radial pulse BUE. Respiratory: coarse rhonchi bilaterally, chest tube site dressed with kerlix and tape. Satting 98% on room air. Abdomen: +bowel sounds, nontender Extremities: thin, no lesions noted  Laboratory:  Recent Labs Lab 01/16/17 0719  01/17/17 0443 01/18/17 0346 01/18/17 0729  WBC 4.2 4.6 4.5  --   HGB 11.0* 10.2* 8.8* 9.8*  HCT 34.2* 33.3* 27.7* 31.9*  PLT 71* 86* 96*  --     Recent Labs Lab 01/12/17 0305  01/16/17 0552 01/17/17 0443 01/18/17 0346  NA 149*  < > 142 141 137  K 3.6  < > 4.7 4.2 4.1  CL 126*  < > 113* 112* 110  CO2 15*  < > 22 23 24   BUN 42*  < > 14 12 10   CREATININE 1.35*  < > 0.90 0.89 0.78  CALCIUM 7.0*  < > 7.1* 7.2* 7.0*  PROT 4.7*  --   --   --   --   BILITOT 2.0*  --   --   --   --   ALKPHOS 51  --   --   --   --   ALT 39  --   --   --   --   AST 61*  --   --   --   --   GLUCOSE 130*  < > 194* 231* 163*  < > = values in this interval not displayed.   Imaging/Diagnostic Tests: Dg Chest 2 View  Result Date: 01/18/2017 CLINICAL DATA:  Followup small right hydropneumothorax. EXAM: CHEST  2 VIEW COMPARISON:  Earlier today and yesterday. FINDINGS: Small to moderate-sized right pleural effusion without significant change. No  residual pneumothorax seen. Mild right basilar atelectasis. Dense left lower lobe opacity without significant change. Grossly normal sized heart. Unremarkable bones. IMPRESSION: 1. Resolved right pneumothorax with a residual small to moderate-sized pleural effusion. 2. Mild right basilar atelectasis. 3. Stable left lower lobe atelectasis or pneumonia. Electronically Signed   By: Beckie Salts M.D.   On: 01/18/2017 18:26   Ct Chest W Contrast  Result Date: 01/17/2017 CLINICAL DATA:  Admitted with aspiration pneumonia and septic shock. Empyema. Concern for right upper lobe mass on admission chest CT. EXAM: CT CHEST WITH CONTRAST TECHNIQUE: Multidetector CT imaging of the chest was performed during intravenous contrast administration. CONTRAST:  75mL ISOVUE-300 IOPAMIDOL (ISOVUE-300) INJECTION 61% COMPARISON:  01/12/2017 FINDINGS: Cardiovascular: No cardiomegaly. No pericardial effusion. There is thinning of the lateral and inferior wall left ventricle consistent with remote  infarct. Atherosclerotic calcification of the coronaries is extensive. No acute vascular finding. Mediastinum/Nodes: Asymmetric enlargement of right hilar lymph nodes no measuring up to 14 mm. Sub- carinaladenopathy is also present. These are nonspecific in this setting. Lungs/Pleura: Significantly decreased empyemawith resolved mediastinal shift. Small pockets of fluid are seen along the lateral mid chest and in the mid fissures. Residual fissural fluid measures up to 3 cm in length. Lateral chest wall fluid measures up to 14 mm in thickness. Patchy ground-glass and consolidative opacities in the right lung, greatest in the lower lobe. No visible cavitary focus. Small pneumothorax, estimated at 10%. Right basilar chest tube is partly covered, unremarkable where seen. There is a small to moderate layering left pleural effusion without definitive loculation. No pneumonia seen on the left. Centrilobular emphysema. Initially there is concern for central airway lesion; obstructing lesion is not seen today. Upper Abdomen: Partly seen ascites. There is also retroperitoneal edema which creates an indistinct appearance of the gastric wall. Musculoskeletal: No acute or aggressive finding IMPRESSION: 1. Significantly improved right-sided empyema. Small pockets of residual fluid are seen along the lateral chest wall and fissures. Pneumothorax is small and stable from chest x-ray earlier today. 2. Pneumonia and atelectasis on the right, greatest in the lower lobe. 3. Diminished concern for obstructing airway lesion when compared to admission CT, when there was bronchial distortion due to the pleural effusion. Recommend three-month follow-up CT in this smoker with nonspecific adenopathy. 4. Small upper abdominal ascites that is newly seen. 5. Aortic Atherosclerosis (ICD10-I70.0) and Emphysema (ICD10-J43.9). 6. 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 Electronically Signed   By: Marnee Spring  M.D.   On: 01/17/2017 07:22   Ct Maxillofacial W Contrast  Result Date: 01/18/2017 CLINICAL DATA:  Bacteremia.  Dental abscess? EXAM: CT MAXILLOFACIAL WITH CONTRAST TECHNIQUE: Multidetector CT imaging of the maxillofacial structures was performed with intravenous contrast. Multiplanar CT image reconstructions were also generated. CONTRAST:  75mL ISOVUE-300 IOPAMIDOL (ISOVUE-300) INJECTION 61% COMPARISON:  Brain MRI 01/12/2017 FINDINGS: Osseous: --Complex facial fracture types: No [place], zygomaticomaxillary complex or nasoorbitoethmoidal fracture. --Simple fracture types: None. --Mandible, hard palate and teeth: There is a large periapical lucency surrounding the patient's single remaining tooth, which is either tooth 10 or 11. Orbits: The globes appear intact. Normal appearance of the intra- and extraconal fat. Symmetric extraocular muscles. Sinuses: Moderate maxillary sinus mucosal thickening with frothy secretions in the left maxillary sinus. Soft tissues: Normal visualized extracranial soft tissues. Limited intracranial: Right temporal lobe encephalomalacia. Ventriculomegaly. IMPRESSION: Large periapical lucency at the root of the patient's single remaining tooth. No associated drainable collection. Electronically Signed   By: Chrisandra Netters.D.  On: 01/18/2017 21:48   Dg Chest Port 1 View  Result Date: 01/18/2017 CLINICAL DATA:  Empyema. EXAM: PORTABLE CHEST 1 VIEW COMPARISON:  Chest x-ray from yesterday. FINDINGS: Small right hydropneumothorax, unchanged. Probable small left pleural effusion with associated left basilar atelectasis. The cardiomediastinal silhouette is normal in size. Heterogeneous opacity at the right base is unchanged. IMPRESSION: 1. Unchanged small right hydropneumothorax. 2. Unchanged heterogeneous opacity in the right lower lung, possibly atelectasis, pneumonia, or re-expansion pulmonary edema. Electronically Signed   By: Obie Dredge M.D.   On: 01/18/2017 07:29   Dg Chest  Port 1 View  Result Date: 01/17/2017 CLINICAL DATA:  Pneumothorax status post right chest tube removal EXAM: PORTABLE CHEST 1 VIEW COMPARISON:  01/17/2017 at 0155 hours FINDINGS: Small right pneumothorax, unchanged. Removal of indwelling right basilar chest tube. Left lung is clear. Defibrillator pads overlying the left hemithorax. The heart is normal in size. IMPRESSION: Status post removal of indwelling right basilar chest tube. Small right pneumothorax, unchanged. Electronically Signed   By: Charline Bills M.D.   On: 01/17/2017 22:25   Dg Chest Port 1 View  Result Date: 01/17/2017 CLINICAL DATA:  Oxygen desaturation EXAM: PORTABLE CHEST 1 VIEW COMPARISON:  CT chest 01/16/2017.  Chest 01/16/2017. FINDINGS: Shallow inspiration. Atelectasis or infiltration in the right lung base and in the left lung base. Small residual right pneumothorax with right chest tube in place. Cardiac enlargement without vascular congestion. Tortuous aorta. No significant changes since previous study, lying for differences in technique. IMPRESSION: Shallow inspiration with atelectasis or infiltration in the lung bases. Small residual right pneumothorax with right chest tube in place. Cardiac enlargement. Electronically Signed   By: Burman Nieves M.D.   On: 01/17/2017 02:23   Dg Chest Port 1 View  Result Date: 01/16/2017 CLINICAL DATA:  Empyema EXAM: PORTABLE CHEST 1 VIEW COMPARISON:  01/15/2017 FINDINGS: O right basilar chest tube remains in place. Stable small right apical and medial pneumothorax. Diffuse airspace disease throughout the right lung, slightly improved. Small right effusion. Minimal left base atelectasis or infiltrate, stable. IMPRESSION: No change since prior study. Electronically Signed   By: Charlett Nose M.D.   On: 01/16/2017 07:43   Dg Chest Port 1 View  Result Date: 01/15/2017 CLINICAL DATA:  Empyema, extubation EXAM: PORTABLE CHEST 1 VIEW COMPARISON:  01/14/2017 FINDINGS: Interval removal of  endotracheal tube, right central line and NG tube. Diffuse airspace disease throughout the right lung is stable. Small right apical pneumothorax, 5-10% slightly decreased since prior study. Right basilar chest tube remains in place. Mild cardiomegaly. No confluent opacity on the left. IMPRESSION: Small right apical pneumothorax, slightly decreased since prior study. Continued diffuse right lung airspace disease. Electronically Signed   By: Charlett Nose M.D.   On: 01/15/2017 08:00   Dg Abd Portable 1v  Result Date: 01/15/2017 CLINICAL DATA:  Feeding tube placement EXAM: PORTABLE ABDOMEN - 1 VIEW COMPARISON:  None. FINDINGS: Feeding tube tip is in the proximal stomach. Right basilar chest tube noted with right pneumothorax noted medially. IMPRESSION: Feeding tube tip in the proximal stomach. Electronically Signed   By: Charlett Nose M.D.   On: 01/15/2017 12:16   Dg Swallowing Func-speech Pathology  Result Date: 01/16/2017 Objective Swallowing Evaluation: Type of Study: MBS-Modified Barium Swallow Study Patient Details Name: Rickardo Brinegar MRN: [id number] Date of Birth: 1945/10/12 Today's Date: 01/16/2017 Time: SLP Start Time (ACUTE ONLY): 1040-SLP Stop Time (ACUTE ONLY): 1100 SLP Time Calculation (min) (ACUTE ONLY): 20 min Past Medical History: No past medical  history on file. Past Surgical History: No past surgical history on file. HPI: Mr. Fenech is a 71yo male with unknown PMH per EMR who was admitted to [facility] 7/23 for AMS, R facial droop, and generalized weakness. Last seen normal by nephew 7/22 PM. Per nephew, apparently A&Ox4 at baselineand able to someADLs but requires some assistance. CT head negative for stroke but with area of encephalomalacia/gliosis in R temporal lobe. CT chest with large loculated hydropneumothorax on R chest and nonvisualization of RUL bronchus. Intubated 7/24-7/27. CXR 7/28 showed diffuse right lung infiltrate. No Data Recorded Assessment / Plan / Recommendation CHL IP CLINICAL  IMPRESSIONS 01/16/2017 Clinical Impression Pt currently presenting with a moderate oropharyngeal dysphagia impacted by sensorimotor function post-intubation and cognitive status. Pt with premature spill to the level of the pyriform sinuses along with reduced hyolaryngeal excursion resulting in reduced airway protection and silent aspiration of thin liquids during the swallow. Pt with very weak cough and unable to completely clear laryngeal residuals. Pt also with mild-severe residuals in the vallecula and pyriform sinuses post-swallow across consistencies which were inconsistent. As the study progressed, the pt appeared to clear residuals more effectively and was cued to "swallow hard". Cognitively unable to complete any other maneuvers or strategies. Pt tolerated nectar-thick liquids by tsp without penetration or aspiration noted; pt unable to control sip size and straw sips resulted in more significant pharyngeal residuals. Recommend initiating dysphagia 1 diet, nectar-thick liquids by teaspoon only, meds crushed in puree, full supervision to assist with feeding/ small bites and sips/ cue pt to swallow hard with each bolus. Will continue to follow for diet tolerance/ consider advancement as cognitive status improves. SLP Visit Diagnosis Dysphagia, unspecified (R13.10) Attention and concentration deficit following -- Frontal lobe and executive function deficit following -- Impact on safety and function Moderate aspiration risk   CHL IP TREATMENT RECOMMENDATION 01/16/2017 Treatment Recommendations Therapy as outlined in treatment plan below   Prognosis 01/16/2017 Prognosis for Safe Diet Advancement Good Barriers to Reach Goals Cognitive deficits Barriers/Prognosis Comment -- CHL IP DIET RECOMMENDATION 01/16/2017 SLP Diet Recommendations Dysphagia 1 (Puree) solids;Nectar thick liquid Liquid Administration via Spoon Medication Administration Crushed with puree Compensations Minimize environmental distractions;Slow  rate;Small sips/bites;Effortful swallow Postural Changes Seated upright at 90 degrees   CHL IP OTHER RECOMMENDATIONS 01/16/2017 Recommended Consults -- Oral Care Recommendations Oral care BID Other Recommendations Order thickener from pharmacy;Remove water pitcher;Clarify dietary restrictions   CHL IP FOLLOW UP RECOMMENDATIONS 01/16/2017 Follow up Recommendations 24 hour supervision/assistance   CHL IP FREQUENCY AND DURATION 01/16/2017 Speech Therapy Frequency (ACUTE ONLY) min 3x week Treatment Duration 2 weeks      CHL IP ORAL PHASE 01/16/2017 Oral Phase Impaired Oral - Pudding Teaspoon -- Oral - Pudding Cup -- Oral - Honey Teaspoon -- Oral - Honey Cup -- Oral - Nectar Teaspoon Delayed oral transit;Premature spillage Oral - Nectar Cup -- Oral - Nectar Straw Lingual/palatal residue;Piecemeal swallowing;Delayed oral transit;Premature spillage Oral - Thin Teaspoon Delayed oral transit;Premature spillage Oral - Thin Cup -- Oral - Thin Straw Delayed oral transit;Premature spillage Oral - Puree Delayed oral transit;Premature spillage Oral - Mech Soft -- Oral - Regular -- Oral - Multi-Consistency -- Oral - Pill -- Oral Phase - Comment --  CHL IP PHARYNGEAL PHASE 01/16/2017 Pharyngeal Phase Impaired Pharyngeal- Pudding Teaspoon -- Pharyngeal -- Pharyngeal- Pudding Cup -- Pharyngeal -- Pharyngeal- Honey Teaspoon -- Pharyngeal -- Pharyngeal- Honey Cup -- Pharyngeal -- Pharyngeal- Nectar Teaspoon Delayed swallow initiation-vallecula;Delayed swallow initiation-pyriform sinuses;Reduced epiglottic inversion;Reduced anterior laryngeal mobility;Reduced laryngeal elevation;Reduced  tongue base retraction;Pharyngeal residue - valleculae Pharyngeal -- Pharyngeal- Nectar Cup -- Pharyngeal -- Pharyngeal- Nectar Straw Delayed swallow initiation-pyriform sinuses;Reduced epiglottic inversion;Reduced anterior laryngeal mobility;Reduced laryngeal elevation;Reduced tongue base retraction;Pharyngeal residue - valleculae;Pharyngeal residue -  pyriform Pharyngeal -- Pharyngeal- Thin Teaspoon Delayed swallow initiation-pyriform sinuses;Reduced epiglottic inversion;Reduced anterior laryngeal mobility;Reduced laryngeal elevation;Reduced tongue base retraction;Pharyngeal residue - valleculae Pharyngeal -- Pharyngeal- Thin Cup -- Pharyngeal -- Pharyngeal- Thin Straw Delayed swallow initiation-pyriform sinuses;Reduced epiglottic inversion;Reduced anterior laryngeal mobility;Reduced laryngeal elevation;Reduced airway/laryngeal closure;Reduced tongue base retraction;Penetration/Aspiration during swallow;Pharyngeal residue - valleculae;Pharyngeal residue - pyriform Pharyngeal Material enters airway, passes BELOW cords without attempt by patient to eject out (silent aspiration) Pharyngeal- Puree Delayed swallow initiation-vallecula;Delayed swallow initiation-pyriform sinuses;Reduced epiglottic inversion;Reduced anterior laryngeal mobility;Reduced laryngeal elevation;Reduced tongue base retraction;Pharyngeal residue - valleculae;Pharyngeal residue - pyriform Pharyngeal -- Pharyngeal- Mechanical Soft -- Pharyngeal -- Pharyngeal- Regular -- Pharyngeal -- Pharyngeal- Multi-consistency -- Pharyngeal -- Pharyngeal- Pill -- Pharyngeal -- Pharyngeal Comment --  CHL IP CERVICAL ESOPHAGEAL PHASE 01/16/2017 Cervical Esophageal Phase Impaired Pudding Teaspoon -- Pudding Cup -- Honey Teaspoon -- Honey Cup -- Nectar Teaspoon WFL Nectar Cup -- Nectar Straw Reduced cricopharyngeal relaxation Thin Teaspoon WFL Thin Cup -- Thin Straw Reduced cricopharyngeal relaxation Puree Reduced cricopharyngeal relaxation Mechanical Soft -- Regular -- Multi-consistency -- Pill -- Cervical Esophageal Comment -- No flowsheet data found. Amy Cecille AverK [place], MA, CCC-SLP 01/16/2017, 11:26 AM [phone]              Myrene BuddyFletcher, Cormick Moss, MD 01/18/2017, 11:41 PM PGY-1, [facility] FPTS Intern pager: [phone], text pages welcome

## 2017-01-18 NOTE — Progress Notes (Signed)
PULMONARY / CRITICAL CARE MEDICINE   Name: Jay Stephens MRN: [id number] DOB: 1945-08-19    ADMISSION DATE:12/20/2016 CONSULTATION DATE:01/11/2017  REFERRING EV:[id number]  CHIEF COMPLAINT:AMS  HISTORY OF PRESENT ILLNESS: 71 year old male with aspiration PNA, Rt empyema, VDRF, septic shock. Had CT placed for empyema and treated with ABX. Now off and CT out 7/30.  SUBJECTIVE: Wants to eat.   VITAL SIGNS: BP (!) 163/84 (BP Location: Left Arm)   Pulse 80   Temp 98.6 F (37 C)   Resp 20   Ht _0  (1.753 m)   Wt 66.2 kg (146 lb) Comment: only blanket, pillow, and sheet on bed with pt  SpO2 100%   BMI 21.56 kg/m   INTAKE / OUTPUT: I/O last 3 completed shifts: In: 90 [P.O.:1226] Out: 72 [Urine:850; Stool:120; Chest Tube:200]  PHYSICAL EXAMINATION: General: Cachectic elderly male in NAD Neuro: Alert, responds appropriately.  HEENT:  [place]/AT, PERRL, no JVD Cardiovascular:  RRR, no MRG Lungs:  Clear bilateral  Abdomen:  Soft, non-distended, non-tender Musculoskeletal:  No acute deformity Skin:  Grossly intact  LABS:  BMET  Recent Labs Lab 01/16/17 0552 01/17/17 0443 01/18/17 0346  NA 142 141 137  K 4.7 4.2 4.1  CL 113* 112* 110  CO2 _1 BUN _2 CREATININE 0.90 0.89 0.78  GLUCOSE 194* 231* 163*    Electrolytes  Recent Labs Lab 01/11/17 1648 01/12/17 0305 01/12/17 1626  01/16/17 0552 01/17/17 0443 01/18/17 0346  CALCIUM  --  7.0* 6.7*  < > 7.1* 7.2* 7.0*  MG 1.9 1.6* 2.5*  --   --   --   --   PHOS 3.7 3.8 2.3*  --   --   --   --   < > = values in this interval not displayed.  CBC  Recent Labs Lab 01/16/17 0719 01/17/17 0443 01/18/17 0346  WBC 4.2 4.6 4.5  HGB 11.0* 10.2* 8.8*  HCT 34.2* 33.3* 27.7*  PLT 71* 86* 96*    Coag's No results for input(s): APTT, INR in the last 168 hours.  Sepsis Markers  Recent Labs Lab 01/11/17 1220 01/11/17 1357  LATICACIDVEN 4.5* 2.6*    ABG  Recent Labs Lab 01/11/17 1124  01/12/17 1647 01/17/17 0200  PHART 7.479* 7.429 7.446  PCO2ART 24.7* 28.1* 32.9  PO2ART 227.0* 111* 403*    Liver Enzymes  Recent Labs Lab 01/11/17 1002 01/12/17 0305  AST 80* 61*  ALT 53 39  ALKPHOS 63 51  BILITOT 1.9* 2.0*  ALBUMIN 1.7* 1.3*    Cardiac Enzymes No results for input(s): TROPONINI, PROBNP in the last 168 hours.  Glucose  Recent Labs Lab 01/17/17 1215 01/17/17 1623 01/17/17 2031 01/18/17 0029 01/18/17 0404 01/18/17 0825  GLUCAP 232* 248* 230* 187* 188* 157*    Imaging Dg Chest Port 1 View  Result Date: 01/18/2017 CLINICAL DATA:  Empyema. EXAM: PORTABLE CHEST 1 VIEW COMPARISON:  Chest x-ray from yesterday. FINDINGS: Small right hydropneumothorax, unchanged. Probable small left pleural effusion with associated left basilar atelectasis. The cardiomediastinal silhouette is normal in size. Heterogeneous opacity at the right base is unchanged. IMPRESSION: 1. Unchanged small right hydropneumothorax. 2. Unchanged heterogeneous opacity in the right lower lung, possibly atelectasis, pneumonia, or re-expansion pulmonary edema. Electronically Signed   By: Titus Dubin M.D.   On: 01/18/2017 07:29   Dg Chest Port 1 View  Result Date: 01/17/2017 CLINICAL DATA:  Pneumothorax status post right chest tube removal EXAM: PORTABLE CHEST 1 VIEW COMPARISON:  01/17/2017 at 0155 hours FINDINGS: Small right pneumothorax, unchanged. Removal of indwelling right basilar chest tube. Left lung is clear. Defibrillator pads overlying the left hemithorax. The heart is normal in size. IMPRESSION: Status post removal of indwelling right basilar chest tube. Small right pneumothorax, unchanged. Electronically Signed   By: Julian Hy M.D.   On: 01/17/2017 22:25     STUDIES: 7/23 CT head>>no acute intracranial abnormalities. chronic area of encephalomalacia/gliosis in R temporal lobe, likely postoperative. Periapical abscess around L maxillary tooth. 7/23 CT chest>>large loculated  R hydropneumothorax with atelectatic R lung and leftward mediastinal shift. Nonvisualization of RUL bronchus, suggesting central occluding lesion 7/25 MRI brain>1. No acute intracranial abnormality identified. Right temporal lobe hemosiderin stained chronic encephalomalacia. Moderate brain parenchymal volume loss. Moderate diffuse paranasal sinus disease. Prominent extra-axial CSF spaces, probably chronic hygroma, no significant mass effect on the brain. 7/25> ECHO: 55-60%, Trileaflet; moderately thickened, moderately calcified leaflets, G2DD and akinesis of the inferolateral myocardium  CULTURES: 7/24 BAL rightCx>>>multiple organisms 7/24 R pleural fluid Cx>>> Viridans Streptococcus 7/24 Blood cultures x2>>>  ANTIBIOTICS: Vanc 7/24>>>7/25 Zosyn 7/24>>>  SIGNIFICANT EVENTS: 7/23 admit to FMTS 7/24 rapid response, intubated, bronchoscopy, R chest tube placed with ~3L milky yellow pus drained  LINES/TUBES: 7/24 ETT >> 7/27 7/24 chest tube rt >>  ASSESSMENT / PLAN:  Acute hypoxic respiratory failure. Aspiration pneumonia. Viridans Streptococcus Rt empyema. - Chest tube out 7/30, some residual PTX on CXR this AM - Antibiotics per primary team - total 14 days planned - Supplemental O2, defer further chest tube placement.  - Palliative following  PCCM will sign off.   Georgann Housekeeper, AGACNP-BC [facility] Pulmonology/Critical Care Pager [phone] or [phone]  01/18/2017 8:54 AM

## 2017-01-18 NOTE — NC FL2 (Signed)
[place] MEDICAID FL2 LEVEL OF CARE SCREENING TOOL     IDENTIFICATION  Patient Name: Jay Stephens Birthdate: 04/05/1946 Sex: male Admission Date (Current Location): 01/17/2017  [facility] [place]   Facility and Address:  [facility] [place], [address], [place], [place] [address]      Provider Number: [id number]  Attending Physician Name and Address:  Moses MannersHensel, William A, MD  Relative Name and Phone Number:  [name], nephew, [phone]    Current Level of Care: Hospital Recommended Level of Care: Skilled Nursing Facility Prior Approval Number:    Date Approved/Denied:   PASRR Number: [id number] [id number]  (In [place] as Jay LennertJesse Stephens, [birth year])  Discharge Plan: SNF    Current Diagnoses: Patient Active Problem List   Diagnosis Date Noted  . Abscessed tooth   . Bacteremia   . Chest tube in place   . Protein-calorie malnutrition (HCC) 01/11/2017  . Acute respiratory failure (HCC)   . Empyema (HCC)   . Cerebrovascular accident (CVA) due to thrombosis of precerebral artery (HCC)   . Weakness   . Altered mental status 12/20/2016    Orientation RESPIRATION BLADDER Height & Weight     Self  Normal Incontinent, External catheter Weight: 66.2 kg (146 lb) (only blanket, pillow, and sheet on bed with pt) Height:  5\' 9"  (175.3 cm)  BEHAVIORAL SYMPTOMS/MOOD NEUROLOGICAL BOWEL NUTRITION STATUS      Incontinent (Rectal tube) Diet (Please see DC Summary)  AMBULATORY STATUS COMMUNICATION OF NEEDS Skin   Extensive Assist Verbally Normal                       Personal Care Assistance Level of Assistance  Bathing, Feeding, Dressing Bathing Assistance: Maximum assistance Feeding assistance: Limited assistance Dressing Assistance: Maximum assistance     Functional Limitations Info             SPECIAL CARE FACTORS FREQUENCY  PT (By licensed PT), OT (By licensed OT)     PT Frequency: 5x/week OT Frequency: 3x/week             Contractures      Additional Factors Info  Code Status, Allergies Code Status Info: Full Allergies Info: NKA           Current Medications (01/18/2017):  This is the current hospital active medication list Current Facility-Administered Medications  Medication Dose Route Frequency Provider Last Rate Last Dose  . ceFAZolin (ANCEF) IVPB 1 g/50 mL premix  1 g Intravenous Q8H Sampson SiMancheril, Benjamin G, RPH   Stopped at 01/18/17 [id number]  . dextrose 5 % and 0.45 % NaCl with KCl 20 mEq/L infusion   Intravenous Continuous Coralyn HellingSood, Vineet, MD 75 mL/hr at 01/18/17 1003 75 mL/hr at 01/18/17 1003  . folic acid (FOLVITE) tablet 1 mg  1 mg Oral Daily Hensel, Santiago BumpersWilliam A, MD   1 mg at 01/18/17 1005  . heparin injection 5,000 Units  5,000 Units Subcutaneous Q8H Renne MuscaWarden, Daniel L, MD   5,000 Units at 01/18/17 [id number]  . labetalol (NORMODYNE,TRANDATE) injection 10 mg  10 mg Intravenous Q2H PRN Coralyn HellingSood, Vineet, MD      . LORazepam (ATIVAN) injection 1-4 mg  1-4 mg Intravenous Q4H PRN Renne MuscaWarden, Daniel L, MD      . MEDLINE mouth rinse  15 mL Mouth Rinse BID Coralyn HellingSood, Vineet, MD   15 mL at 01/18/17 1000  . multivitamin with minerals tablet 1 tablet  1 tablet Oral Daily Hensel, Santiago BumpersWilliam A, MD  1 tablet at 01/18/17 1005  . RESOURCE THICKENUP CLEAR   Oral PRN Moses MannersHensel, William A, MD      . sodium chloride flush (NS) 0.9 % injection 3 mL  3 mL Intravenous Q12H Beaulah DinningGambino, Christina M, MD   3 mL at 01/18/17 1005  . thiamine (VITAMIN B-1) tablet 100 mg  100 mg Oral Daily Moses MannersHensel, William A, MD   100 mg at 01/18/17 1005     Discharge Medications: Please see discharge summary for a list of discharge medications.  Relevant Imaging Results:  Relevant Lab Results:   Additional Information SSN: [id number] [id number]       Loyd Salvador S LeandoRayyan, [place]CSWA

## 2017-01-18 NOTE — Progress Notes (Signed)
Patient unable to hold self up to do orthopanogram per radiology. Provider notified.

## 2017-01-18 NOTE — Progress Notes (Signed)
Daily Progress Note   Patient Name: Jay Stephens       Date: 01/18/2017 DOB: 06/20/1946  Age: 71 y.o. MRN#: [id number] Attending Physician: Moses Manners, MD Primary Care Physician: System, Pcp Not In Admit Date: 01/17/2017  Reason for Consultation/Follow-up: Establishing goals of care  Subjective: Patient denies any pain, shortness of breath or constipation. He was finishing eating breakfast at the time of the visit. He had eaten a large portion of his plate. Coughing occurred after bites of food. 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 (this is inaccurate - he lives with his nephew) The patient can be very difficult to understand. He is asking for a cigarette because he has smoked all of his life.    Assessment:  He has severe malnutrition with an albumin of 2, altered mental status (at baseline?) and small right hydropneumothorax which is unchanged, unchanged heterogeneous opacity in R lower lung potentially atelectasis, pneumonia or re-expansion pulmonary edema according to recent CXR.  PT eval states he is unable to tolerate out of bed to chair - unable to maintain upright posture.  He is max assist for all ADLs.  Chest tube removed overnight.    Patient Profile/HPI: 71 y.o. male  With an unknown past medical history who was admitted on 01/03/2017 with altered mental status and weakness. Ct shows a large loculated hydropneumothorax and hx of craniotomy and negative for stroke. He was intubated on 7/24, extubated on 7/27 and placed on a non-rebreather this morning. He was found to have empyema - cultures show strep viridans.  Chest tube was placed on 7/24.   He denies any pain or difficulty breathing at this time.  Pleasantly confused.  Length of Stay: 8  Current  Medications: Scheduled Meds:  . folic acid  1 mg Oral Daily  . heparin subcutaneous  5,000 Units Subcutaneous Q8H  . mouth rinse  15 mL Mouth Rinse BID  . multivitamin with minerals  1 tablet Oral Daily  . sodium chloride flush  3 mL Intravenous Q12H  . thiamine  100 mg Oral Daily    Continuous Infusions: .  ceFAZolin (ANCEF) IV Stopped (01/18/17 4098)  . dextrose 5 % and 0.45 % NaCl with KCl 20 mEq/L 75 mL/hr (01/18/17 1003)    PRN Meds: labetalol, LORazepam, RESOURCE  THICKENUP CLEAR  Physical Exam    Patient is in bed slowly eating breakfast. Somewhat lethargic CV: regular rate and rhythm.  Pulm: Rhonchi bilaterally, weak coughing after consuming food  Abd: soft, active bowel sounds  Neuro: decreased grip strength on left side. Able to follow commands with slower response time.   Vital Signs: BP (!) 163/84 (BP Location: Left Arm)   Pulse 80   Temp 98.6 F (37 C)   Resp 20   Ht 5\' 9"  (1.753 m)   Wt 66.2 kg (146 lb) Comment: only blanket, pillow, and sheet on bed with pt  SpO2 100%   BMI 21.56 kg/m  SpO2: SpO2: 100 % O2 Device: O2 Device: Nasal Cannula O2 Flow Rate: O2 Flow Rate (L/min): 2 L/min  Intake/output summary:  Intake/Output Summary (Last 24 hours) at 01/18/17 1019 Last data filed at 01/18/17 0900  Gross per 24 hour  Intake              718 ml  Output              820 ml  Net             -102 ml   LBM: Last BM Date: 01/17/17 Baseline Weight: Weight: 45.4 kg (100 lb) Most recent weight: Weight: 66.2 kg (146 lb) (only blanket, pillow, and sheet on bed with pt)       Palliative Assessment/Data: 30%    Flowsheet Rows     Most Recent Value  Intake Tab  Unit at Time of Referral  Cardiac/Telemetry Unit  Palliative Care Primary Diagnosis  Pulmonary  Date Notified  01/17/17  Palliative Care Type  New Palliative care  Reason for referral  Clarify Goals of Care  Date of Admission  01/03/2017  Date first seen by Palliative Care  01/17/17  # of days  Palliative referral response time  0 Day(s)  # of days IP prior to Palliative referral  7  Clinical Assessment  Palliative Performance Scale Score  30%  Psychosocial & Spiritual Assessment  Palliative Care Outcomes  Patient/Family meeting held?  Yes  Who was at the meeting?  patient      Patient Active Problem List   Diagnosis Date Noted  . Abscessed tooth   . Bacteremia   . Chest tube in place   . Protein-calorie malnutrition (HCC) 01/11/2017  . Acute respiratory failure (HCC)   . Empyema (HCC)   . Cerebrovascular accident (CVA) due to thrombosis of precerebral artery (HCC)   . Weakness   . Altered mental status 12/26/2016    Palliative Care Plan    Recommendations/Plan:  PMT meeting with nephew at 1 pm on 8/1 with intent to educate regarding patient's health issues and address code status.    Anticipate patient will need SNF on discharge  Goals of Care and Additional Recommendations:  Limitations on Scope of Treatment: Full Scope Treatment  Code Status:  Full code  Prognosis:   Months due to altered mental status, infection, and respiratory distress, bed bound status.  Discharge Planning:  Skilled Nursing Facility for rehab with Palliative care service follow-up    Thank you for allowing the Palliative Medicine Team to assist in the care of this patient.  Total time spent:  25 min.     Greater than 50%  of this time was spent counseling and coordinating care related to the above assessment and plan.  Norvel RichardsMarianne Kindred Reidinger, PA-C Palliative Medicine  Please contact Palliative MedicineTeam phone at [phone] for questions  and concerns between 7 am - 7 pm.   Please see AMION for individual provider pager numbers.

## 2017-01-18 NOTE — Progress Notes (Signed)
Pt's b/p elevated this am. Morning RN made aware. Will recheck

## 2017-01-18 NOTE — Progress Notes (Signed)
Patient's lunch time CBG was 181, provider notified .

## 2017-01-19 ENCOUNTER — Inpatient Hospital Stay (HOSPITAL_COMMUNITY): Payer: Medicare Other | Admitting: Certified Registered Nurse Anesthetist

## 2017-01-19 ENCOUNTER — Encounter (HOSPITAL_COMMUNITY): Admission: EM | Disposition: E | Payer: Self-pay | Source: Home / Self Care | Attending: Family Medicine

## 2017-01-19 ENCOUNTER — Inpatient Hospital Stay (HOSPITAL_COMMUNITY): Payer: Medicare Other

## 2017-01-19 ENCOUNTER — Encounter (HOSPITAL_COMMUNITY): Payer: Self-pay | Admitting: Surgery

## 2017-01-19 DIAGNOSIS — Z4682 Encounter for fitting and adjustment of non-vascular catheter: Secondary | ICD-10-CM

## 2017-01-19 DIAGNOSIS — J90 Pleural effusion, not elsewhere classified: Secondary | ICD-10-CM

## 2017-01-19 DIAGNOSIS — R7881 Bacteremia: Secondary | ICD-10-CM

## 2017-01-19 DIAGNOSIS — J948 Other specified pleural conditions: Secondary | ICD-10-CM

## 2017-01-19 DIAGNOSIS — IMO0002 Reserved for concepts with insufficient information to code with codable children: Secondary | ICD-10-CM

## 2017-01-19 DIAGNOSIS — Z515 Encounter for palliative care: Secondary | ICD-10-CM

## 2017-01-19 DIAGNOSIS — Z789 Other specified health status: Secondary | ICD-10-CM

## 2017-01-19 DIAGNOSIS — J939 Pneumothorax, unspecified: Secondary | ICD-10-CM | POA: Diagnosis present

## 2017-01-19 DIAGNOSIS — L0291 Cutaneous abscess, unspecified: Secondary | ICD-10-CM | POA: Diagnosis present

## 2017-01-19 DIAGNOSIS — R229 Localized swelling, mass and lump, unspecified: Secondary | ICD-10-CM

## 2017-01-19 DIAGNOSIS — R0902 Hypoxemia: Secondary | ICD-10-CM

## 2017-01-19 DIAGNOSIS — K047 Periapical abscess without sinus: Secondary | ICD-10-CM

## 2017-01-19 DIAGNOSIS — L89159 Pressure ulcer of sacral region, unspecified stage: Secondary | ICD-10-CM | POA: Diagnosis present

## 2017-01-19 DIAGNOSIS — Z4659 Encounter for fitting and adjustment of other gastrointestinal appliance and device: Secondary | ICD-10-CM | POA: Diagnosis not present

## 2017-01-19 HISTORY — PX: DENTAL RESTORATION/EXTRACTION WITH X-RAY: SHX5796

## 2017-01-19 HISTORY — PX: INCISION AND DRAINAGE ABSCESS: SHX5864

## 2017-01-19 LAB — GLUCOSE, CAPILLARY
GLUCOSE-CAPILLARY: 214 mg/dL — AB (ref 65–99)
GLUCOSE-CAPILLARY: 159 mg/dL — AB (ref 65–99)
GLUCOSE-CAPILLARY: 162 mg/dL — AB (ref 65–99)
GLUCOSE-CAPILLARY: 180 mg/dL — AB (ref 65–99)
Glucose-Capillary: 140 mg/dL — ABNORMAL HIGH (ref 65–99)
Glucose-Capillary: 186 mg/dL — ABNORMAL HIGH (ref 65–99)
Glucose-Capillary: 197 mg/dL — ABNORMAL HIGH (ref 65–99)

## 2017-01-19 LAB — CBC
HEMATOCRIT: 29.8 % — AB (ref 39.0–52.0)
Hemoglobin: 9.3 g/dL — ABNORMAL LOW (ref 13.0–17.0)
MCH: 25.8 pg — AB (ref 26.0–34.0)
MCHC: 31.2 g/dL (ref 30.0–36.0)
MCV: 82.5 fL (ref 78.0–100.0)
Platelets: 104 10*3/uL — ABNORMAL LOW (ref 150–400)
RBC: 3.61 MIL/uL — ABNORMAL LOW (ref 4.22–5.81)
RDW: 14.5 % (ref 11.5–15.5)
WBC: 5 10*3/uL (ref 4.0–10.5)

## 2017-01-19 LAB — BASIC METABOLIC PANEL
Anion gap: 6 (ref 5–15)
BUN: 9 mg/dL (ref 6–20)
CO2: 22 mmol/L (ref 22–32)
CREATININE: 0.75 mg/dL (ref 0.61–1.24)
Calcium: 7.1 mg/dL — ABNORMAL LOW (ref 8.9–10.3)
Chloride: 107 mmol/L (ref 101–111)
GFR calc non Af Amer: >60 mL/min (ref >60)
Glucose, Bld: 246 mg/dL — ABNORMAL HIGH (ref 65–99)
Potassium: 4.7 mmol/L (ref 3.5–5.1)
Sodium: 135 mmol/L (ref 135–145)

## 2017-01-19 LAB — SURGICAL PCR SCREEN
MRSA, PCR: NEGATIVE
Staphylococcus aureus: NEGATIVE

## 2017-01-19 SURGERY — DENTAL RESTORATION/EXTRACTION WITH X-RAY
Anesthesia: Monitor Anesthesia Care

## 2017-01-19 MED ORDER — LIDOCAINE-EPINEPHRINE 2 %-1:100000 IJ SOLN
INTRAMUSCULAR | Status: AC
  Filled 2017-01-19: qty 1.7

## 2017-01-19 MED ORDER — LIDOCAINE-EPINEPHRINE 2 %-1:100000 IJ SOLN
INTRAMUSCULAR | Status: DC | PRN
  Administered 2017-01-19 (×4): 1.7 mL via INTRADERMAL

## 2017-01-19 MED ORDER — FENTANYL CITRATE (PF) 250 MCG/5ML IJ SOLN
INTRAMUSCULAR | Status: AC
  Filled 2017-01-19: qty 5

## 2017-01-19 MED ORDER — ONDANSETRON HCL 4 MG/2ML IJ SOLN
INTRAMUSCULAR | Status: AC
  Filled 2017-01-19: qty 2

## 2017-01-19 MED ORDER — LIDOCAINE 2% (20 MG/ML) 5 ML SYRINGE
INTRAMUSCULAR | Status: AC
  Filled 2017-01-19: qty 5

## 2017-01-19 MED ORDER — SODIUM CHLORIDE 0.9 % IR SOLN
Status: DC | PRN
  Administered 2017-01-19: 1000 mL

## 2017-01-19 MED ORDER — PROPOFOL 500 MG/50ML IV EMUL
INTRAVENOUS | Status: DC | PRN
  Administered 2017-01-19: 50 ug/kg/min via INTRAVENOUS

## 2017-01-19 MED ORDER — LIDOCAINE-EPINEPHRINE 2 %-1:100000 IJ SOLN
INTRAMUSCULAR | Status: AC
  Filled 2017-01-19: qty 3.4

## 2017-01-19 MED ORDER — SODIUM CHLORIDE 0.9 % IV SOLN
INTRAVENOUS | Status: DC | PRN
  Administered 2017-01-19: 15:00:00 via INTRAVENOUS

## 2017-01-19 MED ORDER — CEFAZOLIN SODIUM 1 G IJ SOLR
INTRAMUSCULAR | Status: AC
  Filled 2017-01-19: qty 20

## 2017-01-19 MED ORDER — PROPOFOL 10 MG/ML IV BOLUS
INTRAVENOUS | Status: DC | PRN
  Administered 2017-01-19 (×2): 20 mg via INTRAVENOUS
  Administered 2017-01-19: 40 mg via INTRAVENOUS

## 2017-01-19 MED ORDER — ONDANSETRON HCL 4 MG/2ML IJ SOLN
INTRAMUSCULAR | Status: DC | PRN
  Administered 2017-01-19: 4 mg via INTRAVENOUS

## 2017-01-19 MED ORDER — PHENYLEPHRINE 40 MCG/ML (10ML) SYRINGE FOR IV PUSH (FOR BLOOD PRESSURE SUPPORT)
PREFILLED_SYRINGE | INTRAVENOUS | Status: DC | PRN
  Administered 2017-01-19: 120 ug via INTRAVENOUS

## 2017-01-19 MED ORDER — PROPOFOL 1000 MG/100ML IV EMUL
INTRAVENOUS | Status: AC
  Filled 2017-01-19: qty 100

## 2017-01-19 MED ORDER — FENTANYL CITRATE (PF) 250 MCG/5ML IJ SOLN
INTRAMUSCULAR | Status: DC | PRN
  Administered 2017-01-19: 25 ug via INTRAVENOUS

## 2017-01-19 MED ORDER — PROPOFOL 10 MG/ML IV BOLUS
INTRAVENOUS | Status: AC
  Filled 2017-01-19: qty 20

## 2017-01-19 MED ORDER — ROCURONIUM BROMIDE 10 MG/ML (PF) SYRINGE
PREFILLED_SYRINGE | INTRAVENOUS | Status: AC
  Filled 2017-01-19: qty 5

## 2017-01-19 SURGICAL SUPPLY — 54 items
BLADE 10 SAFETY STRL DISP (BLADE) ×3 IMPLANT
BLADE SURG 15 STRL LF DISP TIS (BLADE) ×1 IMPLANT
BLADE SURG 15 STRL SS (BLADE) ×2
BNDG CONFORM 2 STRL LF (GAUZE/BANDAGES/DRESSINGS) ×3 IMPLANT
BUR CROSS CUT FISSURE 1.6 (BURR) IMPLANT
BUR CROSS CUT FISSURE 1.6MM (BURR)
BUR EGG ELITE 4.0 (BURR) ×2 IMPLANT
BUR EGG ELITE 4.0MM (BURR) ×1
CANISTER SUCT 3000ML PPV (MISCELLANEOUS) ×3 IMPLANT
COVER SURGICAL LIGHT HANDLE (MISCELLANEOUS) ×6 IMPLANT
CRADLE DONUT ADULT HEAD (MISCELLANEOUS) IMPLANT
DRAIN PENROSE 1/4X12 LTX STRL (WOUND CARE) ×3 IMPLANT
DRSG PAD ABDOMINAL 8X10 ST (GAUZE/BANDAGES/DRESSINGS) IMPLANT
ELECT COATED BLADE 2.86 ST (ELECTRODE) IMPLANT
ELECT REM PT RETURN 9FT ADLT (ELECTROSURGICAL) ×3
ELECTRODE REM PT RTRN 9FT ADLT (ELECTROSURGICAL) ×1 IMPLANT
GAUZE PACKING FOLDED 2  STR (GAUZE/BANDAGES/DRESSINGS) ×2
GAUZE PACKING FOLDED 2 STR (GAUZE/BANDAGES/DRESSINGS) ×1 IMPLANT
GAUZE SPONGE 4X4 12PLY STRL (GAUZE/BANDAGES/DRESSINGS) IMPLANT
GAUZE SPONGE 4X4 16PLY XRAY LF (GAUZE/BANDAGES/DRESSINGS) ×3 IMPLANT
GLOVE BIO SURGEON STRL SZ 6.5 (GLOVE) ×2 IMPLANT
GLOVE BIO SURGEON STRL SZ7 (GLOVE) IMPLANT
GLOVE BIO SURGEON STRL SZ7.5 (GLOVE) ×3 IMPLANT
GLOVE BIO SURGEONS STRL SZ 6.5 (GLOVE) ×1
GLOVE BIOGEL PI IND STRL 6.5 (GLOVE) IMPLANT
GLOVE BIOGEL PI IND STRL 7.0 (GLOVE) ×1 IMPLANT
GLOVE BIOGEL PI INDICATOR 6.5 (GLOVE)
GLOVE BIOGEL PI INDICATOR 7.0 (GLOVE) ×2
GOWN STRL REUS W/ TWL LRG LVL3 (GOWN DISPOSABLE) ×1 IMPLANT
GOWN STRL REUS W/ TWL XL LVL3 (GOWN DISPOSABLE) ×1 IMPLANT
GOWN STRL REUS W/TWL LRG LVL3 (GOWN DISPOSABLE) ×2
GOWN STRL REUS W/TWL XL LVL3 (GOWN DISPOSABLE) ×2
KIT BASIN OR (CUSTOM PROCEDURE TRAY) ×3 IMPLANT
KIT ROOM TURNOVER OR (KITS) ×3 IMPLANT
MARKER SKIN DUAL TIP RULER LAB (MISCELLANEOUS) ×3 IMPLANT
NEEDLE 22X1 1/2 (OR ONLY) (NEEDLE) ×6 IMPLANT
NEEDLE HYPO 25GX1X1/2 BEV (NEEDLE) ×3 IMPLANT
NS IRRIG 1000ML POUR BTL (IV SOLUTION) ×3 IMPLANT
PACK SURGICAL SETUP 50X90 (CUSTOM PROCEDURE TRAY) ×3 IMPLANT
PAD ARMBOARD 7.5X6 YLW CONV (MISCELLANEOUS) ×6 IMPLANT
PENCIL BUTTON HOLSTER BLD 10FT (ELECTRODE) IMPLANT
SPONGE SURGIFOAM ABS GEL 12-7 (HEMOSTASIS) IMPLANT
SUT CHROMIC 3 0 PS 2 (SUTURE) IMPLANT
SUT ETHILON 2 0 FS 18 (SUTURE) ×3 IMPLANT
SWAB COLLECTION DEVICE MRSA (MISCELLANEOUS) ×3 IMPLANT
SWAB CULTURE ESWAB REG 1ML (MISCELLANEOUS) ×3 IMPLANT
SYR BULB IRRIGATION 50ML (SYRINGE) ×3 IMPLANT
SYR CONTROL 10ML LL (SYRINGE) ×3 IMPLANT
TOWEL GREEN STERILE (TOWEL DISPOSABLE) IMPLANT
TRAY ENT MC OR (CUSTOM PROCEDURE TRAY) ×3 IMPLANT
TUBE CONNECTING 12'X1/4 (SUCTIONS) ×1
TUBE CONNECTING 12X1/4 (SUCTIONS) ×2 IMPLANT
TUBING IRRIGATION (MISCELLANEOUS) ×3 IMPLANT
YANKAUER SUCT BULB TIP NO VENT (SUCTIONS) ×3 IMPLANT

## 2017-01-19 NOTE — Progress Notes (Signed)
Family Medicine Teaching Service Daily Progress Note Intern Pager: [phone]  Patient name: Jay Stephens Medical record number: [id number] Date of birth: 04/28/1946 Age: 71 y.o. Gender: male  Primary Care Provider: System, Pcp Not In Consultants: CCM, Neurology Code Status: FULL  Pt Overview and Major Events to Date:  Admitted on 01/12/2017 for AMS.  Intubated d/t respiratory distress and transferred to CCM on 7/24.  Extubated and transferred to FPTS on 01/14/17.  Assessment and Plan: [name] a 71 y.o.malewith unknown PMH presenting with apparent altered mental status, R facial droop, and generalized weakness, found to have empyema in right side of chest.  Altered Mental Status with R facial droop and weakness: Last seen normal by his nephew in pm of 7/22. Patient apparently AAOx4 and able to do all ADLs at baseline, although physical appearance does not suggest this. Patient with no known medical history.  CT scan (-) for acute stroke, but is positive for partial craniotomy with gliosis in right temporal lobe. UDSand alcohol(-).  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  Patient with very large empyema of right lung. Chest tube placed 7/24 with purulent output. Grew out strep viridans. Patient on antibiotics as follows: zosyn (7/24-7/28), vanc (7/24-7/25), ancef (7/28-)  Blood cultures negative x 4 days. Patient with chest tube in place with 7/27-7/28 output of 515mL/24hrs. Patient passed swallow study on 7/29, now getting dysphagia 1 with honey thick liquids. R sided facial droop is likely re-emergence of old symptoms due to stress from current illness per neurology.  Patient has improved a lot from admission; currently AOx3, GCS 15. - Vitals signs per step-down unit, pulse ox with vitals - daily BMP, CBC - continue give B1, folic acid - continue to monitor mental status - OT/PT/SLP following - dysphagia I, honey  thick liquids - Ativan 1-4 mg Q4H PRN for CIWA > 8 - continue ancef - continue D51/2NS with 20 mEq/L KCl @75  until tolerating PO well  Right sided Empyema: CT chest confirmed large loculated hydropneumothorax on right chest as well as nonvisualization right upper lobe bronchus concerning for obstruction.  Chest tube placed on 7/24 drained 3L pus from right chest empyema.  Culture of empyema fluid positive for numerous Strep viridans bacteria, which could be related to his tooth abscess.  Extubated successfully on 01/14/17.  Repeat CXR on 01/15/17 shows improved right apical pneumothorax.  WBC is 5.4.  Patient remains afebrile. CT with out on 7/27-7/28. Due to strep viridans and known tooth abscess, have consulted Oral and maxillofacial surgery. They have requested a panoramic view of face. Tentative surgery scheduled for 8/1. Chest tube to waterseal on 7/29. cxr stable from overnight. Chest tube pulled 7/30. Continues to sat well on room air. - continue ceftriaxone (day 8/14) - monitor respiratory status - [place] as needed to keep sats above 92%   Tooth abscess: Poor dentition with periapical abscess around a left maxillary tooth (likely tooth number 11) seen on head CT.  Could be source of strep viridans in empyema. Due to strep viridans and known tooth abscess, have consulted Oral and maxillofacial surgery. They have requested a panoramic view of face. Tentative surgery scheduled for 8/1. - Oral surgery following, appreciate their recs - CT maxilofacial ordered because patient could not support panthogram -believe surgery 8/1   Electrolyte abnormalities; Hypernatremia, hyperchloremia: Likely from dehydration, although electrolytes have been stable and remain elevated even with continued hydration.  Sodium 137, Chloride 110 on 7/30. -  Monitor for s/s of hypernatremiaand hyperchloremia - Daily BMP - D51/2NS w/ KCl 33mEq/L @ 75 mL/hr  Hypocalcemia Patient with low ionized calcium. Can  replete with calcium gluconate if needed, although will opt to just watch for now. Calcium 7.0, corrected calcium 9.3. Serum calcium not low when corrected for poor albumin level. Consider ical if symptomatic. - daily bmp   Protein Calorie Malnutrition  Patient with albumin of 2.0 on admission and last albumin measured on 7/25 of 1.3, marked temporal wasting, weight of 100lbs. Patient very thin and very frail looking on exam. Will need protein supplementation. IV fluids for now given NPO status. Passed swallow study on 7/29. Getting dysphagia diet with honey thick liquids. - Nutrition consult - fluids as above  Uricemia Patient with bun of 38 on admission, now 12. Most likely due to muscle wasting prior to admission - will continue to watch - daily bmp - fluids as above  Elevated Transaminases Elevated AST up to 79 on admission, now 61. Patient with unknown history of alcohol use or any liver disorder.  -Will continue to trend   PMH is unknown  FEN/GI: dysphagia I, honey thick liquids Prophylaxis: pantoprazole, SCDs   Disposition: SNF (patient consented), will work with CSW on arranging this; palliative to discuss code status  Subjective:  Patient AOx3, frail appearing but not complaining of pain.   Was complaining of being hungry but was told he was NPO for potential dental surgery.   He agreed he might need help taking care of himself and consented to SNF placement  Objective: Temp:  [98.4 F (36.9 C)-98.6 F (37 C)] 98.6 F (37 C) (08/01 0446) Pulse Rate:  [80-105] 94 (08/01 0446) Resp:  [19-22] 22 (08/01 0446) BP: (142-163)/(81-98) 162/98 (08/01 0446) SpO2:  [98 %-100 %] 100 % (08/01 0446) Weight:  [141 lb 5 oz (64.1 kg)] 141 lb 5 oz (64.1 kg) (08/01 0452) Physical Exam: General: frail appearing, alert/oriented, conversational Cardiovascular: RRR, no murmurs noted Respiratory: decreased breath sounds on RLL, dressing from recent CT drain was clean and  dry Abdomen: No tenderness to palpation, soft belly Extremities: no complaints of pain, decreased strength/slow movements bilaterally likely due to frailty  Laboratory:  Recent Labs Lab 01/17/17 0443 01/18/17 0346 01/18/17 0729 02/04/2017 0353  WBC 4.6 4.5  --  5.0  HGB 10.2* 8.8* 9.8* 9.3*  HCT 33.3* 27.7* 31.9* 29.8*  PLT 86* 96*  --  104*    Recent Labs Lab 01/17/17 0443 01/18/17 0346 02/17/2017 0353  NA 141 137 135  K 4.2 4.1 4.7  CL 112* 110 107  CO2 23 24 22   BUN 12 10 9   CREATININE 0.89 0.78 0.75  CALCIUM 7.2* 7.0* 7.1*  GLUCOSE 231* 163* 246*     Imaging/Diagnostic Tests: Dg Chest 2 View  Result Date: 12/23/2016 CLINICAL DATA:  Hydropneumothorax.  Shortness of breath. EXAM: CHEST  2 VIEW COMPARISON:  Chest CT performed immediately prior. FINDINGS: Right hydropneumothorax, with fluid level demonstrated on the lateral view. There is leftward mediastinal shift. Mediastinal contours are unchanged, right heart border obscured by right lung opacity. No left pneumothorax. No pulmonary edema. Fracture of left lateral fourth ribs is again seen. IMPRESSION: Large right hydropneumothorax with leftward mediastinal shift. Please reference concurrently performed chest CT for more detailed evaluation. Electronically Signed   By: Rubye Oaks M.D.   On: 01/02/2017 20:53   Ct Head Wo Contrast  Result Date: 01/18/2017 CLINICAL DATA:  71 year old male with history of altered mental status and  facial droop. EXAM: CT HEAD WITHOUT CONTRAST TECHNIQUE: Contiguous axial images were obtained from the base of the skull through the vertex without intravenous contrast. COMPARISON:  None. FINDINGS: Brain: Mild cerebral atrophy. Well-defined area of low attenuation and volume loss in the anterior aspect of the right temporal lobe, compatible with encephalomalacia/gliosis. Physiologic calcifications in the basal ganglia bilaterally. No evidence of acute infarction, hemorrhage, hydrocephalus,  extra-axial collection or mass lesion/mass effect. Vascular: No hyperdense vessel or unexpected calcification. Skull: Postoperative changes of prior right pterional craniectomy. Sinuses/Orbits: Multifocal mucoperiosteal thickening in the maxillary sinuses bilaterally. Other: Poor dentition with multiple missing teeth, and periapical abscess associated with one of the left maxillary teeth (likely tooth number 11). IMPRESSION: 1. No acute intracranial abnormalities. 2. Chronic area of encephalomalacia/gliosis in the right temporal lobe, likely postoperative. 3. Mild cerebral atrophy. 4. Changes of chronic sinusitis in the maxillary sinuses bilaterally. 5. Poor dentition with periapical abscess around a left maxillary tooth (likely tooth number 11). Electronically Signed   By: Trudie Reedaniel  Entrikin M.D.   On: 01/16/2017 17:03   Ct Chest Wo Contrast  Result Date: 12/27/2016 CLINICAL DATA:  Patient came in with AMS today. CXR showed opacified right lung EXAM: CT CHEST WITHOUT CONTRAST TECHNIQUE: Multidetector CT imaging of the chest was performed following the standard protocol without IV contrast. COMPARISON:  None. FINDINGS: Cardiovascular: Extensive coronary calcifications. Heart size normal. No significant pericardial effusion. Mediastinum/Nodes: Leftward mediastinal shift. Limited assessment for adenopathy due to adjacent opacities and lack of IV contrast. Lungs/Pleura: Large loculated right pleural effusion. The dominant component is posteriorly at the base, extending across midline. This extends along the posteromedial aspect of the pleural space to the apex with a large anterolateral component at the apex containing gas/fluid level. There is marked atelectasis of the right long with only trivial aerated segment in the lower lobe. Right upper lobe bronchus is occluded proximally. It is difficult to exclude a central mass. Left lung unremarkable.  No left effusion. Upper Abdomen: No acute findings. Musculoskeletal:  Minimal spondylitic changes in the visualized lower cervical spine. Old fracture deformity of the left fourth rib. No acute fracture or worrisome bone lesion. IMPRESSION: 1. Large right loculated hydropneumothorax, with atelectatic right lung and leftward mediastinal shift. 2. Nonvisualization of right upper lobe bronchus suggesting central occluding lesion. Follow-up recommended. 3. Coronary calcifications. Electronically Signed   By: Corlis Leak  Hassell M.D.   On: 12/30/2016 21:00   Dg Chest Port 1 View  Result Date: 01/11/2017 CLINICAL DATA:  Central line placement EXAM: PORTABLE CHEST 1 VIEW COMPARISON:  01/11/2017, 01/11/2017, 01/18/2017 FINDINGS: Endotracheal tube tip is just above the carina. Esophageal tube tip in the left upper quadrant. Placement of right-sided central venous catheter with tip overlying the SVC. Small moderate right apical pneumothorax re- demonstrated without significant interval change, 2.5 cm pleural-parenchymal separation at the apex. Right lower chest tube is in place with slight angulated appearance at the right lower lateral chest wall. Small residual right pleural effusion. Clear left lung. Stable cardiomediastinal silhouette. IMPRESSION: 1. Right-sided central venous catheter tip overlies the distal SVC 2. Endotracheal tube tip near the carina 3. No change in right pneumothorax or small right-sided pleural effusion. Slight angulated appearance of right lower chest drainage catheter at the lateral lower chest wall. Electronically Signed   By: Jasmine PangKim  Fujinaga M.D.   On: 01/11/2017 15:58   Dg Chest Port 1 View  Result Date: 01/11/2017 CLINICAL DATA:  Chest tube placement EXAM: PORTABLE CHEST 1 VIEW COMPARISON:  01/11/2017 . FINDINGS:  Interim placement right chest tube, its tip is over the right lower chest and incompletely imaged. Interval removal of right pleural effusion Right-sided pneumothorax again noted without significant change. Right lung is clear. IMPRESSION: 1. Interval  placement of right chest tube. Interval removal of right pleural effusion. Stable right pneumothorax. Right lung is clear. 2. Endotracheal tube and NG tube in stable position . Electronically Signed   By: Maisie Fushomas  Register   On: 01/11/2017 11:12   Portable Chest Xray  Result Date: 01/11/2017 CLINICAL DATA:  Assess positioning of the endotracheal tube. EXAM: PORTABLE CHEST 1 VIEW COMPARISON:  PA and lateral chest x-ray of January 10, 2017 FINDINGS: There is persistent near-total opacification of the right hemithorax with a faint pleural line suspected in the apex. There is mild shift of the mediastinum toward the left. The left lung is well-expanded and clear. The endotracheal tube tip lies approximately 3.2 cm above the carina. The esophagogastric tube tip in proximal port lie in the gastric cardia. IMPRESSION: Reasonable positioning of the endotracheal and esophagogastric tubes. Further worsening of opacification throughout the right hemithorax consistent with known hydropneumothorax. Electronically Signed   By: David  SwazilandJordan M.D.   On: 01/11/2017 10:25   Dg Chest Portable 1 View  Result Date: 01/17/2017 CLINICAL DATA:  ams, facial droop. non verbal EXAM: PORTABLE CHEST - 1 VIEW COMPARISON:  none FINDINGS: There is near complete opacification of the right hemithorax. Lucency towards the apex but no definite lung markings suggesting possible layering hydropneumothorax. There is mediastinal shift from right to left suggesting a degree of mass effect or tension. Heart size and mediastinal contours are within normal limits. Left lungs clear. Heart size difficult to assess due to adjacent opacity. Fracture the lateral aspect left third rib, age indeterminate. Fixation hardware projects over the mandible. IMPRESSION: 1. Opacification of the RIGHT hemithorax with some mass effect on the mediastinum. Can't exclude layering hydropneumothorax. Critical Value/emergent results were called by telephone at the time of  interpretation on 01/08/2017 at 4:54 pm to Dr. Benjiman CoreNATHAN PICKERING , who verbally acknowledged these results. 2. LEFT third rib fracture, age indeterminate. Electronically Signed   By: Corlis Leak  Hassell M.D.   On: 01/17/2017 16:55   Dg Abd Portable 1v  Result Date: 01/11/2017 CLINICAL DATA:  71 year old male with orogastric tube placement. Initial encounter. EXAM: PORTABLE ABDOMEN - 1 VIEW COMPARISON:  None. FINDINGS: Nasogastric tube curled within the left upper quadrant of the abdomen within the region of the gastric fundus. Abnormal bowel gas pattern with featureless loops of bowel (small bowel/ colon) left abdomen with paucity of bowel gas for right abdomen. Loculated right hydropneumothorax. The possibility of free intraperitoneal air cannot be assessed on a supine view. Possible vascular calcifications. IMPRESSION: Nasogastric tube curled within the left upper quadrant of the abdomen within the region of the gastric fundus. Abnormal bowel gas pattern with featureless loops of bowel (small bowel/ colon) left abdomen with paucity of bowel gas for right abdomen. Electronically Signed   By: Lacy DuverneySteven  Olson M.D.   On: 01/11/2017 10:27         Marthenia RollingBland, Suzzane Quilter, DO 02/18/2017, 6:53 AM PGY-1, [facility] FPTS Intern pager: [phone], text pages welcome

## 2017-01-19 NOTE — Op Note (Signed)
PATIENT:  Jay Stephens  71 y.o. male  PRE-OPERATIVE DIAGNOSIS:  DENTAL ABSCESS  POST-OPERATIVE DIAGNOSIS:  DENTAL ABSCESS  PROCEDURE:  1. Surgical removal of tooth root #11 2. Incision and drainage of anterior maxillary abscess (intraoral)  SURGEON:  Surgeon(s) and Role:    * Dresean Beckel, DMD - Primary  ANESTHESIA:   IV sedation and MAC  EBL:  Total I/O In: 843.8 [I.V.:793.8; IV Piggyback:50] Out: 3 [HOZYY:4]  Complications: none  Specimens: none  Operative findings: large periapical granuloma excised from site, no gross purulence noted on incision.  Indications for procedure: Patient is a 71 year old male who was admitted on July 24 due to pneumonia, altered mental status, malnutrition. Maxillofacial surgery was consulted for a dental abscess as potential source for his pneumonia. He is taken to the operating room for removal of retained root of tooth #11 as well as incision and drainage.  Procedure: The patient was identified in the preoperative holding by both anesthesia and the maxilla facial teams. Health history was reviewed. Consent was verified with the patient's nephew Trilby Drummer Strothers. The patient is brought back to the operating room and placed in the table in the supine position. Standard ASA leads and monitors were placed. MAC/IV sedation anesthesia was initiated and titrated to effect. The patient was anesthetized with 4 carpules of 2% lidocaine with 1 or thousand epinephrine in the anterior maxilla. Retained root at tooth #11 was identified. The gingival tissues released with a periosteal elevator. The root was luxated and extracted with dental forcep. The site was thoroughly curetted with removal gross amount of granular vomitus tissue. An incision was made in the maxillary vestibule to aid with drainage. There is no noted purulence on incision. The site was thoroughly irrigated and curetted. Gelfoam was placed. The tissues were then reapproximated with interrupted 3-0 chromic  gut suture. The patient tolerated procedure well. He was returned to the anesthesia care team where he emerged without event. He was transferred to the. Postanesthesia care unit for recovery. He will be transported back to the floor for continued observation.  Postop Recommendations: 1. Gauze pressure for hemostasis. 2. If able, recommend Chlorhexidine rinses tid for 2 weeks. 3. Sutures placed during procedure are resorbable. 4. Recommend Tylenol prn pain. 5. Patient can follow up with [facility] following d/c from the hospital.  -Please contact [phone] with questions/concerns.

## 2017-01-19 NOTE — Progress Notes (Signed)
CSW spoke with patient's nephew. He is unable to come to the hospital until later tonight. CSW left SNF list in patient's room for nephew to look over.   Osborne Cascoadia Deaisa Merida LCSWA [phone]

## 2017-01-19 NOTE — Anesthesia Preprocedure Evaluation (Signed)
Anesthesia Evaluation  Patient identified by MRN, date of birth, ID band Patient awake    Reviewed: Allergy & Precautions, NPO status , Patient's Chart, lab work & pertinent test results  Airway Mallampati: II  TM Distance: >3 FB     Dental   Pulmonary Current Smoker,    breath sounds clear to auscultation       Cardiovascular + Peripheral Vascular Disease   Rhythm:Regular Rate:Normal     Neuro/Psych    GI/Hepatic negative GI ROS, Neg liver ROS,   Endo/Other  negative endocrine ROS  Renal/GU negative Renal ROS     Musculoskeletal   Abdominal   Peds  Hematology negative hematology ROS (+)   Anesthesia Other Findings   Reproductive/Obstetrics                             Anesthesia Physical Anesthesia Plan  ASA: III  Anesthesia Plan: MAC   Post-op Pain Management:    Induction: Intravenous  PONV Risk Score and Plan: 1 and Ondansetron, Dexamethasone and Propofol infusion  Airway Management Planned: Nasal Cannula  Additional Equipment:   Intra-op Plan:   Post-operative Plan:   Informed Consent: I have reviewed the patients History and Physical, chart, labs and discussed the procedure including the risks, benefits and alternatives for the proposed anesthesia with the patient or authorized representative who has indicated his/her understanding and acceptance.   Dental advisory given  Plan Discussed with:   Anesthesia Plan Comments:         Anesthesia Quick Evaluation

## 2017-01-19 NOTE — Brief Op Note (Signed)
12/19/2016 - 02/14/2017  3:42 PM  PATIENT:  Jay Stephens  71 y.o. male  PRE-OPERATIVE DIAGNOSIS:  DENTAL ABSCESS  POST-OPERATIVE DIAGNOSIS:  DENTAL ABSCESS  PROCEDURE:  1. Surgical removal of tooth root #11 2. Incision and drainage of anterior maxillary abscess (intraoral)  SURGEON:  Surgeon(s) and Role:    * Maxfield Gildersleeve, DMD - Primary  ANESTHESIA:   IV sedation and MAC  EBL:  Total I/O In: 543.8 [I.V.:493.8; IV Piggyback:50] Out: 3 [Blood:3]  BLOOD ADMINISTERED:none  DRAINS: none   LOCAL MEDICATIONS USED:  LIDOCAINE   SPECIMEN:  No Specimen  DISPOSITION OF SPECIMEN:  N/A  COUNTS:  YES  TOURNIQUET:  * No tourniquets in log *  DICTATION: .Dragon Dictation  PLAN OF CARE: Admit to inpatient   PATIENT DISPOSITION:  PACU - hemodynamically stable.   Delay start of Pharmacological VTE agent (>24hrs) due to surgical blood loss or risk of bleeding: no

## 2017-01-19 NOTE — Anesthesia Postprocedure Evaluation (Signed)
Anesthesia Post Note  Patient: Jay Stephens  Procedure(s) Performed: Procedure(s) (LRB): SURGICAL EXCTRACTIONS OF ABSCESSED TOOTH/TEETH (N/A) INCISION AND DRAINAGE ABSCESS (N/A)     Patient location during evaluation: PACU Anesthesia Type: MAC Level of consciousness: awake Pain management: pain level controlled Vital Signs Assessment: post-procedure vital signs reviewed and stable Respiratory status: spontaneous breathing Cardiovascular status: stable Postop Assessment: no signs of nausea or vomiting Anesthetic complications: no    Last Vitals:  Vitals:   02/08/2017 1600 01/24/2017 1614  BP: 127/75 129/79  Pulse: 93 94  Resp: 20 20  Temp:  36.5 C    Last Pain:  Vitals:   02/06/2017 1326  TempSrc: Oral  PainSc:                  Jay Stephens
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
## 2017-01-19 NOTE — Anesthesia Procedure Notes (Signed)
Procedure Name: MAC Date/Time: 01/27/2017 1:12 PM Performed by: Mervyn Gay Pre-anesthesia Checklist: Patient identified, Patient being monitored, Timeout performed, Emergency Drugs available and Suction available Patient Re-evaluated:Patient Re-evaluated prior to induction Oxygen Delivery Method: Nasal cannula Preoxygenation: Pre-oxygenation with 100% oxygen Number of attempts: 1 Placement Confirmation: positive ETCO2 Dental Injury: Teeth and Oropharynx as per pre-operative assessment

## 2017-01-19 NOTE — Transfer of Care (Signed)
Immediate Anesthesia Transfer of Care Note  Patient: Jay Stephens  Procedure(s) Performed: Procedure(s): SURGICAL EXCTRACTIONS OF ABSCESSED TOOTH/TEETH (N/A) INCISION AND DRAINAGE ABSCESS (N/A)  Patient Location: PACU  Anesthesia Type:MAC  Level of Consciousness: drowsy and patient cooperative  Airway & Oxygen Therapy: Patient Spontanous Breathing and Patient connected to nasal cannula oxygen  Post-op Assessment: Report given to RN and Post -op Vital signs reviewed and stable  Post vital signs: Reviewed and stable  Last Vitals:  Vitals:   02/13/2017 0446 01/23/2017 1326  BP: (!) 162/98 (!) 164/87  Pulse: 94 95  Resp: (!) 22 16  Temp: 37 C 37.1 C    Last Pain:  Vitals:   02/05/2017 1326  TempSrc: Oral  PainSc:          Complications: No apparent anesthesia complications

## 2017-01-19 NOTE — Progress Notes (Signed)
Report received from Eddie Dibblesindy Z., RN from [phone]. Per Arline Aspindy, the pt is AAOx2, nephew [place] ServiceMelvin Duca was called to get telephone consent, but he wanted to speak to the surgeon before he would consent. She sts she tried to locate the surgeon , but was unsuccessful. She sts she also spoke with family medicine about the issue, but they have not contacted her back.

## 2017-01-19 NOTE — Consult Note (Signed)
Reason for Consult: dental abscess Referring Physician:  Dr. Gabriel Stephens  Jay Stephens is an 71 y.o. male.  HPI: Patient is a 71 year old male who was admitted on July 24 for altered mental status and right-sided empyema. He has been under the care of the family medicine service. Oral & Maxillofacial surgery was consulted for an anterior maxillary dental abscess as a suspected source for his pneumonia. His cultures from his chest tube drainage or consistent with strep viridans.  PMH: reviewed per inpatient chart  Social History:  reports that he has been smoking.  He has a 7.50 pack-year smoking history. He uses smokeless tobacco. His alcohol and drug histories are not on file.  Allergies: No Known Allergies  Medications: reviewed in chart  ROS Blood pressure 108/73, pulse 93, temperature (!) 97.2 F (36.2 C), resp. rate 19, height 5\' 9"  (1.753 m), weight 64.1 kg (141 lb 5 oz), SpO2 100 %. Physical Exam: No significant extraoral edema. Patient is completely edentulous except for a retained root at site #11 that is tender to palpation. There is not appear to be any active purulence at this time. The tooth root is mobile. His oropharynx is clear.  Review of maxillofacial CT showing a large periapical radiolucency associated with retained root at site #11 with completer obliteration of the facial cortical plate in this area. The radiolucency appears to be consistent with periapical cyst clot/granuloma. There does not appear to be any discernible abscess present.  Assessment/Plan: Anterior maxillary abscess associated with retained root at site #11. The patient will be taken to the operating room today for incision and drainage of anterior maxillary abscess, surgical removal of retained root at site #11. Risks/benefits/alternatives were discussed with the patient as well as the patient's nephew Jay Stephens(Jay Stephens). Even the patient's altered mental status, the nephew is given consent to proceed with the  procedure. This will be conducted in the operating room under IV sedation.  *Following the procedure: Postop Recommendations: 1. Gauze pressure for hemostasis. 2. If able, recommend Chlorhexidine rinses tid for 2 weeks. 3. Sutures placed during procedure are resorbable. 4. Recommend Tylenol prn pain. 5. Patient can follow up with [facility] following d/c from the hospital.  -Please contact [phone] with questions/concerns.  Jay Stephens, DMD Oral & Maxillofacial Surgeon [facility] [phone] 02/10/2017, 4:02 PM

## 2017-01-19 NOTE — Progress Notes (Signed)
SLP Cancellation Note  Patient Details Name: Jay Stephens MRN: [id number] DOB: 12/23/1945   Cancelled treatment:        Pt NPO for surgery. Will continue efforts.    [name], Nadiyah Zeis Willis 01/22/2017, 11:27 AM  [name] M.Ed [employer]-SLP Pager [phone]

## 2017-01-19 DEATH — deceased

## 2017-01-20 ENCOUNTER — Encounter (HOSPITAL_COMMUNITY): Payer: Self-pay | Admitting: Oral Surgery

## 2017-01-20 DIAGNOSIS — R54 Age-related physical debility: Secondary | ICD-10-CM

## 2017-01-20 LAB — BASIC METABOLIC PANEL
Anion gap: 4 — ABNORMAL LOW (ref 5–15)
BUN: 5 mg/dL — AB (ref 6–20)
CHLORIDE: 107 mmol/L (ref 101–111)
CO2: 25 mmol/L (ref 22–32)
CREATININE: 0.72 mg/dL (ref 0.61–1.24)
Calcium: 7.2 mg/dL — ABNORMAL LOW (ref 8.9–10.3)
GFR calc non Af Amer: >60 mL/min (ref >60)
Glucose, Bld: 157 mg/dL — ABNORMAL HIGH (ref 65–99)
POTASSIUM: 4.5 mmol/L (ref 3.5–5.1)
SODIUM: 136 mmol/L (ref 135–145)

## 2017-01-20 LAB — CBC
HEMATOCRIT: 29.5 % — AB (ref 39.0–52.0)
HEMOGLOBIN: 9.2 g/dL — AB (ref 13.0–17.0)
MCH: 25.8 pg — AB (ref 26.0–34.0)
MCHC: 31.2 g/dL (ref 30.0–36.0)
MCV: 82.9 fL (ref 78.0–100.0)
Platelets: 122 10*3/uL — ABNORMAL LOW (ref 150–400)
RBC: 3.56 MIL/uL — AB (ref 4.22–5.81)
RDW: 14.6 % (ref 11.5–15.5)
WBC: 5.2 10*3/uL (ref 4.0–10.5)

## 2017-01-20 LAB — GLUCOSE, CAPILLARY: Glucose-Capillary: 181 mg/dL — ABNORMAL HIGH (ref 65–99)

## 2017-01-20 MED ORDER — ACETAMINOPHEN 325 MG PO TABS: 325.0000 mg | ORAL_TABLET | Freq: Four times a day (QID) | ORAL | Status: DC | PRN

## 2017-01-20 MED ORDER — FENTANYL CITRATE (PF) 100 MCG/2ML IJ SOLN: 25.0000 ug | INTRAMUSCULAR | Status: DC | PRN
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
## 2017-01-20 NOTE — Care Management Important Message (Signed)
Important Message  Patient Details  Name: Jay Stephens MRN: [id number] Date of Birth: 10/20/1945   Medicare Important Message Given:  Yes    Oiva Dibari Abena 01/20/2017, 11:15 AM

## 2017-01-20 NOTE — Progress Notes (Signed)
1 Day Post-Op   [facility] POSTOP NOTE:  Subjective/Chief Complaint: Patient doing well, states that he feels good, denies any oral pain.   Objective: Vital signs in last 24 hours: Temp:  [97.2 F (36.2 C)-98.7 F (37.1 C)] 98.1 F (36.7 C) (08/02 0530) Pulse Rate:  [93-96] 93 (08/02 0530) Resp:  [16-20] 19 (08/02 0530) BP: (108-164)/(73-87) 138/81 (08/02 0530) SpO2:  [95 %-100 %] 100 % (08/02 0530) Weight:  [63.3 kg (139 lb 9.6 oz)] 63.3 kg (139 lb 9.6 oz) (08/02 0331) Last BM Date: 02/12/2017  Physical Exam: HEENT: not apparent facial edema, anterior maxillary wound is hemostatic, sutures are intact. Oropharynx clear.  Assessment/Plan: s/p Procedure(s): SURGICAL EXCTRACTIONS OF ABSCESSED TOOTH/TEETH (N/A) INCISION AND DRAINAGE ABSCESS (N/A)  *POD1 s/p extraction tooth #11 with I&D anterior maxillary abscess. Healing progressing well.  *Postop Recommendations: 1. Gauze pressure for hemostasis. 2. If able, recommend Chlorhexidine rinses tid for 2 weeks. 3. Sutures placed during procedure are resorbable. 4. Recommend Tylenol prn pain. 5. Patient can follow up with [facility] following d/c from the hospital.  -Please contact [phone] with questions/concerns.  Vivia EwingJustin Tia Gelb, DMD Oral & Maxillofacial Surgeon 01/20/2017

## 2017-01-20 NOTE — Progress Notes (Signed)
Family Medicine Teaching Service Daily Progress Note Intern Pager: [phone]  Patient name: Jay Stephens Medical record number: [id number] Date of birth: October 03, 1945 Age: 71 y.o. Gender: male  Primary Care Provider: System, Pcp Not In Consultants: palitative, nutrition, SW Code Status: full  Pt Overview and Major Events to Date:  Admitted on 01/16/17 for AMS. Intubated d/t respiratory distress and transferred to CCM on 7/24. Extubated and transferred to FPTS on 01/14/17.  Chest Tube placed to drain large Right emyema and drain removed.   Dental Abscess I/D performed 8/1.  Nephew consented to SNF placement with palliative to follow there.  Assessment and Plan: Admitted on 2017/01/16 for AMS. Intubated d/t respiratory distress and transferred to CCM on 7/24. Extubated and transferred to FPTS on 01/14/17.  Assessment and Plan: Jay Stephens a 20 y.o.malewith unknown PMH presenting with apparent altered mental status, R facial droop, and generalized weakness, found to have empyema in right side of chest.  Altered Mental Status with R facial droop and weakness: Last seen normal by his nephew in pm of 7/22. Patient apparently AAOx4 and able to do all ADLs at baseline, although physical appearance does not suggest this. Patient with no known medical history. CT scan (-) for acute stroke, but is positive for partial craniotomy with gliosis in right temporal lobe. UDSand alcohol(-). 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 Patient with very large empyema of right lung. Chest tube placed 7/24 with purulent output. Grew out strep viridans. Patient on antibiotics as follows: zosyn (7/24-7/28), vanc (7/24-7/25), ancef (7/28-) Blood cultures negative x 4 days. Patient with chest tube in place with 7/27-7/28 output of 575mL/24hrs. Patient passed swallow study on 7/29, now getting dysphagia 1 with honey thick liquids. R sided facial  droop is likely re-emergence of old symptoms due to stress from current illness per neurology.  Patient has improved a lot from admission; currently AOx3, GCS 15. - Vitals signs per step-down unit, pulse ox with vitals - daily BMP, CBC - continue give B1, folic acid - continue to monitor mental status - OT/PT/SLP following - dysphagia I, honey thick liquids - Ativan 1-4 mg Q4H PRN for CIWA > 8 - continue ancef - continue D51/2NS with 20 mEq/L KCl @75  until tolerating PO well  Right sided Empyema: CT chest confirmed large loculated hydropneumothorax on right chest as well as nonvisualization right upper lobe bronchus concerning for obstruction. Chest tube placed on 7/24 drained 3L pus from right chest empyema. Culture of empyema fluid positive for numerous Strep viridans bacteria, which could be related to his tooth abscess. Extubated successfully on 01/14/17. Repeat CXR on 01/15/17 shows improved right apical pneumothorax. WBC is 5.4. Patient remains afebrile. CT with out on 7/27-7/28. Due to strep viridans and known tooth abscess, have consulted Oral and maxillofacial surgery. They have requested a panoramic view of face. Tentative surgery scheduled for 8/1. Chest tube to waterseal on 7/29. cxr stable from overnight. Chest tube pulled 7/30. Continues to sat well on room air. - continue ceftriaxone (day 8/14) - monitor respiratory status - [facility] as needed to keep sats above 92% -dressing clean/dry   Tooth abscess: Poor dentition with periapical abscess around a left maxillary tooth (likely tooth number 11) seen on head CT. Could be source of strep viridans in empyema. Due to strep viridans and known tooth abscess, have consulted Oral and maxillofacial surgery. They have requested a panoramic view of face. Tentative surgery scheduled for 8/1. - Oral  surgery  Performed 8/1 - CT maxilofacial ordered because patient could not support panthogram -chlorhexadine rinse TID x2wks -clean wound  on inspection with clot visible and no current bleeding, discussed checking wound with nursing   Electrolyte abnormalities; Hypernatremia, hyperchloremia: Likely from dehydration, although electrolytes have been stable and remain elevated even with continued hydration. Sodium 137, Chloride 110on 7/30. - Monitor for s/s of hypernatremiaand hyperchloremia - Daily BMP - D51/2NS w/ KCl 1720mEq/L @ 75 mL/hr  Hypocalcemia Patient with low ionized calcium. Can replete with calcium gluconate if needed, although will opt to just watch for now. Calcium 7.1, corrected calcium 9.3. Serum calcium not low when corrected for poor albumin level. Consider ical if symptomatic. - daily bmp   Protein Calorie Malnutrition  Patient with albumin of 2.0 on admission and last albumin measured on 7/25 of 1.3, marked temporal wasting, weight of 100lbs. Patient very thin and very frail looking on exam. Will need protein supplementation. IV fluids for now given NPO status. Passed swallow study on 7/29. Getting dysphagia diet with honey thick liquids. - Nutrition consult - fluids as above  Uricemia Patient with bun of 38 on admission, now 12. Most likely due to muscle wasting prior to admission - will continue to watch - daily bmp - fluids as above  Elevated Transaminases Elevated AST up to 79 on admission, now 61. Patient with unknown history of alcohol use or any liver disorder.  -Will continue to trend   PMH is unknown  FEN/GI: dysphagia I, honey thick liquids Prophylaxis: pantoprazole, SCDs   Disposition: SNF (nephew consented), will work with CSW on arranging this; palliative to follow  Subjective:  Patient not complaining of pain but did want to eat, we discussed concern for his swallowing AOx2  Objective: Temp:  [97.2 F (36.2 C)-98.7 F (37.1 C)] 98.1 F (36.7 C) (08/02 0530) Pulse Rate:  [93-96] 93 (08/02 0530) Resp:  [16-20] 19 (08/02 0530) BP: (108-164)/(73-87) 138/81 (08/02  0530) SpO2:  [95 %-100 %] 100 % (08/02 0530) Weight:  [139 lb 9.6 oz (63.3 kg)] 139 lb 9.6 oz (63.3 kg) (08/02 0331) Physical Exam: General: frail appearing, alert/oriented, conversational Cardiovascular: RRR, no murmurs noted Respiratory: decreased breath sounds on RLL, dressing from recent CT drain was clean and dry Abdomen: No tenderness to palpation, soft belly Extremities: no complaints of pain, decreased strength/slow movements bilaterally likely due to frailty Oral wound: superior central, nonbleeding currently, stitches inplace  Laboratory:  Recent Labs Lab 01/18/17 0346 01/18/17 0729 01/30/2017 0353 01/20/17 0405  WBC 4.5  --  5.0 5.2  HGB 8.8* 9.8* 9.3* 9.2*  HCT 27.7* 31.9* 29.8* 29.5*  PLT 96*  --  104* 122*    Recent Labs Lab 01/18/17 0346 02/12/2017 0353 01/20/17 0405  NA 137 135 136  K 4.1 4.7 4.5  CL 110 107 107  CO2 24 22 25   BUN 10 9 5*  CREATININE 0.78 0.75 0.72  CALCIUM 7.0* 7.1* 7.2*  GLUCOSE 163* 246* 157*      Imaging/Diagnostic Tests: Dg Chest 2 View  Result Date: 01/02/2017 CLINICAL DATA:  Hydropneumothorax.  Shortness of breath. EXAM: CHEST  2 VIEW COMPARISON:  Chest CT performed immediately prior. FINDINGS: Right hydropneumothorax, with fluid level demonstrated on the lateral view. There is leftward mediastinal shift. Mediastinal contours are unchanged, right heart border obscured by right lung opacity. No left pneumothorax. No pulmonary edema. Fracture of left lateral fourth ribs is again seen. IMPRESSION: Large right hydropneumothorax with leftward mediastinal shift. Please reference concurrently performed  chest CT for more detailed evaluation. Electronically Signed   By: Rubye Oaks M.D.   On: 12/21/2016 20:53   Ct Head Wo Contrast  Result Date: 01/11/2017 CLINICAL DATA:  71 year old male with history of altered mental status and facial droop. EXAM: CT HEAD WITHOUT CONTRAST TECHNIQUE: Contiguous axial images were obtained from the base  of the skull through the vertex without intravenous contrast. COMPARISON:  None. FINDINGS: Brain: Mild cerebral atrophy. Well-defined area of low attenuation and volume loss in the anterior aspect of the right temporal lobe, compatible with encephalomalacia/gliosis. Physiologic calcifications in the basal ganglia bilaterally. No evidence of acute infarction, hemorrhage, hydrocephalus, extra-axial collection or mass lesion/mass effect. Vascular: No hyperdense vessel or unexpected calcification. Skull: Postoperative changes of prior right pterional craniectomy. Sinuses/Orbits: Multifocal mucoperiosteal thickening in the maxillary sinuses bilaterally. Other: Poor dentition with multiple missing teeth, and periapical abscess associated with one of the left maxillary teeth (likely tooth number 11). IMPRESSION: 1. No acute intracranial abnormalities. 2. Chronic area of encephalomalacia/gliosis in the right temporal lobe, likely postoperative. 3. Mild cerebral atrophy. 4. Changes of chronic sinusitis in the maxillary sinuses bilaterally. 5. Poor dentition with periapical abscess around a left maxillary tooth (likely tooth number 11). Electronically Signed   By: Trudie Reed M.D.   On: 01/09/2017 17:03   Ct Chest Wo Contrast  Result Date: 12/24/2016 CLINICAL DATA:  Patient came in with AMS today. CXR showed opacified right lung EXAM: CT CHEST WITHOUT CONTRAST TECHNIQUE: Multidetector CT imaging of the chest was performed following the standard protocol without IV contrast. COMPARISON:  None. FINDINGS: Cardiovascular: Extensive coronary calcifications. Heart size normal. No significant pericardial effusion. Mediastinum/Nodes: Leftward mediastinal shift. Limited assessment for adenopathy due to adjacent opacities and lack of IV contrast. Lungs/Pleura: Large loculated right pleural effusion. The dominant component is posteriorly at the base, extending across midline. This extends along the posteromedial aspect of the  pleural space to the apex with a large anterolateral component at the apex containing gas/fluid level. There is marked atelectasis of the right long with only trivial aerated segment in the lower lobe. Right upper lobe bronchus is occluded proximally. It is difficult to exclude a central mass. Left lung unremarkable.  No left effusion. Upper Abdomen: No acute findings. Musculoskeletal: Minimal spondylitic changes in the visualized lower cervical spine. Old fracture deformity of the left fourth rib. No acute fracture or worrisome bone lesion. IMPRESSION: 1. Large right loculated hydropneumothorax, with atelectatic right lung and leftward mediastinal shift. 2. Nonvisualization of right upper lobe bronchus suggesting central occluding lesion. Follow-up recommended. 3. Coronary calcifications. Electronically Signed   By: Corlis Leak M.D.   On: 12/19/2016 21:00   Dg Chest Port 1 View  Result Date: 01/11/2017 CLINICAL DATA:  Central line placement EXAM: PORTABLE CHEST 1 VIEW COMPARISON:  01/11/2017, 01/11/2017, 12/27/2016 FINDINGS: Endotracheal tube tip is just above the carina. Esophageal tube tip in the left upper quadrant. Placement of right-sided central venous catheter with tip overlying the SVC. Small moderate right apical pneumothorax re- demonstrated without significant interval change, 2.5 cm pleural-parenchymal separation at the apex. Right lower chest tube is in place with slight angulated appearance at the right lower lateral chest wall. Small residual right pleural effusion. Clear left lung. Stable cardiomediastinal silhouette. IMPRESSION: 1. Right-sided central venous catheter tip overlies the distal SVC 2. Endotracheal tube tip near the carina 3. No change in right pneumothorax or small right-sided pleural effusion. Slight angulated appearance of right lower chest drainage catheter at the lateral lower chest  wall. Electronically Signed   By: Jasmine PangKim  Fujinaga M.D.   On: 01/11/2017 15:58   Dg Chest Port 1  View  Result Date: 01/11/2017 CLINICAL DATA:  Chest tube placement EXAM: PORTABLE CHEST 1 VIEW COMPARISON:  01/11/2017 . FINDINGS: Interim placement right chest tube, its tip is over the right lower chest and incompletely imaged. Interval removal of right pleural effusion Right-sided pneumothorax again noted without significant change. Right lung is clear. IMPRESSION: 1. Interval placement of right chest tube. Interval removal of right pleural effusion. Stable right pneumothorax. Right lung is clear. 2. Endotracheal tube and NG tube in stable position . Electronically Signed   By: Maisie Fushomas  Register   On: 01/11/2017 11:12   Portable Chest Xray  Result Date: 01/11/2017 CLINICAL DATA:  Assess positioning of the endotracheal tube. EXAM: PORTABLE CHEST 1 VIEW COMPARISON:  PA and lateral chest x-ray of January 10, 2017 FINDINGS: There is persistent near-total opacification of the right hemithorax with a faint pleural line suspected in the apex. There is mild shift of the mediastinum toward the left. The left lung is well-expanded and clear. The endotracheal tube tip lies approximately 3.2 cm above the carina. The esophagogastric tube tip in proximal port lie in the gastric cardia. IMPRESSION: Reasonable positioning of the endotracheal and esophagogastric tubes. Further worsening of opacification throughout the right hemithorax consistent with known hydropneumothorax. Electronically Signed   By: David  SwazilandJordan M.D.   On: 01/11/2017 10:25   Dg Chest Portable 1 View  Result Date: 01/07/2017 CLINICAL DATA:  ams, facial droop. non verbal EXAM: PORTABLE CHEST - 1 VIEW COMPARISON:  none FINDINGS: There is near complete opacification of the right hemithorax. Lucency towards the apex but no definite lung markings suggesting possible layering hydropneumothorax. There is mediastinal shift from right to left suggesting a degree of mass effect or tension. Heart size and mediastinal contours are within normal limits. Left lungs  clear. Heart size difficult to assess due to adjacent opacity. Fracture the lateral aspect left third rib, age indeterminate. Fixation hardware projects over the mandible. IMPRESSION: 1. Opacification of the RIGHT hemithorax with some mass effect on the mediastinum. Can't exclude layering hydropneumothorax. Critical Value/emergent results were called by telephone at the time of interpretation on 01/01/2017 at 4:54 pm to Dr. Benjiman CoreNATHAN PICKERING , who verbally acknowledged these results. 2. LEFT third rib fracture, age indeterminate. Electronically Signed   By: Corlis Leak  Hassell M.D.   On: 12/25/2016 16:55   Dg Abd Portable 1v  Result Date: 01/11/2017 CLINICAL DATA:  71 year old male with orogastric tube placement. Initial encounter. EXAM: PORTABLE ABDOMEN - 1 VIEW COMPARISON:  None. FINDINGS: Nasogastric tube curled within the left upper quadrant of the abdomen within the region of the gastric fundus. Abnormal bowel gas pattern with featureless loops of bowel (small bowel/ colon) left abdomen with paucity of bowel gas for right abdomen. Loculated right hydropneumothorax. The possibility of free intraperitoneal air cannot be assessed on a supine view. Possible vascular calcifications. IMPRESSION: Nasogastric tube curled within the left upper quadrant of the abdomen within the region of the gastric fundus. Abnormal bowel gas pattern with featureless loops of bowel (small bowel/ colon) left abdomen with paucity of bowel gas for right abdomen. Electronically Signed   By: Lacy DuverneySteven  Olson M.D.   On: 01/11/2017 10:27     Marthenia RollingBland, Avital Dancy, DO 01/20/2017, 6:46 AM PGY-1, [facility] FPTS Intern pager: [phone], text pages welcome

## 2017-01-21 ENCOUNTER — Emergency Department (HOSPITAL_COMMUNITY): Payer: Medicare Other

## 2017-01-21 ENCOUNTER — Inpatient Hospital Stay (HOSPITAL_COMMUNITY)
Admission: EM | Admit: 2017-01-21 | Discharge: 2017-02-19 | Disposition: E | Payer: Medicare Other | Source: Home / Self Care | Attending: Family Medicine | Admitting: Family Medicine

## 2017-01-21 ENCOUNTER — Encounter (HOSPITAL_COMMUNITY): Payer: Self-pay

## 2017-01-21 DIAGNOSIS — Z515 Encounter for palliative care: Secondary | ICD-10-CM

## 2017-01-21 DIAGNOSIS — I700 Atherosclerosis of aorta: Secondary | ICD-10-CM | POA: Diagnosis present

## 2017-01-21 DIAGNOSIS — J9601 Acute respiratory failure with hypoxia: Secondary | ICD-10-CM

## 2017-01-21 DIAGNOSIS — Z7189 Other specified counseling: Secondary | ICD-10-CM

## 2017-01-21 DIAGNOSIS — R4182 Altered mental status, unspecified: Secondary | ICD-10-CM | POA: Diagnosis present

## 2017-01-21 DIAGNOSIS — R54 Age-related physical debility: Secondary | ICD-10-CM | POA: Diagnosis present

## 2017-01-21 DIAGNOSIS — R0603 Acute respiratory distress: Secondary | ICD-10-CM | POA: Diagnosis present

## 2017-01-21 DIAGNOSIS — R188 Other ascites: Secondary | ICD-10-CM | POA: Diagnosis present

## 2017-01-21 DIAGNOSIS — J690 Pneumonitis due to inhalation of food and vomit: Secondary | ICD-10-CM | POA: Diagnosis present

## 2017-01-21 DIAGNOSIS — R401 Stupor: Secondary | ICD-10-CM | POA: Diagnosis present

## 2017-01-21 DIAGNOSIS — Z4659 Encounter for fitting and adjustment of other gastrointestinal appliance and device: Secondary | ICD-10-CM

## 2017-01-21 DIAGNOSIS — R0902 Hypoxemia: Secondary | ICD-10-CM

## 2017-01-21 DIAGNOSIS — J189 Pneumonia, unspecified organism: Secondary | ICD-10-CM

## 2017-01-21 DIAGNOSIS — E43 Unspecified severe protein-calorie malnutrition: Secondary | ICD-10-CM | POA: Diagnosis present

## 2017-01-21 DIAGNOSIS — R296 Repeated falls: Secondary | ICD-10-CM

## 2017-01-21 DIAGNOSIS — R0602 Shortness of breath: Secondary | ICD-10-CM

## 2017-01-21 DIAGNOSIS — I5189 Other ill-defined heart diseases: Secondary | ICD-10-CM

## 2017-01-21 DIAGNOSIS — Z01818 Encounter for other preprocedural examination: Secondary | ICD-10-CM

## 2017-01-21 DIAGNOSIS — R1310 Dysphagia, unspecified: Secondary | ICD-10-CM

## 2017-01-21 DIAGNOSIS — J90 Pleural effusion, not elsewhere classified: Secondary | ICD-10-CM

## 2017-01-21 DIAGNOSIS — I519 Heart disease, unspecified: Secondary | ICD-10-CM

## 2017-01-21 DIAGNOSIS — J869 Pyothorax without fistula: Secondary | ICD-10-CM | POA: Diagnosis present

## 2017-01-21 DIAGNOSIS — L89152 Pressure ulcer of sacral region, stage 2: Secondary | ICD-10-CM

## 2017-01-21 DIAGNOSIS — Z4682 Encounter for fitting and adjustment of non-vascular catheter: Secondary | ICD-10-CM

## 2017-01-21 DIAGNOSIS — J439 Emphysema, unspecified: Secondary | ICD-10-CM | POA: Diagnosis present

## 2017-01-21 DIAGNOSIS — J939 Pneumothorax, unspecified: Secondary | ICD-10-CM | POA: Diagnosis present

## 2017-01-21 LAB — COMPREHENSIVE METABOLIC PANEL
ALK PHOS: 102 U/L (ref 38–126)
ALT: 14 U/L — ABNORMAL LOW (ref 17–63)
ANION GAP: 12 (ref 5–15)
AST: 47 U/L — ABNORMAL HIGH (ref 15–41)
Albumin: 1.3 g/dL — ABNORMAL LOW (ref 3.5–5.0)
BILIRUBIN TOTAL: 0.9 mg/dL (ref 0.3–1.2)
BUN: 5 mg/dL — ABNORMAL LOW (ref 6–20)
CALCIUM: 7.5 mg/dL — AB (ref 8.9–10.3)
CO2: 23 mmol/L (ref 22–32)
Chloride: 101 mmol/L (ref 101–111)
Creatinine, Ser: 0.82 mg/dL (ref 0.61–1.24)
GFR calc Af Amer: >60 mL/min (ref >60)
GLUCOSE: 186 mg/dL — AB (ref 65–99)
POTASSIUM: 4.5 mmol/L (ref 3.5–5.1)
Sodium: 136 mmol/L (ref 135–145)
TOTAL PROTEIN: 6.1 g/dL — AB (ref 6.5–8.1)

## 2017-01-21 LAB — I-STAT ARTERIAL BLOOD GAS, ED
ACID-BASE DEFICIT: 2 mmol/L (ref 0.0–2.0)
Bicarbonate: 22.9 mmol/L (ref 20.0–28.0)
O2 SAT: 88 %
PCO2 ART: 39.1 mmHg (ref 32.0–48.0)
PH ART: 7.375 (ref 7.350–7.450)
Patient temperature: 98.6
TCO2: 24 mmol/L (ref 0–100)
pO2, Arterial: 57 mmHg — ABNORMAL LOW (ref 83.0–108.0)

## 2017-01-21 LAB — BASIC METABOLIC PANEL
Anion gap: 6 (ref 5–15)
BUN: 5 mg/dL — ABNORMAL LOW (ref 6–20)
CHLORIDE: 106 mmol/L (ref 101–111)
CO2: 23 mmol/L (ref 22–32)
CREATININE: 0.72 mg/dL (ref 0.61–1.24)
Calcium: 7.3 mg/dL — ABNORMAL LOW (ref 8.9–10.3)
GFR calc non Af Amer: >60 mL/min (ref >60)
Glucose, Bld: 152 mg/dL — ABNORMAL HIGH (ref 65–99)
POTASSIUM: 4.5 mmol/L (ref 3.5–5.1)
Sodium: 135 mmol/L (ref 135–145)

## 2017-01-21 LAB — CBC
HEMATOCRIT: 31.9 % — AB (ref 39.0–52.0)
Hemoglobin: 9.9 g/dL — ABNORMAL LOW (ref 13.0–17.0)
MCH: 25.8 pg — AB (ref 26.0–34.0)
MCHC: 31 g/dL (ref 30.0–36.0)
MCV: 83.3 fL (ref 78.0–100.0)
PLATELETS: 148 10*3/uL — AB (ref 150–400)
RBC: 3.83 MIL/uL — AB (ref 4.22–5.81)
RDW: 14.8 % (ref 11.5–15.5)
WBC: 5 10*3/uL (ref 4.0–10.5)

## 2017-01-21 LAB — CBC WITH DIFFERENTIAL/PLATELET
HEMATOCRIT: 33.3 % — AB (ref 39.0–52.0)
HEMOGLOBIN: 10.5 g/dL — AB (ref 13.0–17.0)
MCH: 26.4 pg (ref 26.0–34.0)
MCHC: 31.5 g/dL (ref 30.0–36.0)
MCV: 83.9 fL (ref 78.0–100.0)
Platelets: 150 10*3/uL (ref 150–400)
RBC: 3.97 MIL/uL — AB (ref 4.22–5.81)
RDW: 14.9 % (ref 11.5–15.5)
WBC: 12.9 10*3/uL — AB (ref 4.0–10.5)

## 2017-01-21 LAB — URINALYSIS, ROUTINE W REFLEX MICROSCOPIC
Bilirubin Urine: NEGATIVE
Glucose, UA: NEGATIVE mg/dL
Hgb urine dipstick: NEGATIVE
Ketones, ur: NEGATIVE mg/dL
LEUKOCYTES UA: NEGATIVE
NITRITE: NEGATIVE
PH: 6 (ref 5.0–8.0)
Protein, ur: NEGATIVE mg/dL
SPECIFIC GRAVITY, URINE: 1.006 (ref 1.005–1.030)

## 2017-01-21 LAB — MAGNESIUM: Magnesium: 1.4 mg/dL — ABNORMAL LOW (ref 1.7–2.4)

## 2017-01-21 LAB — I-STAT CG4 LACTIC ACID, ED: LACTIC ACID, VENOUS: 5.59 mmol/L — AB (ref 0.5–1.9)

## 2017-01-21 LAB — PHOSPHORUS: Phosphorus: 2.7 mg/dL (ref 2.5–4.6)

## 2017-01-21 LAB — I-STAT TROPONIN, ED: Troponin i, poc: 0.04 ng/mL (ref 0.00–0.08)

## 2017-01-21 LAB — BRAIN NATRIURETIC PEPTIDE: B NATRIURETIC PEPTIDE 5: 640.2 pg/mL — AB (ref 0.0–100.0)

## 2017-01-21 MED ORDER — SODIUM CHLORIDE 0.9 % IV BOLUS (SEPSIS)
1000.0000 mL | Freq: Once | INTRAVENOUS | Status: AC
  Administered 2017-01-21: 1000 mL via INTRAVENOUS

## 2017-01-21 MED ORDER — VANCOMYCIN HCL IN DEXTROSE 1-5 GM/200ML-% IV SOLN
1000.0000 mg | Freq: Once | INTRAVENOUS | Status: AC
  Administered 2017-01-21: 1000 mg via INTRAVENOUS
  Filled 2017-01-21: qty 200

## 2017-01-21 MED ORDER — PIPERACILLIN-TAZOBACTAM 3.375 G IVPB 30 MIN
3.3750 g | Freq: Once | INTRAVENOUS | Status: AC
  Administered 2017-01-21: 3.375 g via INTRAVENOUS
  Filled 2017-01-21: qty 50

## 2017-01-21 MED ORDER — AMOXICILLIN-POT CLAVULANATE 875-125 MG PO TABS: 1.0000 | ORAL_TABLET | Freq: Two times a day (BID) | ORAL | 0 refills | Status: AC

## 2017-01-21 NOTE — H&P (Signed)
[facility] Admission History and Physical Service Pager: [phone]  Patient name: Jay Stephens Medical record number: [id number] Date of birth: [birth year] Age: 71 y.o. Gender: male  Primary Care Provider: System, Pcp Not In Consultants: none Code Status: FULL  Chief Complaint: respiratory distress  Assessment and Plan: Jay Stephens is a 71 y.o. male presenting with acute onset respiratory distress with desaturations into the 70s. PMH is significant for right sided empyema s/p drainage via chest tube, altered mental status, R facial droop, tooth abscess s/p extraction and I&D, protein-calorie malnutrition, electrolyte abnormalities and generalized weakness.  Respiratory distress, acute likely 2/2 pneumonia: On Admission Patient met SIRS criteria with lactic acid of 5.6, tachycardia to 134, tachypnea on CPAP was satting 80% with EMS. Patient was placed on BiPAP with 100% SpO2.  Code sepsis was initiated and patient  received 2L NS bolus and vanc/zosyn in ED.  BNP was elevated at 640, however echo from previous admission showed EF 55-60%.  WBC 12.9, a sharp increase from 5.0 this morning prior to discharge.  CXR showed new airspace disease throughout the right chest with a new right basilar effusion concerning for new pneumonia.  ABG with pH 7.375, pO2 57, pCO2 39.  Right upper lung field rhonchorous on auscultation with minimal lung sounds heard in right lung base. Dressing on site of chest tube insertion from previous admission is partially soaked with pus like fluid, although no active purulent drainage noted during exam.  No fluctuance was palpated around chest tube site.  Differential includes new pneumonia or reaccumulation of empyema.  Volume overload from heart failure less likely given results of recent Echo and lack of edema or crackles on lung exam. Given symptoms on presentation, Pulmonary Embolism would also be part of our differential with tachycardia, tachypnea,  and sudden onset of respiratory distress. Well's criteria score of 1. Of note, patient has an obstructing lesion of right upper lobe bronchus, so patient may have a mass there that could be contributing to his pulmonary illness.  He was discharged from our service earlier today on Augmentin for S. Viridans found in empyema. -Admit to [facility], admitting physician Dr. McDiarmid -Admit to step down  -continue on BiPAP, will consult RT for management of oxygen delivery -continue vancomycin/zosyn -f/u procalcitonin -consider Lasix w/ worsening respiratory status and increase BNP -continuous pulse ox -am EKG -trend lactic acid -am CBC, BMP -follow blood cultures -follow urine culture -hold fluids for now -NPO -vitals per unit routine  Recent empyema s/p drainage via chest tube: Chest tube site possible source of infection since nurse reported that it was draining purulent fluid prior to discharge, and the dressing was noted to be partially soaked with yellow fluid, however no fluctuance palpated,  no erythema or pus expressed upon manipulation.  No recollection of empyema noted on CXR. -monitor chest tube site daily -continue vanc/zosyn  AMS with R-sided facial droop and weakness: Patient is alert and nods in response to questions due to being on BiPAP.  No AMS from baseline as far as we can tell, but exam is less accurate due to patient's inability to speak d/t BiPAP.  No facial droop noted.  Patient has a stage 2 sacral ulcer as well. -monitor mental status daily -monitor sacral ulcer, consider wound care consult  Tooth abscess s/p extraction and I&D: Patient denied pain in mouth in the ED.  Since the extraction occurred so recently (on 8/1), will need to be monitored for any infection or bleeding. -continue to  monitor daily  Protein-calorie malnutrition: Patient is cachectic, which is likely due to a chronic illness such as cancer as well as his low function at baseline.  Nephew reported  earlier that he has to be fed at home.  Albumin on admission is 1.3.  Seen by nutrition and palliative care during his recent hospitalization.  -monitor Mag, Phos, K daily for refeeding syndrome -continue to follow nutrition and palliative care recs   Electrolyte abnormalities: patient experienced hypernatremia and hyperchloremia as well as hypocalcemia during previous admission.  In the ED, Na 136, K 4.5, Ca 7.5. -daily BMP  FEN/GI: NPO Prophylaxis: Lovenox  Disposition: return to nursing home pending clinical improvement  History of Present Illness:  Jay Stephens is a 71 y.o. male presenting with acute-onset respiratory distress.  After being discharged from our service earlier today, patient was eating lunch at his SNF when he suddenly became short of breath, with an SpO2 in the 70s.  EMS was called, and they put him on CPAP, which resulted in an SpO2 of 80%.  He was then transitioned to BiPAP, which brought his pulse ox to 100%.  On arrival to the ED, lactic acid was 5.6, he was tachypneic to 38, and tachycardic to 134.  Patient remained afebrile.  Patient received 2L NS boluses due to code sepsis qualification.  Patient denied any pain at the site of his previous chest tube.  He also denied pain in his mouth.  History was obtained from ED staff given patient's requirement for BiPAP.  Review Of Systems: Per HPI with the following additions:   Review of Systems  Unable to perform ROS: Acuity of condition    Patient Active Problem List   Diagnosis Date Noted  . Respiratory distress 01/29/2017  . Pressure ulcer of sacrum 02/12/2017  . Pneumothorax on right   . Palliative care encounter   . Abscessed tooth   . Protein-calorie malnutrition ([place]) 01/11/2017  . Empyema ([place])   . 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 Altered mental status 01/02/2017    Past Medical History: History reviewed. No pertinent past medical history.  Past Surgical History: Past Surgical History:   Procedure Laterality Date  . DENTAL RESTORATION/EXTRACTION WITH X-RAY N/A 01/21/2017   Procedure: SURGICAL EXCTRACTIONS OF ABSCESSED TOOTH/TEETH;  Surgeon: Michael Litter, DMD;  Location: [facility];  Service: Oral Surgery;  Laterality: N/A;  . INCISION AND DRAINAGE ABSCESS N/A 01/30/2017   Procedure: INCISION AND DRAINAGE ABSCESS;  Surgeon: Michael Litter, DMD;  Location: [facility];  Service: Oral Surgery;  Laterality: N/A;    Social History: Social History  Substance Use Topics  . Smoking status: Current Every Day Smoker    Packs/day: 0.50    Years: 15.00  . Smokeless tobacco: Current User  . Alcohol use Not on file   Additional social history: Patient was living in a motel with his nephew and his nephew's 33 year old son.    Please also refer to relevant sections of EMR.  Family History: No family history on file.   Allergies and Medications: No Known Allergies No current facility-administered medications on file prior to encounter.    Current Outpatient Prescriptions on File Prior to Encounter  Medication Sig Dispense Refill  . amoxicillin-clavulanate (AUGMENTIN) 875-125 MG tablet Take 1 tablet by mouth 2 (two) times daily. 13 tablet 0    Objective: BP (!) 148/89   Pulse (!) 110   Temp (!) 97 F (36.1 C)   Resp (!) 22   SpO2  100%  Exam: General: cachectic, elderly male inclined in bed on BiPAP, in NAD Eyes: EOMI ENTM: dry mucous membranes, BiPAP covering mouth Neck: supple, nontender Cardiovascular: RRR, no MRG, no JVD Respiratory: rhonchi in right upper lung field, diminished lung sounds in right base, left lung fields clear to auscultation. Right chest wound without fluctuance, erythema or drainage, mildly tender to palpation Gastrointestinal: +bowel sounds, nontender MSK: thin extremities, generalized weakness Derm: grade 2 sacral ulcer, no other lesions noted Neuro: alert, can answer yes or no questions Psych: mood seems appropriate, unable to assess fully  Labs and  Imaging: CBC BMET   Recent Labs Lab 02/03/2017 1937  WBC 12.9*  HGB 10.5*  HCT 33.3*  PLT 150    Recent Labs Lab 02/09/2017 1937  NA 136  K 4.5  CL 101  CO2 23  BUN 5*  CREATININE 0.82  GLUCOSE 186*  CALCIUM 7.5*    Lactic Acid: 5.6  Arterial Blood Gas: pH 7.375 pO2 57 pCO2 39  Dg Chest Portable 1 View  Result Date: 02/10/2017 CLINICAL DATA:  Shortness of breath.  History of empyema. EXAM: PORTABLE CHEST 1 VIEW COMPARISON:  PA and lateral chest 02/03/2017 and 01/18/2017. CT chest 01/16/2017. FINDINGS: Pleural effusion which appears loculated along the right chest wall is again seen. Right basilar effusion is new since the most recent examination. There is new airspace disease throughout the right chest. The left lung is clear. Heart size is normal. IMPRESSION: New airspace disease throughout the right chest with a new right basilar effusion worrisome for pneumonia. Loculated right pleural effusion does not appear notably changed since the most recent exam. Electronically Signed   By: Inge Rise M.D.   On: 01/24/2017 20:03     Kathrene Alu, MD 02/14/2017, 10:41 PM PGY-1, [facility] [place] Intern pager: [phone], text pages welcome  I have seen and evaluated the patient with Dr. Shan Levans. I am in agreement with the note above in its revised form. My additions are in blue.  Marjie Skiff, MD Family Medicine, PGY-2

## 2017-01-21 NOTE — Progress Notes (Signed)
  Speech Language Pathology Treatment: Dysphagia  Patient Details Name: Jay Stephens MRN: [id number] DOB: 02/12/1946 Today's Date: 02/13/2017 Time: [id number] SLP Time Calculation (min) (ACUTE ONLY): 14 min  Assessment / Plan / Recommendation Clinical Impression  Mr Jay Stephens denied oral pain during meals since dental extraction of 2 teeth yesterday. Delayed cough x 1 (2-3 minutes) following nectar thick and puree trials indicative of sensation after possible penetration. Recommend continue Dys 1 (puree) diet and nectar thick liquids, crush meds and full supervision. Follow up ST at SNF for safety with po's and readiness for outpatient MBS for liquid upgrade.    HPI: [name] is a 71yo male with unknown PMH per EMR who [facility] for AMS, R facial droop, and generalized weakness. Last seen normal by nephew 7/22 PM. Per nephew, apparently A&Ox4 at baselineand able to someADLs but requires some assistance. CT head negative for stroke but with area of encephalomalacia/gliosis in R temporal lobe. CT chest with large loculated hydropneumothorax on R chest and nonvisualization of RUL bronchus. Intubated 7/24-7/27. CXR 7/28 showed diffuse right lung infiltrate.      SLP Plan  Continue with current plan of care       Recommendations  Diet recommendations: Dysphagia 1 (puree);Nectar-thick liquid Liquids provided via: Cup Medication Administration: Crushed with puree Supervision: Staff to assist with self feeding;Full supervision/cueing for compensatory strategies Compensations: Minimize environmental distractions;Slow rate;Small sips/bites Postural Changes and/or Swallow Maneuvers: Seated upright 90 degrees                Oral Care Recommendations: Oral care BID Follow up Recommendations: Skilled Nursing facility SLP Visit Diagnosis: Dysphagia, unspecified (R13.10) Plan: Continue with current plan of care       GO                Jay Stephens, Jay Stephens 02/18/2017, 2:58  PM  Jay CoonsLisa Stephens Jay FaceLitaker M.Ed [employer]-SLP Pager [phone]

## 2017-01-21 NOTE — Discharge Instructions (Signed)
You were hospitalized with an infection in your lung. You underwent oral surgery while you were hospitalized. You will be discharged to a skilled nursing facility.

## 2017-01-21 NOTE — ED Provider Notes (Signed)
[facility]-EMERGENCY DEPT Provider Note   CSN: [id number] Arrival date & time: 02/04/2017  1919     History   Chief Complaint Chief Complaint  Patient presents with  . Respiratory Distress    HPI Jay Stephens is a 71 y.o. male.  HPI Level 5 caveat due to mental status changes and acuity of situation.  History is provided by EMS. Patient just discharged from the hospital today after prolonged hospitalization. He had empyema of the right lung status post chest tube placement and 14 days of antibiotics. Patient had return to nursing facility and after he ate lunch, he had sudden onset of shortness of breath. EMS was called and on their arrival his pulse ox was in the 70s on room air. He was placed on CPAP. He did receive 5 mg of albuterol, 1 nitroglycerin, 125 mg of Solu-Medrol per EMS.   History reviewed. No pertinent past medical history.  Patient Active Problem List   Diagnosis Date Noted  . Pressure ulcer of sacrum 01/22/2017  . Pneumothorax on right   . Palliative care encounter   . Abscessed tooth   . Protein-calorie malnutrition ([facility]) 01/11/2017  . Empyema ([facility])   . 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 Altered mental status 12/30/2016    Past Surgical History:  Procedure Laterality Date  . DENTAL RESTORATION/EXTRACTION WITH X-RAY N/A 01/20/2017   Procedure: SURGICAL EXCTRACTIONS OF ABSCESSED TOOTH/TEETH;  Surgeon: Vivia Ewing, DMD;  Location: [facility];  Service: Oral Surgery;  Laterality: N/A;  . INCISION AND DRAINAGE ABSCESS N/A 02/06/2017   Procedure: INCISION AND DRAINAGE ABSCESS;  Surgeon: Vivia Ewing, DMD;  Location: [facility];  Service: Oral Surgery;  Laterality: N/A;       Home Medications    Prior to Admission medications   Medication Sig Start Date End Date Taking? Authorizing Provider  amoxicillin-clavulanate (AUGMENTIN) 875-125 MG tablet Take 1 tablet by mouth 2 (two) times daily. 02/06/2017 01/28/17 Yes Howard Pouch, MD    Family History No family history on  file.  Social History Social History  Substance Use Topics  . Smoking status: Current Every Day Smoker    Packs/day: 0.50    Years: 15.00  . Smokeless tobacco: Current User  . Alcohol use Not on file     Allergies   Patient has no known allergies.   Review of Systems Review of Systems  Unable to perform ROS: Mental status change     Physical Exam Updated Vital Signs BP (!) 146/92   Pulse (!) 121   Temp (!) 97 F (36.1 C)   Resp (!) 32   SpO2 99%   Physical Exam Physical Exam  Nursing note and vitals reviewed. Constitutional: Cachectic, chronically ill-appearing, non-toxic, and in moderate respiratory distress Head: Normocephalic and atraumatic.  Mouth/Throat: Oropharynx is clear and moist.  Neck: Normal range of motion. Neck supple.  Cardiovascular: Tachycardic rate and regular rhythm.   Pulmonary/Chest: Effort increased. Coarse breath sounds, slightly diminished over the right lung Abdominal: Soft. There is no tenderness. There is no rebound and no guarding.  Musculoskeletal: No lower extremity edema. No deformities.  Neurological: Alert, will nod or say yes or no to simple answers, moves all extremities symmetrically to command such as hand gripping, ankle dorsi flexion/plantar flexion Skin: Skin is warm and dry.  Psychiatric: Cooperative   ED Treatments / Results  Labs (all labs ordered are listed, but only abnormal results are displayed) Labs Reviewed  CBC WITH DIFFERENTIAL/PLATELET - Abnormal; Notable for the following:  Result Value   WBC 12.9 (*)    RBC 3.97 (*)    Hemoglobin 10.5 (*)    HCT 33.3 (*)    All other components within normal limits  COMPREHENSIVE METABOLIC PANEL - Abnormal; Notable for the following:    Glucose, Bld 186 (*)    BUN 5 (*)    Calcium 7.5 (*)    Total Protein 6.1 (*)    Albumin 1.3 (*)    AST 47 (*)    ALT 14 (*)    All other components within normal limits  BRAIN NATRIURETIC PEPTIDE - Abnormal; Notable for the  following:    B Natriuretic Peptide 640.2 (*)    All other components within normal limits  I-STAT CG4 LACTIC ACID, ED - Abnormal; Notable for the following:    Lactic Acid, Venous 5.59 (*)    All other components within normal limits  I-STAT ARTERIAL BLOOD GAS, ED - Abnormal; Notable for the following:    pO2, Arterial 57.0 (*)    All other components within normal limits  CULTURE, BLOOD (ROUTINE X 2)  CULTURE, BLOOD (ROUTINE X 2)  URINE CULTURE  URINALYSIS, ROUTINE W REFLEX MICROSCOPIC  BLOOD GAS, ARTERIAL  I-STAT TROPONIN, ED    EKG  EKG Interpretation  Date/Time:  Friday January 21 2017 19:22:20 EDT Ventricular Rate:  130 PR Interval:    QRS Duration: 105 QT Interval:  276 QTC Calculation: 406 R Axis:   -4 Text Interpretation:  Sinus tachycardia LVH with secondary repolarization abnormality Probable anterior infarct, age indeterminate Baseline wander in lead(s) V2 Confirmed by Crista CurbLiu, Nhung Danko [phone]) on 01/31/2017 9:16:25 PM       Radiology Dg Chest Portable 1 View  Result Date: 02/05/2017 CLINICAL DATA:  Shortness of breath.  History of empyema. EXAM: PORTABLE CHEST 1 VIEW COMPARISON:  PA and lateral chest 02/11/2017 and 01/18/2017. CT chest 01/16/2017. FINDINGS: Pleural effusion which appears loculated along the right chest wall is again seen. Right basilar effusion is new since the most recent examination. There is new airspace disease throughout the right chest. The left lung is clear. Heart size is normal. IMPRESSION: New airspace disease throughout the right chest with a new right basilar effusion worrisome for pneumonia. Loculated right pleural effusion does not appear notably changed since the most recent exam. Electronically Signed   By: Drusilla Kannerhomas  Dalessio M.D.   On: 02/11/2017 20:03    Procedures Procedures (including critical care time) CRITICAL CARE Performed by: Lavera Guiseana Duo Deandre Brannan   Total critical care time: 40 minutes  Critical care time was exclusive of separately  billable procedures and treating other patients.  Critical care was necessary to treat or prevent imminent or life-threatening deterioration.  Critical care was time spent personally by me on the following activities: development of treatment plan with patient and/or surrogate as well as nursing, discussions with consultants, evaluation of patient's response to treatment, examination of patient, obtaining history from patient or surrogate, ordering and performing treatments and interventions, ordering and review of laboratory studies, ordering and review of radiographic studies, pulse oximetry and re-evaluation of patient's condition.  Medications Ordered in ED Medications  vancomycin (VANCOCIN) IVPB 1000 mg/200 mL premix (1,000 mg Intravenous New Bag/Given 02/01/2017 2033)  sodium chloride 0.9 % bolus 1,000 mL (1,000 mLs Intravenous New Bag/Given 02/04/2017 2034)    And  sodium chloride 0.9 % bolus 1,000 mL (1,000 mLs Intravenous New Bag/Given 02/09/2017 2034)  piperacillin-tazobactam (ZOSYN) IVPB 3.375 g (3.375 g Intravenous New Bag/Given 02/09/2017 2033)  Initial Impression / Assessment and Plan / ED Course  I have reviewed the triage vital signs and the nursing notes.  Pertinent labs & imaging results that were available during my care of the patient were reviewed by me and considered in my medical decision making (see chart for details).    Records reviewed. Patient just discharged today after prolonged hospitalization for empyema over the right lung. The patient also had concerning mass that could be causing right airway obstruction.  In the ED, he is alert, obeys simple commands, but not very responsive and answering questions. Grossly neuro intact. He is continued on BiPAP in the emergency department, with improved work of breathing.  Repeat x-ray shows near opacified right lung, concerning for new consolidation. He does have an elevated lactate of 6, and mild leukocytosis of 12. Code sepsis is  activated, and he is started empirically on vancomycin and Zosyn for likely HCAP. Received 2 L of IV fluids per sepsis protocol.  Discussed with family medicine service who will readmit for ongoing management.  Final Clinical Impressions(s) / ED Diagnoses   Final diagnoses:  Acute respiratory failure with hypoxia (HCC)  HCAP (healthcare-associated pneumonia)    New Prescriptions New Prescriptions   No medications on file     Lavera GuiseLiu, Daneli Butkiewicz Duo, MD 01/27/2017 2118

## 2017-01-21 NOTE — ED Triage Notes (Signed)
Pt comes via [facility] for resp distess from [facility], left today from here, was admitted with r sided chest tube, pt with stats in the 80% on CPAP, SOB after eating, rales on R side. PTA received 5mg  of albuterol, one nitro, 125 solumedrol

## 2017-01-21 NOTE — Clinical Social Work Placement (Signed)
Pt is ready for discharge and [facility]. CSW [facility] and [facility]. CSW arranged transportation with [facility]. [facility] and RN will call report to [phone]. CSW updated nephew [place] ServiceMelvin Zobrist. CSW signing off as no further Social Work needs identified.   CLINICAL SOCIAL WORK PLACEMENT  NOTE  Date:  02/18/2017  Patient Details  Name: Jay HibbsJessie Patriarca MRN: [id number] Date of Birth: 02/05/1946  Clinical Social Work is seeking post-discharge placement for this patient at the Skilled  Nursing Facility level of care (*CSW will initial, date and re-position this form in  chart as items are completed):  Yes   Patient/family provided with [place] Clinical Social Work Department's list of facilities offering this level of care within the geographic area requested by the patient (or if unable, by the patient's family).  Yes   Patient/family informed of their freedom to choose among providers that offer the needed level of care, that participate in Medicare, Medicaid or managed care program needed by the patient, have an available bed and are willing to accept the patient.  Yes   Patient/family informed of [facility]'s ownership interest in [facility] and [facility], as well as of the fact that they are under no obligation to receive care at these facilities.  PASRR submitted to EDS on       PASRR number received on 01/17/17     Existing PASRR number confirmed on 01/17/17     FL2 transmitted to all facilities in geographic area requested by pt/family on 01/17/17     FL2 transmitted to all facilities within larger geographic area on       Patient informed that his/her managed care company has contracts with or will negotiate with certain facilities, including the following:        Yes   Patient/family informed of bed offers received.  [facility]     Physician  recommends and patient chooses bed at      [facility].  Patient to be transferred to facility by [facility]     Patient family notified on 01/24/2017 of transfer.  Name of family member notified:  Susette RacerMelvin Richens-Nephew [phone]     PHYSICIAN Please prepare prescriptions, Please prepare priority discharge summary, including medications     Additional Comment:    _______________________________________________ Dominic PeaJeneya G Maxine Fredman, LCSW 01/19/2017, 3:46 PM

## 2017-01-21 NOTE — ED Notes (Signed)
Both sets of blood cultures are at bedside til order put in.

## 2017-01-21 NOTE — Progress Notes (Signed)
Jay HibbsJessie Stephens to be D/C'd Skilled nursing facility per MD order.  Discussed with the patient and all questions fully answered.  VSS, Skin clean, dry and intact, wound dressing was changed before pt was transported to SNF. IV catheter discontinued intact. Site without signs and symptoms of complications. Dressing and pressure applied.  An After Visit Summary was printed and given to  [name] the transporter.  D/c education completed with patient/family including follow up instructions, medication list, d/c activities limitations if indicated, with other d/c instructions as indicated by MD - patient able to verbalize understanding, all questions fully answered.   [facility] and D[facility]C to SNF Via ambulance  [name] 02/06/2017 7:35 PM

## 2017-01-21 NOTE — Progress Notes (Signed)
Physical Therapy Treatment Patient Details Name: Jay Stephens MRN: [id number] DOB: 02/13/1946 Today's Date: 01/20/2017    History of Present Illness 71 year old admitted with loculated hydropneumothorax, altered mental status,noted to have possible right facial droop and right-sided weakness; neuro workup underway. Noted R side hydropneumothorax    PT Comments    Pt  Cooperative and willing to work with PT; responds appropriately but is oriented only to self; will continue to follow  Follow Up Recommendations  SNF;Supervision/Assistance - 24 hour     Equipment Recommendations  None recommended by PT    Recommendations for Other Services       Precautions / Restrictions Precautions Precautions: Fall Precaution Comments: right chest tube site/dressing in place; incontinent Restrictions Weight Bearing Restrictions: No    Mobility  Bed Mobility Overal bed mobility: Needs Assistance Bed Mobility: Rolling;Sit to Sidelying;Sidelying to Sit Rolling: Min assist (right and left) Sidelying to sit: Mod assist     Sit to sidelying: Mod assist General bed mobility comments: increased time and effort, multi modal cues to direct to task of bringing LEs to EOB. Assist to elevate trunk to upright, bed pad utilized to assist pt with scooting to EOB; Assist to elevate LEs back to bed, cues to position trunk and UEs in s/l and to self assist  Transfers                 General transfer comment:  (N/T with +1 assist)  Ambulation/Gait                 Stairs            Wheelchair Mobility    Modified Rankin (Stroke Patients Only)       Balance   Sitting-balance support: Feet supported;Bilateral upper extremity supported Sitting balance-Leahy Scale: Fair Sitting balance - Comments: fair to poor; pt is able to maintain midline for ~15sec without assist; requires cues for midline; worked on trunk rotation and lateral wt shifting in sitting with min assist  needed for  balance/facilitation Postural control: Left lateral lean                                  Cognition Arousal/Alertness: Awake/alert Behavior During Therapy: Flat affect;WFL for tasks assessed/performed Overall Cognitive Status: No family/caregiver present to determine baseline cognitive functioning                   Orientation Level: Disoriented to;Place;Time;Situation Current Attention Level: Sustained   Following Commands: Follows one step commands consistently;Follows one step commands with increased time;Follows multi-step commands inconsistently Safety/Judgement: Decreased awareness of safety;Decreased awareness of deficits   Problem Solving: Slow processing;Decreased initiation;Difficulty sequencing;Requires verbal cues;Requires tactile cues        Exercises General Exercises - Lower Extremity Long Arc Quad: AROM;Strengthening;Both;10 reps;Seated Heel Slides: AROM;AAROM;Both;5 reps    General Comments        Pertinent Vitals/Pain Pain Assessment: No/denies pain    Home Living                      Prior Function            PT Goals (current goals can now be found in the care plan section) Acute Rehab PT Goals Patient Stated Goal: none stated PT Goal Formulation: Patient unable to participate in goal setting Time For Goal Achievement: 01/29/17 Potential to Achieve Goals: Fair Progress towards PT goals: Progressing toward goals  Frequency    Min 2X/week      PT Plan Current plan remains appropriate    Co-evaluation              AM-PAC PT "6 Clicks" Daily Activity  Outcome Measure  Difficulty turning over in bed (including adjusting bedclothes, sheets and blankets)?: Total Difficulty moving from lying on back to sitting on the side of the bed? : Total Difficulty sitting down on and standing up from a chair with arms (e.g., wheelchair, bedside commode, etc,.)?: Total Help needed moving to and from a bed to chair  (including a wheelchair)?: Total Help needed walking in hospital room?: Total Help needed climbing 3-5 steps with a railing? : Total 6 Click Score: 6    End of Session   Activity Tolerance: Patient tolerated treatment well Patient left: in bed;with call bell/phone within reach;with bed alarm set Nurse Communication: Mobility status PT Visit Diagnosis: Unsteadiness on feet (R26.81);Muscle weakness (generalized) (M62.81);Adult, failure to thrive (R62.7)     Time: [id number] PT Time Calculation (min) (ACUTE ONLY): 19 min  Charges:  $Therapeutic Activity: 8-22 mins                    G CodesDrucilla Stephens:        Jay Stephens, PT Pager: [phone] 01/22/2017    Jay Stephens,Jay Stephens 02/05/2017, 12:27 PM

## 2017-01-21 NOTE — Progress Notes (Signed)
PT pulled off bipap mask, was no tolerating mask any longer. RT placed PT on  and will continue to monitor as needed.

## 2017-01-22 ENCOUNTER — Inpatient Hospital Stay (HOSPITAL_COMMUNITY): Payer: Medicare Other

## 2017-01-22 DIAGNOSIS — R401 Stupor: Secondary | ICD-10-CM | POA: Diagnosis present

## 2017-01-22 DIAGNOSIS — J869 Pyothorax without fistula: Secondary | ICD-10-CM

## 2017-01-22 DIAGNOSIS — J439 Emphysema, unspecified: Secondary | ICD-10-CM | POA: Diagnosis present

## 2017-01-22 DIAGNOSIS — R188 Other ascites: Secondary | ICD-10-CM | POA: Diagnosis present

## 2017-01-22 DIAGNOSIS — J690 Pneumonitis due to inhalation of food and vomit: Secondary | ICD-10-CM | POA: Diagnosis present

## 2017-01-22 DIAGNOSIS — I519 Heart disease, unspecified: Secondary | ICD-10-CM

## 2017-01-22 DIAGNOSIS — I700 Atherosclerosis of aorta: Secondary | ICD-10-CM | POA: Diagnosis present

## 2017-01-22 DIAGNOSIS — I5189 Other ill-defined heart diseases: Secondary | ICD-10-CM

## 2017-01-22 LAB — CBC
HCT: 28.6 % — ABNORMAL LOW (ref 39.0–52.0)
Hemoglobin: 8.9 g/dL — ABNORMAL LOW (ref 13.0–17.0)
MCH: 25.7 pg — AB (ref 26.0–34.0)
MCHC: 31.1 g/dL (ref 30.0–36.0)
MCV: 82.7 fL (ref 78.0–100.0)
PLATELETS: 150 10*3/uL (ref 150–400)
RBC: 3.46 MIL/uL — AB (ref 4.22–5.81)
RDW: 14.8 % (ref 11.5–15.5)
WBC: 11.2 10*3/uL — ABNORMAL HIGH (ref 4.0–10.5)

## 2017-01-22 LAB — PROTIME-INR
INR: 0.98
Prothrombin Time: 13 s (ref 11.4–15.2)

## 2017-01-22 LAB — I-STAT CG4 LACTIC ACID, ED: Lactic Acid, Venous: 1.51 mmol/L (ref 0.5–1.9)

## 2017-01-22 LAB — APTT: aPTT: 34 s (ref 24–36)

## 2017-01-22 LAB — LACTIC ACID, PLASMA: Lactic Acid, Venous: 1.5 mmol/L (ref 0.5–1.9)

## 2017-01-22 LAB — BASIC METABOLIC PANEL WITH GFR
Anion gap: 4 — ABNORMAL LOW (ref 5–15)
BUN: 7 mg/dL (ref 6–20)
CO2: 27 mmol/L (ref 22–32)
Calcium: 7.2 mg/dL — ABNORMAL LOW (ref 8.9–10.3)
Chloride: 105 mmol/L (ref 101–111)
Creatinine, Ser: 0.82 mg/dL (ref 0.61–1.24)
GFR calc Af Amer: >60 mL/min
GFR calc non Af Amer: >60 mL/min
Glucose, Bld: 257 mg/dL — ABNORMAL HIGH (ref 65–99)
Potassium: 4.5 mmol/L (ref 3.5–5.1)
Sodium: 136 mmol/L (ref 135–145)

## 2017-01-22 LAB — PROCALCITONIN: Procalcitonin: 2.9 ng/mL

## 2017-01-22 LAB — MRSA PCR SCREENING: MRSA by PCR: NEGATIVE

## 2017-01-22 MED ORDER — DORNASE ALFA 2.5 MG/2.5ML IN SOLN
5.0000 mg | Freq: Two times a day (BID) | RESPIRATORY_TRACT | Status: DC
  Administered 2017-01-23: 5 mg via INTRAPLEURAL
  Filled 2017-01-22 (×3): qty 5

## 2017-01-22 MED ORDER — FENTANYL CITRATE (PF) 100 MCG/2ML IJ SOLN
25.0000 ug | INTRAMUSCULAR | Status: DC | PRN
  Administered 2017-01-22: 50 ug via INTRAVENOUS

## 2017-01-22 MED ORDER — FUROSEMIDE 10 MG/ML IJ SOLN
20.0000 mg | Freq: Once | INTRAMUSCULAR | Status: AC
  Administered 2017-01-22: 20 mg via INTRAVENOUS
  Filled 2017-01-22: qty 2

## 2017-01-22 MED ORDER — SODIUM CHLORIDE 0.9 % IJ SOLN
10.0000 mg | Freq: Two times a day (BID) | INTRAMUSCULAR | Status: DC
  Administered 2017-01-23: 10 mg via INTRAPLEURAL
  Filled 2017-01-22 (×3): qty 10

## 2017-01-22 MED ORDER — STERILE WATER FOR INJECTION IJ SOLN
5.0000 mg | Freq: Two times a day (BID) | RESPIRATORY_TRACT | Status: DC
  Filled 2017-01-22: qty 5

## 2017-01-22 MED ORDER — ENOXAPARIN SODIUM 40 MG/0.4ML ~~LOC~~ SOLN
40.0000 mg | Freq: Every day | SUBCUTANEOUS | Status: DC
  Administered 2017-01-22 – 2017-01-23 (×2): 40 mg via SUBCUTANEOUS
  Filled 2017-01-22 (×2): qty 0.4

## 2017-01-22 MED ORDER — RESOURCE THICKENUP CLEAR PO POWD
ORAL | Status: DC | PRN
  Filled 2017-01-22: qty 125

## 2017-01-22 MED ORDER — IOPAMIDOL (ISOVUE-300) INJECTION 61%
INTRAVENOUS | Status: AC
  Administered 2017-01-22: 75 mL
  Filled 2017-01-22: qty 75

## 2017-01-22 MED ORDER — PIPERACILLIN-TAZOBACTAM 3.375 G IVPB
3.3750 g | Freq: Three times a day (TID) | INTRAVENOUS | Status: DC
  Administered 2017-01-22 – 2017-02-03 (×36): 3.375 g via INTRAVENOUS
  Filled 2017-01-22 (×41): qty 50

## 2017-01-22 MED ORDER — TIOTROPIUM BROMIDE MONOHYDRATE 18 MCG IN CAPS
18.0000 ug | ORAL_CAPSULE | Freq: Every day | RESPIRATORY_TRACT | Status: DC
  Administered 2017-01-23: 18 ug via RESPIRATORY_TRACT
  Filled 2017-01-22: qty 5

## 2017-01-22 MED ORDER — FENTANYL CITRATE (PF) 100 MCG/2ML IJ SOLN
INTRAMUSCULAR | Status: AC
  Filled 2017-01-22: qty 2

## 2017-01-22 MED ORDER — VANCOMYCIN HCL IN DEXTROSE 750-5 MG/150ML-% IV SOLN
750.0000 mg | Freq: Two times a day (BID) | INTRAVENOUS | Status: DC
  Administered 2017-01-22 – 2017-01-24 (×6): 750 mg via INTRAVENOUS
  Filled 2017-01-22 (×7): qty 150

## 2017-01-22 MED ORDER — LIDOCAINE HCL (PF) 1 % IJ SOLN
INTRAMUSCULAR | Status: AC
  Administered 2017-01-22: 30 mL
  Filled 2017-01-22: qty 30

## 2017-01-22 MED ORDER — DORNASE ALFA 2.5 MG/2.5ML IN SOLN
5.0000 mg | Freq: Two times a day (BID) | RESPIRATORY_TRACT | Status: DC
  Administered 2017-01-22: 5 mg via INTRAPLEURAL
  Filled 2017-01-22 (×2): qty 5

## 2017-01-22 MED ORDER — SODIUM CHLORIDE 0.9 % IJ SOLN
10.0000 mg | Freq: Two times a day (BID) | INTRAMUSCULAR | Status: DC
  Administered 2017-01-22: 10 mg via INTRAPLEURAL
  Filled 2017-01-22 (×2): qty 10

## 2017-01-22 MED ORDER — SODIUM CHLORIDE 0.9 % IJ SOLN
10.0000 mg | Freq: Two times a day (BID) | INTRAMUSCULAR | Status: DC
  Filled 2017-01-22: qty 10

## 2017-01-22 NOTE — Progress Notes (Signed)
Pharmacy Antibiotic Note  Jay HibbsJessie Stephens is a 71 y.o. male admitted on 02/03/2017 with pneumonia/empyema.  Pharmacy has been consulted for Vancomycin/Zosyn dosing. Pt was [facility] on PO Augmentin. Presents back to ED from nursing facility with respiratory distress. WBC elevated. Renal function good.   Plan: Vancomycin 750 mg IV q12h Zosyn 3.375G IV q8h to be infused over 4 hours Trend WBC, temp, renal function  F/U infectious work-up Drug levels as indicated  Temp (24hrs), Avg:97.8 F (36.6 C), Min:97 F (36.1 C), Max:98.9 F (37.2 C)   Recent Labs Lab 01/18/17 0346 01/26/2017 0353 01/20/17 0405 01/30/2017 0342 01/27/2017 0441 02/12/2017 1937 02/12/2017 1939 01/22/17 0002 01/22/17 0005  WBC 4.5 5.0 5.2  --  5.0 12.9*  --   --   --   CREATININE 0.78 0.75 0.72 0.72  --  0.82  --   --   --   LATICACIDVEN  --   --   --   --   --   --  5.59* 1.5 1.51    Estimated Creatinine Clearance: 74.8 mL/min (by C-G formula based on SCr of 0.82 mg/dL).    No Known Allergies   Jay Stephens, Jay Stephens 01/22/2017 2:10 AM

## 2017-01-22 NOTE — Evaluation (Signed)
Clinical/Bedside Swallow Evaluation Patient Details  Name: Jay HibbsJessie Stephens MRN: [id number] Date of Birth: 05/16/1946  Today's Date: 01/22/2017 Time: SLP Start Time (ACUTE ONLY): 1610 SLP Stop Time (ACUTE ONLY): 1625 SLP Time Calculation (min) (ACUTE ONLY): 15 min  Past Medical History: History reviewed. No pertinent past medical history. Past Surgical History:  Past Surgical History:  Procedure Laterality Date  . DENTAL RESTORATION/EXTRACTION WITH X-RAY N/A 01/24/2017   Procedure: SURGICAL EXCTRACTIONS OF ABSCESSED TOOTH/TEETH;  Surgeon: Vivia Ewingrab, Justin, DMD;  Location: [facility] OR;  Service: Oral Surgery;  Laterality: N/A;  . INCISION AND DRAINAGE ABSCESS N/A 02/11/2017   Procedure: INCISION AND DRAINAGE ABSCESS;  Surgeon: Vivia Ewingrab, Justin, DMD;  Location: [facility] OR;  Service: Oral Surgery;  Laterality: N/A;   HPI:  [name] is a 71yo male discharged yesterday and readmitted same day for oxygen desaturation while eating at SNF into 70's.  Patient is s/p chest tube for large R empyema, I/D for dental abscess and stage 2 sacral ulcer, AMS. Recent admission [facility] 7/23 for AMS, R facial droop, and generalized weakness. CT head was negative for stroke but with area of encephalomalacia/gliosis in R temporal lobe. CT chest with large loculated hydropneumothorax on R chest and nonvisualization of RUL bronchus. Intubated 7/24-7/27. CXR 7/28 showed diffuse right lung infiltrate. MBS 01/16/17 findings of moderate oropharyngeal dysphagia impacted by sensorimotor function post-intubation and cognitive status, silent aspiration of thin liquids. Pt tolerated nectar-thick liquids by tsp without penetration or aspiration and was recommended to consume dys 1 diet with nectar thick liquids by teaspoon only with full supervision to assist with rate of intake, small bolus size as pt impulsive with straw and cup sips.    Assessment / Plan / Recommendation Clinical Impression  Pt presents with swallowing function which appears consistent with  function observed prior to discharge yesterday. He tolerates teaspoons of nectar-thick liquids and pureed solids with no overt signs of aspiration. When presented with cup, pt does not follow cues for small sips of nectar-thick liquid, presenting with immediate throat clearing, suggestive of reduced airway protection. Recommend initiating dys 1 diet with nectar thick liquids via teaspoon only, with full supervision and assistance for small bolus size, slow rate of intake. SLP to follow for tolerance; will consider repeating instrumental assessment prior to advancement of solids, liquids. SLP Visit Diagnosis: Dysphagia, unspecified (R13.10)    Aspiration Risk  Moderate aspiration risk    Diet Recommendation Dysphagia 1 (Puree);Nectar-thick liquid   Liquid Administration via: Spoon;No straw (teaspoon only) Medication Administration: Crushed with puree Supervision: Full supervision/cueing for compensatory strategies;Staff to assist with self feeding Compensations: Minimize environmental distractions;Slow rate;Small sips/bites    Other  Recommendations Oral Care Recommendations: Oral care BID Other Recommendations: Order thickener from pharmacy;Remove water pitcher;Clarify dietary restrictions   Follow up Recommendations Skilled Nursing facility      Frequency and Duration min 2x/week  2 weeks       Prognosis Prognosis for Safe Diet Advancement: Good Barriers to Reach Goals: Cognitive deficits      Swallow Study   General Date of Onset: 02/15/2017 HPI: [name] is a 71yo male discharged yesterday and readmitted same day for oxygen desaturation while eating at SNF into 70's.  Patient is s/p chest tube for large R empyema, I/D for dental abscess and stage 2 sacral ulcer, AMS. Recent admission [facility] 7/23 for AMS, R facial droop, and generalized weakness. CT head was negative for stroke but with area of encephalomalacia/gliosis in R temporal lobe. CT chest with large loculated hydropneumothorax  on R chest and nonvisualization of RUL bronchus. Intubated 7/24-7/27. CXR 7/28 showed diffuse right lung infiltrate. MBS 01/16/17 findings of moderate oropharyngeal dysphagia impacted by sensorimotor function post-intubation and cognitive status, silent aspiration of thin liquids. Pt tolerated nectar-thick liquids by tsp without penetration or aspiration and was recommended to consume dys 1 diet with nectar thick liquids by teaspoon only with full supervision to assist with rate of intake, small bolus size as pt impulsive with straw and cup sips.  Type of Study: Bedside Swallow Evaluation Previous Swallow Assessment: see HPI Diet Prior to this Study: NPO Temperature Spikes Noted: No Respiratory Status: Nasal cannula History of Recent Intubation: Yes Length of Intubations (days): 4 days Date extubated: 01/14/17 Behavior/Cognition: Alert;Cooperative;Confused Oral Cavity Assessment: Within Functional Limits Oral Care Completed by SLP: No Oral Cavity - Dentition: Edentulous Vision: Functional for self-feeding Self-Feeding Abilities: Needs assist;Other (Comment) (impulsive) Patient Positioning: Upright in bed Baseline Vocal Quality: Low vocal intensity;Hoarse Volitional Cough: Weak Volitional Swallow: Able to elicit    Oral/Motor/Sensory Function Overall Oral Motor/Sensory Function: Within functional limits   Ice Chips Ice chips: Impaired Presentation: Spoon Pharyngeal Phase Impairments: Cough - Immediate   Thin Liquid Thin Liquid: Not tested    Nectar Thick Nectar Thick Liquid: Impaired Pharyngeal Phase Impairments: Throat Clearing - Immediate;Other (comments) (throat clearing with cup sips; teaspoons WFL)   Honey Thick Honey Thick Liquid: Not tested   Puree Puree: Within functional limits Presentation: Spoon   Solid   GO   Solid: Not tested       Rondel BatonMary Beth Rolande Moe, MS, CCC-SLP Speech-Language Pathologist [phone]  Arlana LindauMary E Meshach Perry 01/22/2017,4:37 PM

## 2017-01-22 NOTE — ED Notes (Signed)
Attempted report 

## 2017-01-22 NOTE — Progress Notes (Signed)
RT received report in regards to pt. Ripping his bipap mask off. RT spoke to pt. And pt. Refused to wear the mask. Pt. States that the mask bothered him. Pt. Currently in no distress, sats at 100%. RT to monitor.

## 2017-01-22 NOTE — Consult Note (Signed)
Name: Jay HibbsJessie Stephens MRN: [id number] DOB: 02/22/1946    ADMISSION DATE:  01/20/2017 CONSULTATION DATE:  01/22/17  REFERRING MD :  Dr Dorris FetchHendrickson, TCTS  Reason for Consultation: Complicated R pleural space, loculated effusion.   SIGNIFICANT EVENTS  R empyema (largely) drained with chest tube 7/23 - 7/30, S viridans  D/c to SNF on 8/3 Readmitted with hypoxemia and AMS, 8/3 evening. Found to have evolving multiple pockets pleural fluid  STUDIES:  CT chest 8/4 >> multiple large fluid collections with probable loculations, possibly contiguous, with some air pockets. Also moderate layering L effusion.   HISTORY OF PRESENT ILLNESS:  71 yo debilitated man w hx malnutrition, HTN and diastolic dysfxn, recent aspiration PNA c/b S viridans R empyema. This was drained by chest tube with improvement but some residual fluid pockets of fluid by CT chest 01/16/17. He was discharged to SNF on 8/3 but returned later the same day with encephalopathy, dyspnea, hypoxemia. Treated with broad spectrum Abx and transiently required BiPAP. Repeat CT scan 8/4 shows multiple large fluid collections largest posterior R chest, other possibly contiguous collections. TCTS and PCCM consulted to help w management.   PAST MEDICAL HISTORY :   has no past medical history on file.  has a past surgical history that includes Dental restoration/extraction with x-ray (N/A, 01/30/2017) and Incision and drainage abscess (N/A, 01/30/2017). Prior to Admission medications   Medication Sig Start Date End Date Taking? Authorizing Provider  amoxicillin-clavulanate (AUGMENTIN) 875-125 MG tablet Take 1 tablet by mouth 2 (two) times daily. 01/25/2017 01/28/17 Yes Howard PouchFeng, Lauren, MD   No Known Allergies  FAMILY HISTORY:  family history is not on file. SOCIAL HISTORY:  reports that he has been smoking.  He has a 7.50 pack-year smoking history. He uses smokeless tobacco.  REVIEW OF SYSTEMS:   Constitutional: Negative for fever, chills, weight loss,  malaise/fatigue and diaphoresis.  HENT: Negative for hearing loss, ear pain, nosebleeds, congestion, sore throat, neck pain, tinnitus and ear discharge.   Eyes: Negative for blurred vision, double vision, photophobia, pain, discharge and redness.  Respiratory: Negative for cough, hemoptysis, sputum production, shortness of breath, wheezing and stridor.   Cardiovascular: Negative for chest pain, palpitations, orthopnea, claudication, leg swelling and PND.  Gastrointestinal: Negative for heartburn, nausea, vomiting, abdominal pain, diarrhea, constipation, blood in stool and melena.  Genitourinary: Negative for dysuria, urgency, frequency, hematuria and flank pain.  Musculoskeletal: Negative for myalgias, back pain, joint pain and falls.  Skin: Negative for itching and rash.  Neurological: Negative for dizziness, tingling, tremors, sensory change, speech change, focal weakness, seizures, loss of consciousness, weakness and headaches.  Endo/Heme/Allergies: Negative for environmental allergies and polydipsia. Does not bruise/bleed easily.  SUBJECTIVE:   VITAL SIGNS: Temp:  [97 F (36.1 C)-97.8 F (36.6 C)] 97.6 F (36.4 C) (08/04 1113) Pulse Rate:  [74-134] 88 (08/04 1113) Resp:  [12-38] 17 (08/04 1113) BP: (121-186)/(77-116) 131/88 (08/04 0320) SpO2:  [80 %-100 %] 97 % (08/04 1113) FiO2 (%):  [60 %-100 %] 60 % (08/03 2330) Weight:  [64 kg (141 lb 1.5 oz)] 64 kg (141 lb 1.5 oz) (08/04 0200)  PHYSICAL EXAMINATION: General:  Cachectic man, laying comfortably, NAD Neuro:  Awake and alert, poorly oriented, unable to fully answer simple questions, ? Some degree dementia, moves all ext HEENT:  Op clear, poor dentition Cardiovascular:  Regular, no M Lungs:  Coarse on R, L clear, R chest tube in place without any wall suction Abdomen:  Soft, benign Musculoskeletal:  Thin, no deformity Skin:  No rashes   Recent Labs Lab 01/20/2017 0342 01/30/2017 1937 01/22/17 0350  NA 135 136 136  K 4.5 4.5  4.5  CL 106 101 105  CO2 23 23 27   BUN 5* 5* 7  CREATININE 0.72 0.82 0.82  GLUCOSE 152* 186* 257*    Recent Labs Lab 01/23/2017 0441 01/28/2017 1937 01/22/17 0350  HGB 9.9* 10.5* 8.9*  HCT 31.9* 33.3* 28.6*  WBC 5.0 12.9* 11.2*  PLT 148* 150 150   Ct Chest W Contrast  Result Date: 01/22/2017 CLINICAL DATA:  Status post chest tube for right empyema. EXAM: CT CHEST WITH CONTRAST TECHNIQUE: Multidetector CT imaging of the chest was performed during intravenous contrast administration. CONTRAST:  75mL ISOVUE-300 IOPAMIDOL (ISOVUE-300) INJECTION 61% COMPARISON:  Chest CT dated 01/16/2017. Chest x-ray from earlier same day. FINDINGS: Cardiovascular: Heart size is normal. No pericardial effusion. Coronary artery calcifications. No thoracic aortic aneurysm or dissection. Mediastinum/Nodes: Scattered small and moderate-sized lymph nodes within the mediastinum and perihilar regions, likely reactive in nature. Trachea and central bronchi are unremarkable. Lungs/Pleura: Large complex collections are seen throughout the right chest, major components probably contiguous but also likely multiloculated to some degree. Dominant collection of fluid and air along the posterior aspects of the right upper lobe and right lower lobe measures over 23 cm craniocaudal dimension (series 7, image 66) and approximately 12 x 7 cm transverse by AP dimensions near the right lung base (series 7, image 61; series 3, image 126). The posterior collection appears contiguous with an additional large complex collection of fluid and air at the right lung apex (probable connection seen on series 3, image 52). The complex collection in the right lung apex measures 14 cm craniocaudal dimension and 5 cm thickness (series 7, image 54; series 3, image 63). Lastly, additional complex fluid collection is seen along the right lateral chest wall measuring 9 cm AP dimension and 2.5 cm thickness (series 3, image 80). This collection does appear  contiguous with the collection at the right lung apex. Moderate-size layering pleural effusion on the left with adjacent compressive atelectasis. Upper Abdomen: Free fluid/ascites within the upper abdomen, at least a moderate amount, incompletely imaged. Musculoskeletal: No acute or suspicious osseous finding. Right-sided chest tube seen on CT of 01/16/2017 has been removed. IMPRESSION: 1. Large complex collections of fluid and air throughout the right chest, locations and measurements given above, largest collection along the posterior right chest measuring over 20 cm greatest dimension, presumed empyemas. These findings represent a significant worsening compared to the earlier chest CT of 01/16/2017. These collections appear to be contiguous but are also likely multiloculated to some degree. Right-sided chest tube has been removed in the interval. 2. Moderate-sized left pleural effusion. 3. Mediastinal and perihilar lymphadenopathy is likely reactive in nature. 4. Upper abdominal ascites, incompletely imaged. These results were called by telephone at the time of interpretation on 01/22/2017 at 11:00 am to Dr. Acquanetta BellingDD MCDIARMID , who verbally acknowledged these results. Electronically Signed   By: Bary RichardStan  Maynard M.D.   On: 01/22/2017 11:17   Dg Chest Port 1 View  Result Date: 01/22/2017 CLINICAL DATA:  Empyema.  Status post chest tube placement. EXAM: PORTABLE CHEST 1 VIEW COMPARISON:  CT chest 0 8/0 scratch the CT chest earlier today. Single-view of the chest 02/17/2017. FINDINGS: A new right chest tube is in place. Loculated pleural fluid collections in the right chest do not appear changed. Airspace disease in the right lung is also not notably changed. The left lung is  expanded and clear. Left pleural effusion is noted. IMPRESSION: No marked change in loculated right pleural effusions and airspace disease with a new chest tube in place. Negative for pneumothorax. Small left pleural effusion seen on prior CT scan  is not well demonstrated on this exam. Electronically Signed   By: Drusilla Kanner M.D.   On: 01/22/2017 15:51   Dg Chest Portable 1 View  Result Date: 02/14/2017 CLINICAL DATA:  Shortness of breath.  History of empyema. EXAM: PORTABLE CHEST 1 VIEW COMPARISON:  PA and lateral chest 02/09/2017 and 01/18/2017. CT chest 01/16/2017. FINDINGS: Pleural effusion which appears loculated along the right chest wall is again seen. Right basilar effusion is new since the most recent examination. There is new airspace disease throughout the right chest. The left lung is clear. Heart size is normal. IMPRESSION: New airspace disease throughout the right chest with a new right basilar effusion worrisome for pneumonia. Loculated right pleural effusion does not appear notably changed since the most recent exam. Electronically Signed   By: Drusilla Kanner M.D.   On: 01/24/2017 20:03    ASSESSMENT / PLAN: Complicated R pleural space with probable loculated pleural effusions. Probably empyema.  Dysphagia and aspiration pneumonia Ascites of uncertain etiology Malnutrition  Sacral pressure sore  I agree with Dr Dorris Fetch that optimal treatment would be decortication, but in this gentleman his debilitation and co-morbid conditions makes him a poor candidate for such an invasive surgery. Would be conservative here, try to achieve clearance of the pleural fluid via chest tube with lytics. Dr Dorris Fetch has placed a chest tube today. Very little fluid was mobilized after placement. I will work on getting the lytics and DNAase to infuse into the pleural space. Agree with broad abx. Will send pleural fluid for culture when obtained.    Levy Pupa, MD, PhD 01/22/2017, 4:32 PM [facility] [phone] or if no answer [phone]

## 2017-01-22 NOTE — Procedures (Signed)
After obtaining verbal consent, right chest prepped and draped sterilely.  Local anesthesia with 20 ml 1% lidocaine. 50 mcg of fentanyl IV  28 F chest tube placed right anterior chest. No air or fluid evacuated.  Will obtain CXR.  I suspect collection is gelatinous. Will discuss trial of intrapleural thrombolytics with CCM  Farrel Guimond C. Dorris FetchHendrickson, MD [facility] [phone]

## 2017-01-22 NOTE — Progress Notes (Signed)
Family Medicine Teaching Service Daily Progress Note Intern Pager: [phone]  Patient name: Jay Stephens Medical record number: [id number] Date of birth: 12/04/1945 Age: 71 y.o. Gender: male  Primary Care Provider: System, Pcp Not In Consultants: N/A Code Status: FULL  Pt Overview and Major Events to Date:  Patient is s/p chest tube for large R empyema, I/D for dental abscess and stage 2 sacral ulcer, AMS..he readmits the same day as discharge to SNF for oxygen desaturation while eating at SNF into 70's.  Assessment and Plan: Patient is s/p chest tube for large R empyema, I/D for dental abscess and stage 2 sacral ulcer, AMS..he readmits the same day as discharge to SNF for oxygen desaturation while eating at SNF into 70's.  Resp distress:  empyema vs pneumothorax vs lung mass vs aspiration pneumonia.   On Admission Patient met SIRS criteria with lactic acid of 5.6, tachycardia to 134, tachypnea on CPAP was satting 80% with EMS.    -admit to step down -Chest CT 8/4 showed loculated accumulations in R lung, CVTS surgery consulted -CXR showed R sided effusions/consolidation -Chest tube wound slightly purulent, ccm paged to request go ahead for suture removal -daily dressing changes  -O2 nasal canula -started vanc/zosyn -f/u procalcitonin -consider Lasix w/ worsening respiratory status and increase BNP -continuous pulse ox -am EKG -trend lactic acid -am CBC, BMP -follow blood cultures -follow urine culture -NPO in case chest procedure is required after CT  Dental Absess s/p I/D: -sutures are absorbable -chlorhexadine mouthwash TID until 8/15  Sacral pressure ulcer -stage 2 -wound care consulted  Protein-calorie malnutrition: Patient is cachectic, which is likely due to a chronic illness such as cancer as well as his low function at baseline.  Nephew reported earlier that he has to be fed at home.  Albumin on admission is 1.3.  Seen by nutrition and palliative care during his  recent hospitalization.  -monitor Mag, Phos, K daily for refeeding syndrome -continue to follow nutrition and palliative care recs -will go to pureed with feeding assistance if no procedure eminent post chest CT  FEN/GI: NPO pending chest CT PPx: N/A  Disposition: in step down pending respiratory stabilization and CT chest results, SNF when medically cleared  Subjective:  Patient still talkative but not oriented, says he is not in pain and again only says he is hungry.    Objective: Temp:  [97 F (36.1 C)-98.9 F (37.2 C)] 97.7 F (36.5 C) (08/04 0806) Pulse Rate:  [74-134] 85 (08/04 0320) Resp:  [12-38] 12 (08/04 0320) BP: (118-186)/(72-116) 131/88 (08/04 0320) SpO2:  [80 %-100 %] 100 % (08/04 0320) FiO2 (%):  [60 %-100 %] 60 % (08/03 2330) Weight:  [141 lb 1.5 oz (64 kg)] 141 lb 1.5 oz (64 kg) (08/04 0200) Physical Exam: General: Frail, slow to respond and in pain when rolled to his side for wound inspection, NAD Eyes: EOMI ENTM: dry mucous membranes, 2L nasal canula Neck: supple, nontender Cardiovascular: RRR, no MRG, no JVD Respiratory: rhonchi (superior)/ diminished (inferior) sounds in right lung, left lung fields clear to auscultation. Right chest wound able to express some fluctuance, no erythema or drainage, mildly tender to palpation Gastrointestinal: +bowel sounds, nontender MSK: thin extremities, generalized weakness Derm: grade 2 sacral ulcer Neuro: alert, can answer yes or no questions but not faithfully relay comprehension of plan Psych: mood seems appropriate, unable to assess fully  Laboratory:  Recent Labs Lab 02/06/2017 0441 01/23/2017 1937 01/22/17 0350  WBC 5.0 12.9* 11.2*  HGB 9.9* 10.5* 8.9*  HCT 31.9* 33.3* 28.6*  PLT 148* 150 150    Recent Labs Lab 01/24/2017 0342 01/22/2017 1937 01/22/17 0350  NA 135 136 136  K 4.5 4.5 4.5  CL 106 101 105  CO2 23 23 27   BUN 5* 5* 7  CREATININE 0.72 0.82 0.82  CALCIUM 7.3* 7.5* 7.2*  PROT  --  6.1*  --    BILITOT  --  0.9  --   ALKPHOS  --  102  --   ALT  --  14*  --   AST  --  47*  --   GLUCOSE 152* 186* 257*    Lactic Acid   5.6  Imaging/Diagnostic Tests: Dg Chest Portable 1 View  Result Date: 01/20/2017 CLINICAL DATA:  Shortness of breath.  History of empyema. EXAM: PORTABLE CHEST 1 VIEW COMPARISON:  PA and lateral chest 01/20/2017 and 01/18/2017. CT chest 01/16/2017. FINDINGS: Pleural effusion which appears loculated along the right chest wall is again seen. Right basilar effusion is new since the most recent examination. There is new airspace disease throughout the right chest. The left lung is clear. Heart size is normal. IMPRESSION: New airspace disease throughout the right chest with a new right basilar effusion worrisome for pneumonia. Loculated right pleural effusion does not appear notably changed since the most recent exam. Electronically Signed   By: Inge Rise M.D.   On: 01/27/2017 20:03     Sherene Sires, DO 01/22/2017, 9:45 AM PGY-1, [place] Intern pager: [phone], text pages welcome

## 2017-01-22 NOTE — Consult Note (Signed)
Reason for Consult:Right empyema Referring Physician: Family Medicine  Jay Stephens is an 71 y.o. male.  HPI: 46 yo man readmitted last night with decreased O2 saturations.  Patient is cooperative and answersyes or no questions but unable to provide a history. He was admitted on 7/23 with altered mental status. Workup revealed a right hydropneumothorax. Pulmonary placed a chest tube and drained 3 liters of pleural fluid. Cultures grew strep viridans. He was treated initially with vanco and zosyn. Ct removed on 7/30. He was switched to ceftriaxone with a plan for 14 days of antibiotics. He was discharged to a SNF yesterday and readmitted through ED last night after being noted to have decreased O2 saturations.  He was treated with BIPAP, but is currently on 2L [place]. Started on vanco and zosyn again. He currently is lethargic ( I have no idea of his baseline). Not in distress.   History reviewed. No pertinent past medical history.  Past Surgical History:  Procedure Laterality Date  . DENTAL RESTORATION/EXTRACTION WITH X-RAY N/A 02/12/2017   Procedure: SURGICAL EXCTRACTIONS OF ABSCESSED TOOTH/TEETH;  Surgeon: Michael Litter, DMD;  Location: [facility];  Service: Oral Surgery;  Laterality: N/A;  . INCISION AND DRAINAGE ABSCESS N/A 02/05/2017   Procedure: INCISION AND DRAINAGE ABSCESS;  Surgeon: Michael Litter, DMD;  Location: [facility];  Service: Oral Surgery;  Laterality: N/A;    No family history on file.  Social History:  reports that he has been smoking.  He has a 7.50 pack-year smoking history. He uses smokeless tobacco. His alcohol and drug histories are not on file.  Allergies: No Known Allergies  Medications:  Scheduled: . enoxaparin (LOVENOX) injection  40 mg Subcutaneous Daily    Results for orders placed or performed during the hospital encounter of 02/13/2017 (from the past 48 hour(s))  CBC with Differential     Status: Abnormal   Collection Time: 01/31/2017  7:37 PM  Result Value Ref Range   WBC  12.9 (H) 4.0 - 10.5 K/uL   RBC 3.97 (L) 4.22 - 5.81 MIL/uL   Hemoglobin 10.5 (L) 13.0 - 17.0 g/dL   HCT 33.3 (L) 39.0 - 52.0 %   MCV 83.9 78.0 - 100.0 fL   MCH 26.4 26.0 - 34.0 pg   MCHC 31.5 30.0 - 36.0 g/dL   RDW 14.9 11.5 - 15.5 %   Platelets 150 150 - 400 K/uL   Neutrophils Relative %  %    QUANTITY NOT SUFFICIENT, UNABLE TO PERFORM TEST J.FERRANILLI,RN 01/20/2017 @ [address]   Neutro Abs  1.7 - 7.7 K/uL    QUANTITY NOT SUFFICIENT, UNABLE TO PERFORM TEST J.FERRANILLI,RN 02/15/2017 @ [address]   Band Neutrophils  %    QUANTITY NOT SUFFICIENT, UNABLE TO PERFORM TEST J.FERRANILLI,RN 02/17/2017 @ [address]   Lymphocytes Relative  %    QUANTITY NOT SUFFICIENT, UNABLE TO PERFORM TEST J.FERRANILLI,RN 01/27/2017 @ [address]   Lymphs Abs  0.7 - 4.0 K/uL    QUANTITY NOT SUFFICIENT, UNABLE TO PERFORM TEST J.FERRANILLI,RN 02/09/2017 @ [address]   Monocytes Relative  %    QUANTITY NOT SUFFICIENT, UNABLE TO PERFORM TEST J.FERRANILLI,RN 02/09/2017 @ [address]   Monocytes Absolute  0.1 - 1.0 K/uL    QUANTITY NOT SUFFICIENT, UNABLE TO PERFORM TEST J.FERRANILLI,RN 02/14/2017 @ [address]   Eosinophils Relative  %    QUANTITY NOT SUFFICIENT, UNABLE TO PERFORM TEST J.FERRANILLI,RN 01/25/2017 @ [address]   Eosinophils Absolute  0.0 - 0.7 K/uL    QUANTITY  NOT SUFFICIENT, UNABLE TO PERFORM TEST J.FERRANILLI,RN 02/06/2017 @ [address]   Basophils Relative  %    QUANTITY NOT SUFFICIENT, UNABLE TO PERFORM TEST J.FERRANILLI,RN 01/22/2017 @ [address]   Basophils Absolute  0.0 - 0.1 K/uL    QUANTITY NOT SUFFICIENT, UNABLE TO PERFORM TEST J.FERRANILLI,RN 02/04/2017 @ [address]   WBC Morphology      QUANTITY NOT SUFFICIENT, UNABLE TO PERFORM TEST J.FERRANILLI,RN 02/17/2017 @ [address]   RBC Morphology      QUANTITY NOT SUFFICIENT, UNABLE TO PERFORM TEST J.FERRANILLI,RN 02/08/2017 @ [address]   Smear Review      QUANTITY NOT SUFFICIENT, UNABLE TO PERFORM TEST  J.FERRANILLI,RN 01/29/2017 @ [address]   Other  %    QUANTITY NOT SUFFICIENT, UNABLE TO PERFORM TEST J.FERRANILLI,RN 02/17/2017 @ [address]   nRBC  0 /100 WBC    QUANTITY NOT SUFFICIENT, UNABLE TO PERFORM TEST J.FERRANILLI,RN 02/02/2017 @ [address]   Metamyelocytes Relative  %    QUANTITY NOT SUFFICIENT, UNABLE TO PERFORM TEST J.FERRANILLI,RN 01/20/2017 @ [address]   Myelocytes  %    QUANTITY NOT SUFFICIENT, UNABLE TO PERFORM TEST J.FERRANILLI,RN 02/05/2017 @ [address]   Promyelocytes Absolute  %    QUANTITY NOT SUFFICIENT, UNABLE TO PERFORM TEST J.FERRANILLI,RN 02/16/2017 @ [address]   Blasts  %    QUANTITY NOT SUFFICIENT, UNABLE TO PERFORM TEST J.FERRANILLI,RN 02/13/2017 @ [address]  Comprehensive metabolic panel     Status: Abnormal   Collection Time: 02/06/2017  7:37 PM  Result Value Ref Range   Sodium 136 135 - 145 mmol/L   Potassium 4.5 3.5 - 5.1 mmol/L   Chloride 101 101 - 111 mmol/L   CO2 23 22 - 32 mmol/L   Glucose, Bld 186 (H) 65 - 99 mg/dL   BUN 5 (L) 6 - 20 mg/dL   Creatinine, Ser 0.82 0.61 - 1.24 mg/dL   Calcium 7.5 (L) 8.9 - 10.3 mg/dL   Total Protein 6.1 (L) 6.5 - 8.1 g/dL   Albumin 1.3 (L) 3.5 - 5.0 g/dL   AST 47 (H) 15 - 41 U/L   ALT 14 (L) 17 - 63 U/L   Alkaline Phosphatase 102 38 - 126 U/L   Total Bilirubin 0.9 0.3 - 1.2 mg/dL   GFR calc non Af Amer >60 >60 mL/min   GFR calc Af Amer >60 >60 mL/min    Comment: (NOTE) The eGFR has been calculated using the CKD EPI equation. This calculation has not been validated in all clinical situations. eGFR's persistently <60 mL/min signify possible Chronic Kidney Disease.    Anion gap 12 5 - 15  Brain natriuretic peptide     Status: Abnormal   Collection Time: 01/25/2017  7:37 PM  Result Value Ref Range   B Natriuretic Peptide 640.2 (H) 0.0 - 100.0 pg/mL  I-Stat Troponin, ED (not at [facility])     Status: None   Collection Time: 01/30/2017  7:37 PM  Result Value Ref Range   Troponin i, poc 0.04 0.00  - 0.08 ng/mL   Comment 3            Comment: Due to the release kinetics of cTnI, a negative result within the first hours of the onset of symptoms does not rule out myocardial infarction with certainty. If myocardial infarction is still suspected, repeat the test at appropriate intervals.   I-Stat CG4 Lactic Acid, ED     Status: Abnormal   Collection Time: 02/10/2017  7:39 PM  Result Value Ref Range   Lactic Acid, Venous 5.59 (HH) 0.5 - 1.9 mmol/L   Comment NOTIFIED PHYSICIAN   I-Stat arterial blood gas, ED     Status: Abnormal   Collection Time: 01/20/2017  7:39 PM  Result Value Ref Range   pH, Arterial 7.375 7.350 - 7.450   pCO2 arterial 39.1 32.0 - 48.0 mmHg   pO2, Arterial 57.0 (L) 83.0 - 108.0 mmHg   Bicarbonate 22.9 20.0 - 28.0 mmol/L   TCO2 24 0 - 100 mmol/L   O2 Saturation 88.0 %   Acid-base deficit 2.0 0.0 - 2.0 mmol/L   Patient temperature 98.6 F    Collection site RADIAL, ALLEN'S TEST ACCEPTABLE    Drawn by RT    Sample type ARTERIAL   Urinalysis, Routine w reflex microscopic     Status: None   Collection Time: 02/13/2017  8:43 PM  Result Value Ref Range   Color, Urine YELLOW YELLOW   APPearance CLEAR CLEAR   Specific Gravity, Urine 1.006 1.005 - 1.030   pH 6.0 5.0 - 8.0   Glucose, UA NEGATIVE NEGATIVE mg/dL   Hgb urine dipstick NEGATIVE NEGATIVE   Bilirubin Urine NEGATIVE NEGATIVE   Ketones, ur NEGATIVE NEGATIVE mg/dL   Protein, ur NEGATIVE NEGATIVE mg/dL   Nitrite NEGATIVE NEGATIVE   Leukocytes, UA NEGATIVE NEGATIVE  Lactic acid, plasma     Status: None   Collection Time: 01/22/17 12:02 AM  Result Value Ref Range   Lactic Acid, Venous 1.5 0.5 - 1.9 mmol/L  I-Stat CG4 Lactic Acid, ED     Status: None   Collection Time: 01/22/17 12:05 AM  Result Value Ref Range   Lactic Acid, Venous 1.51 0.5 - 1.9 mmol/L  MRSA PCR Screening     Status: None   Collection Time: 01/22/17  2:06 AM  Result Value Ref Range   MRSA by PCR NEGATIVE NEGATIVE    Comment:        The  GeneXpert MRSA Assay (FDA approved for NASAL specimens only), is one component of a comprehensive MRSA colonization surveillance program. It is not intended to diagnose MRSA infection nor to guide or monitor treatment for MRSA infections.   Procalcitonin     Status: None   Collection Time: 01/22/17  3:50 AM  Result Value Ref Range   Procalcitonin 2.90 ng/mL    Comment:        Interpretation: PCT > 2 ng/mL: Systemic infection (sepsis) is likely, unless other causes are known. (NOTE)         ICU PCT Algorithm               Non ICU PCT Algorithm    ----------------------------     ------------------------------         PCT < 0.25 ng/mL                 PCT < 0.1 ng/mL     Stopping of antibiotics            Stopping of antibiotics       strongly encouraged.               strongly encouraged.    ----------------------------     ------------------------------       PCT level decrease by               PCT < 0.25 ng/mL       >= 80% from peak PCT       OR PCT 0.25 -  0.5 ng/mL          Stopping of antibiotics                                             encouraged.     Stopping of antibiotics           encouraged.    ----------------------------     ------------------------------       PCT level decrease by              PCT >= 0.25 ng/mL       < 80% from peak PCT        AND PCT >= 0.5 ng/mL            Continuing antibiotics                                               encouraged.       Continuing antibiotics            encouraged.    ----------------------------     ------------------------------     PCT level increase compared          PCT > 0.5 ng/mL         with peak PCT AND          PCT >= 0.5 ng/mL             Escalation of antibiotics                                          strongly encouraged.      Escalation of antibiotics        strongly encouraged.   Protime-INR     Status: None   Collection Time: 01/22/17  3:50 AM  Result Value Ref Range   Prothrombin Time 13.0  11.4 - 15.2 seconds   INR 0.98   APTT     Status: None   Collection Time: 01/22/17  3:50 AM  Result Value Ref Range   aPTT 34 24 - 36 seconds  Basic metabolic panel     Status: Abnormal   Collection Time: 01/22/17  3:50 AM  Result Value Ref Range   Sodium 136 135 - 145 mmol/L   Potassium 4.5 3.5 - 5.1 mmol/L   Chloride 105 101 - 111 mmol/L   CO2 27 22 - 32 mmol/L   Glucose, Bld 257 (H) 65 - 99 mg/dL   BUN 7 6 - 20 mg/dL   Creatinine, Ser 0.82 0.61 - 1.24 mg/dL   Calcium 7.2 (L) 8.9 - 10.3 mg/dL   GFR calc non Af Amer >60 >60 mL/min   GFR calc Af Amer >60 >60 mL/min    Comment: (NOTE) The eGFR has been calculated using the CKD EPI equation. This calculation has not been validated in all clinical situations. eGFR's persistently <60 mL/min signify possible Chronic Kidney Disease.    Anion gap 4 (L) 5 - 15  CBC     Status: Abnormal   Collection Time: 01/22/17  3:50 AM  Result Value Ref Range   WBC 11.2 (H) 4.0 - 10.5 K/uL   RBC 3.46 (  L) 4.22 - 5.81 MIL/uL   Hemoglobin 8.9 (L) 13.0 - 17.0 g/dL   HCT 28.6 (L) 39.0 - 52.0 %   MCV 82.7 78.0 - 100.0 fL   MCH 25.7 (L) 26.0 - 34.0 pg   MCHC 31.1 30.0 - 36.0 g/dL   RDW 14.8 11.5 - 15.5 %   Platelets 150 150 - 400 K/uL    Ct Chest W Contrast  Result Date: 01/22/2017 CLINICAL DATA:  Status post chest tube for right empyema. EXAM: CT CHEST WITH CONTRAST TECHNIQUE: Multidetector CT imaging of the chest was performed during intravenous contrast administration. CONTRAST:  15m ISOVUE-300 IOPAMIDOL (ISOVUE-300) INJECTION 61% COMPARISON:  Chest CT dated 01/16/2017. Chest x-ray from earlier same day. FINDINGS: Cardiovascular: Heart size is normal. No pericardial effusion. Coronary artery calcifications. No thoracic aortic aneurysm or dissection. Mediastinum/Nodes: Scattered small and moderate-sized lymph nodes within the mediastinum and perihilar regions, likely reactive in nature. Trachea and central bronchi are unremarkable. Lungs/Pleura: Large  complex collections are seen throughout the right chest, major components probably contiguous but also likely multiloculated to some degree. Dominant collection of fluid and air along the posterior aspects of the right upper lobe and right lower lobe measures over 23 cm craniocaudal dimension (series 7, image 66) and approximately 12 x 7 cm transverse by AP dimensions near the right lung base (series 7, image 61; series 3, image 126). The posterior collection appears contiguous with an additional large complex collection of fluid and air at the right lung apex (probable connection seen on series 3, image 52). The complex collection in the right lung apex measures 14 cm craniocaudal dimension and 5 cm thickness (series 7, image 54; series 3, image 63). Lastly, additional complex fluid collection is seen along the right lateral chest wall measuring 9 cm AP dimension and 2.5 cm thickness (series 3, image 80). This collection does appear contiguous with the collection at the right lung apex. Moderate-size layering pleural effusion on the left with adjacent compressive atelectasis. Upper Abdomen: Free fluid/ascites within the upper abdomen, at least a moderate amount, incompletely imaged. Musculoskeletal: No acute or suspicious osseous finding. Right-sided chest tube seen on CT of 01/16/2017 has been removed. IMPRESSION: 1. Large complex collections of fluid and air throughout the right chest, locations and measurements given above, largest collection along the posterior right chest measuring over 20 cm greatest dimension, presumed empyemas. These findings represent a significant worsening compared to the earlier chest CT of 01/16/2017. These collections appear to be contiguous but are also likely multiloculated to some degree. Right-sided chest tube has been removed in the interval. 2. Moderate-sized left pleural effusion. 3. Mediastinal and perihilar lymphadenopathy is likely reactive in nature. 4. Upper abdominal  ascites, incompletely imaged. These results were called by telephone at the time of interpretation on 01/22/2017 at 11:00 am to Dr. TLissa Morales, who verbally acknowledged these results. Electronically Signed   By: SFranki CabotM.D.   On: 01/22/2017 11:17   Dg Chest Portable 1 View  Result Date: 02/17/2017 CLINICAL DATA:  Shortness of breath.  History of empyema. EXAM: PORTABLE CHEST 1 VIEW COMPARISON:  PA and lateral chest 02/17/2017 and 01/18/2017. CT chest 01/16/2017. FINDINGS: Pleural effusion which appears loculated along the right chest wall is again seen. Right basilar effusion is new since the most recent examination. There is new airspace disease throughout the right chest. The left lung is clear. Heart size is normal. IMPRESSION: New airspace disease throughout the right chest with a new right basilar effusion  worrisome for pneumonia. Loculated right pleural effusion does not appear notably changed since the most recent exam. Electronically Signed   By: Inge Rise M.D.   On: 02/17/2017 20:03    Review of Systems  Unable to perform ROS: Mental acuity   Blood pressure 131/88, pulse 88, temperature 97.6 F (36.4 C), temperature source Oral, resp. rate 17, height 5' 9" (1.753 m), weight 141 lb 1.5 oz (64 kg), SpO2 97 %. Physical Exam  Constitutional: No distress.  HENT:  Head: Normocephalic and atraumatic.  Cardiovascular: Normal rate and regular rhythm.   Respiratory: Effort normal. He has no wheezes. He exhibits no tenderness.  Diminished BS on right  GI: Soft. There is no tenderness.  Musculoskeletal: He exhibits no edema.  Lymphadenopathy:    He has no cervical adenopathy.  Neurological:  Somnolent, R facial droop  Skin: Skin is warm and dry.    Assessment/Plan: 71 yo man with multiple medical issues including recent aspiration pneumonia and empyema, managed with antibiotics and chest tube placement, previous stroke, aspiration, severe protein calorie malnutrition,  ascites, diastolic dysfunction, stage II sacral pressure ulcer. He is a poor operative candidate due to malnutrition and altered mental status. The Wessner option is to place a chest tube to drain the effusion as much as possible. May have to consider intrapleural thrombolytic therapy if incompletely drained (which is the most likely scenario). He may require more than one tube.  He apparently has a nephew who is the next of kin but we do not have a contact number for him. Mr. Saraceno gives verbal agreement for CT placement, but it is difficult to tell how much he understands. Will proceed with CT placement out of medical necessity.   Melrose Nakayama 01/22/2017, 2:03 PM

## 2017-01-22 NOTE — Progress Notes (Signed)
PCCM Interval note  Dnase 5mg  and alteplase 10mg  instilled into R pleural space via chest tube at 17:45. Flushed with 60cc NS and then clamped.   Plan to place to -20cm H2O suction at ~18:45  Next dose in the am 8/5, plan bid x 3 days.   Levy Pupaobert Lyah Millirons, MD, PhD 01/22/2017, 5:54 PM [facility] [phone] or if no answer [phone]

## 2017-01-23 ENCOUNTER — Inpatient Hospital Stay (HOSPITAL_COMMUNITY): Payer: Medicare Other

## 2017-01-23 DIAGNOSIS — J9601 Acute respiratory failure with hypoxia: Secondary | ICD-10-CM

## 2017-01-23 LAB — BASIC METABOLIC PANEL
Anion gap: 5 (ref 5–15)
BUN: 14 mg/dL (ref 6–20)
CALCIUM: 7.5 mg/dL — AB (ref 8.9–10.3)
CO2: 28 mmol/L (ref 22–32)
CREATININE: 0.86 mg/dL (ref 0.61–1.24)
Chloride: 104 mmol/L (ref 101–111)
GFR calc Af Amer: >60 mL/min (ref >60)
GLUCOSE: 217 mg/dL — AB (ref 65–99)
POTASSIUM: 4.2 mmol/L (ref 3.5–5.1)
SODIUM: 137 mmol/L (ref 135–145)

## 2017-01-23 LAB — CBC
HEMATOCRIT: 25.9 % — AB (ref 39.0–52.0)
HEMATOCRIT: 24.7 % — AB (ref 39.0–52.0)
HEMOGLOBIN: 8.1 g/dL — AB (ref 13.0–17.0)
Hemoglobin: 7.7 g/dL — ABNORMAL LOW (ref 13.0–17.0)
MCH: 26 pg (ref 26.0–34.0)
MCH: 26.1 pg (ref 26.0–34.0)
MCHC: 31.3 g/dL (ref 30.0–36.0)
MCHC: 31.2 g/dL (ref 30.0–36.0)
MCV: 83.3 fL (ref 78.0–100.0)
MCV: 83.7 fL (ref 78.0–100.0)
PLATELETS: 140 10*3/uL — AB (ref 150–400)
Platelets: 177 10*3/uL (ref 150–400)
RBC: 3.11 MIL/uL — ABNORMAL LOW (ref 4.22–5.81)
RBC: 2.95 MIL/uL — ABNORMAL LOW (ref 4.22–5.81)
RDW: 14.9 % (ref 11.5–15.5)
RDW: 15.1 % (ref 11.5–15.5)
WBC: 7.2 10*3/uL (ref 4.0–10.5)
WBC: 6.2 10*3/uL (ref 4.0–10.5)

## 2017-01-23 LAB — URINE CULTURE: Culture: NO GROWTH

## 2017-01-23 LAB — GLUCOSE, CAPILLARY
GLUCOSE-CAPILLARY: 171 mg/dL — AB (ref 65–99)
Glucose-Capillary: 134 mg/dL — ABNORMAL HIGH (ref 65–99)
Glucose-Capillary: 181 mg/dL — ABNORMAL HIGH (ref 65–99)
Glucose-Capillary: 158 mg/dL — ABNORMAL HIGH (ref 65–99)

## 2017-01-23 LAB — MAGNESIUM: MAGNESIUM: 1.6 mg/dL — AB (ref 1.7–2.4)

## 2017-01-23 LAB — ABO/RH: ABO/RH(D): O POS

## 2017-01-23 LAB — TYPE AND SCREEN
ABO/RH(D): O POS
ANTIBODY SCREEN: NEGATIVE

## 2017-01-23 LAB — PHOSPHORUS: PHOSPHORUS: 2.6 mg/dL (ref 2.5–4.6)

## 2017-01-23 MED ORDER — FENTANYL 2500MCG IN NS 250ML (10MCG/ML) PREMIX INFUSION
25.0000 ug/h | INTRAVENOUS | Status: DC
  Administered 2017-01-23: 50 ug/h via INTRAVENOUS
  Administered 2017-01-24: 100 ug/h via INTRAVENOUS
  Filled 2017-01-23: qty 250

## 2017-01-23 MED ORDER — MIDAZOLAM HCL 2 MG/2ML IJ SOLN: 1.0000 mg | INTRAMUSCULAR | Status: DC | PRN

## 2017-01-23 MED ORDER — ORAL CARE MOUTH RINSE
15.0000 mL | Freq: Four times a day (QID) | OROMUCOSAL | Status: DC
  Administered 2017-01-24 (×2): 15 mL via OROMUCOSAL

## 2017-01-23 MED ORDER — FENTANYL CITRATE (PF) 100 MCG/2ML IJ SOLN
12.5000 ug | INTRAMUSCULAR | Status: DC | PRN
  Administered 2017-01-23: 25 ug via INTRAVENOUS
  Filled 2017-01-23: qty 2

## 2017-01-23 MED ORDER — MIDAZOLAM HCL 2 MG/2ML IJ SOLN
4.0000 mg | Freq: Once | INTRAMUSCULAR | Status: AC
  Administered 2017-01-23: 4 mg via INTRAVENOUS

## 2017-01-23 MED ORDER — MAGNESIUM SULFATE 2 GM/50ML IV SOLN
2.0000 g | Freq: Once | INTRAVENOUS | Status: AC
  Administered 2017-01-23: 2 g via INTRAVENOUS
  Filled 2017-01-23: qty 50

## 2017-01-23 MED ORDER — MIDAZOLAM HCL 2 MG/2ML IJ SOLN
INTRAMUSCULAR | Status: AC
  Filled 2017-01-23: qty 2

## 2017-01-23 MED ORDER — INSULIN ASPART 100 UNIT/ML ~~LOC~~ SOLN
2.0000 [IU] | SUBCUTANEOUS | Status: DC
  Administered 2017-01-23 – 2017-01-24 (×2): 4 [IU] via SUBCUTANEOUS
  Administered 2017-01-25 (×2): 2 [IU] via SUBCUTANEOUS
  Administered 2017-01-25: 4 [IU] via SUBCUTANEOUS
  Administered 2017-01-26 – 2017-01-31 (×3): 2 [IU] via SUBCUTANEOUS

## 2017-01-23 MED ORDER — FENTANYL BOLUS VIA INFUSION
25.0000 ug | INTRAVENOUS | Status: DC | PRN
  Administered 2017-01-24 – 2017-01-25 (×5): 25 ug via INTRAVENOUS
  Filled 2017-01-23: qty 25

## 2017-01-23 MED ORDER — FENTANYL CITRATE (PF) 100 MCG/2ML IJ SOLN
100.0000 ug | Freq: Once | INTRAMUSCULAR | Status: AC
  Administered 2017-01-23: 100 ug via INTRAVENOUS

## 2017-01-23 MED ORDER — FENTANYL CITRATE (PF) 100 MCG/2ML IJ SOLN
50.0000 ug | INTRAMUSCULAR | Status: DC | PRN
  Filled 2017-01-23: qty 2

## 2017-01-23 MED ORDER — FENTANYL CITRATE (PF) 100 MCG/2ML IJ SOLN
INTRAMUSCULAR | Status: AC
  Filled 2017-01-23: qty 4

## 2017-01-23 MED ORDER — ETOMIDATE 2 MG/ML IV SOLN
10.0000 mg | Freq: Once | INTRAVENOUS | Status: AC
  Administered 2017-01-23: 10 mg via INTRAVENOUS

## 2017-01-23 MED ORDER — MIDAZOLAM HCL 2 MG/2ML IJ SOLN
INTRAMUSCULAR | Status: AC
  Filled 2017-01-23: qty 4

## 2017-01-23 MED ORDER — FENTANYL 2500MCG IN NS 250ML (10MCG/ML) PREMIX INFUSION
50.0000 ug/h | INTRAVENOUS | Status: DC
  Filled 2017-01-23: qty 250

## 2017-01-23 MED ORDER — MIDAZOLAM HCL 2 MG/2ML IJ SOLN
2.0000 mg | Freq: Once | INTRAMUSCULAR | Status: AC
  Administered 2017-01-23: 2 mg via INTRAVENOUS

## 2017-01-23 MED ORDER — PANTOPRAZOLE SODIUM 40 MG IV SOLR
40.0000 mg | INTRAVENOUS | Status: DC
  Administered 2017-01-23 – 2017-01-25 (×3): 40 mg via INTRAVENOUS
  Filled 2017-01-23 (×3): qty 40

## 2017-01-23 MED ORDER — MIDAZOLAM HCL 2 MG/2ML IJ SOLN
1.0000 mg | INTRAMUSCULAR | Status: DC | PRN
  Administered 2017-01-24: 1 mg via INTRAVENOUS
  Filled 2017-01-23: qty 2

## 2017-01-23 MED ORDER — ORAL CARE MOUTH RINSE: 15.0000 mL | Freq: Two times a day (BID) | OROMUCOSAL | Status: DC

## 2017-01-23 MED ORDER — FENTANYL CITRATE (PF) 100 MCG/2ML IJ SOLN
50.0000 ug | INTRAMUSCULAR | Status: DC | PRN
  Administered 2017-01-23: 25 ug via INTRAVENOUS

## 2017-01-23 MED ORDER — CHLORHEXIDINE GLUCONATE 0.12 % MT SOLN
15.0000 mL | Freq: Two times a day (BID) | OROMUCOSAL | Status: DC
  Administered 2017-01-23: 15 mL via OROMUCOSAL

## 2017-01-23 MED ORDER — IPRATROPIUM-ALBUTEROL 0.5-2.5 (3) MG/3ML IN SOLN
3.0000 mL | RESPIRATORY_TRACT | Status: DC | PRN
  Administered 2017-01-25: 3 mL via RESPIRATORY_TRACT
  Filled 2017-01-23: qty 3

## 2017-01-23 MED ORDER — ROCURONIUM BROMIDE 50 MG/5ML IV SOLN
50.0000 mg | Freq: Once | INTRAVENOUS | Status: AC
  Administered 2017-01-23: 50 mg via INTRAVENOUS

## 2017-01-23 NOTE — Significant Event (Addendum)
Rapid Response Event Note  Overview: Time Called: 1156 Arrival Time: 1156 Event Type: Respiratory  Initial Focused Assessment:  Called to bedside for acute respiratory distress. Pt received alteplase and DNase instillation into R chest tube earlier this morning by CCM team for presumed loculated pleural effusion. Now with hemoptysis and SOB.  Cherly HensenBernadette, RN states R chest tube placed to suction resulting in a total of 160 ml bloody output.   RT at bedside, NTS performed ptma which resulted in large amount of frank blood.  Pt started on 100% NRB by RT.  On exam the pt has tachypnea, accessory muscle use and diffuse rhonchi bilaterally.  O2 sats 97%.  RR 28.  BP 206/114.  HR 125.  Pt was arousable to voice.    Dr. Delton CoombesByrum was notified and responded to bedside, pt was immediately prepared for intubation.  A total of 30 mg etomidate was given during the procedure.  ETT was placed successfully by Dr. Delton CoombesByrum and [facility]  100 mcg fentanyl and 2 mg versed given prior to transport to 2MW.     Event Summary: Name of Physician Notified: Dr. Delton CoombesByrum at 1200    at    Outcome: Transferred (Comment)  Event End Time: 1300  Dan MakerDUNLOP, Lannie Yusuf

## 2017-01-23 NOTE — Progress Notes (Signed)
Dr Delton CoombesByrum performs time-out at bedside w/ Resp Therapy, Nigel SloopBernadette Roncallo, RN, and Clydia LlanoJames Dunlap, RN, Rapid Response at bedside.  Etomidate 20mg  IVP at 1215.  Pt being bagged per Resp Therapy. Dr. Delton CoombesByrum at head of bed for intubation. P137, BP 176/121, sats 100%.  Dr. Delton CoombesByrum inserts ETT #8 w/assist of Glidescope at 1217; Rapid Response administers Etomidate 10mg  per verbal order of Dr. Delton CoombesByrum.  ETT suctioned.  Positive color change; positive End Tidal.  ETT placed at 25 cm at upper lip line; secured.  Pt placed on vent PRVC, RR16, Peep 5, FiO2 at 100%; TV 530.  Port CXR being obtained at bedside.

## 2017-01-23 NOTE — Progress Notes (Signed)
Dr. Delton CoombesByrum at bedside; plan to intubate patient.  Bed request placed for ICU made.  Pt remains on 100% NRB.  Bright red blood noted in chest tube - 60ml since events began.  BP 178/131 (145). P137.

## 2017-01-23 NOTE — Progress Notes (Signed)
PCCM Interval Note  Instilled alteplase 10mg , DNase 5mg  via R chest tube. Clamped. Patient tolerated with some cough.   Plan to place to suction in ~ 1 hour, repeat twice a day.   Discussed with pharmacy who recommended that we d/c the enoxaparin while he is getting local lytics. I agree with this, will change to SCD's  Jay Pupaobert Lyrica Mcclarty, MD, PhD 01/23/2017, 11:03 AM [facility] [phone] or if no answer [phone]

## 2017-01-23 NOTE — Progress Notes (Signed)
RN called for assistance at 1150 d/t respiratory distress.  Pt spitting up blood via oral airway.  Chest tube unclamped s/p TPA administration (due for unclamp at noon).  Immediate attempt at oral suction; respiratory therapy paged and attempted nasal suction which resulted in approx 100ml bright red blood. Pt then placed on 100% NRB.  Rapid response paged.  STAT portable CXR ordered along w/type and screen for potential need for blood transfusion.  Pt minimally responsive.  Dr. Delton CoombesByrum, Pulm/Critical Care paged.

## 2017-01-23 NOTE — Progress Notes (Signed)
Called to bedside for respiratory distress.  Pt coughing up blood and BS audible rhonchi.  NTS for large amounts of frank hemoptysis. Placed on 100% NRB and removed Nasal Cannula.  Sat 95%, RR 33, HR 138, accessory muscle use.  Dr Delton CoombesByrum arrived and assisted with intubation.  8.0 ET tube inserted on first attempt and confirmed placement at 25 cm at the lip with ETCO2, Bilat BS equal and rhonchus, X-Ray taken and placement ok.  Initial vent settings VT 530, RR 16, 5 PEEP, 100% O2.  Prepping patient for transfer to ICU.

## 2017-01-23 NOTE — Progress Notes (Signed)
eLink Physician-Brief Progress Note Patient Name: Jay HibbsJessie Sebastiano DOB: 07/23/1945 MRN: [id number]   Date of Service  01/23/2017  HPI/Events of Note  Patient intubated and ventilated. Notified of need for stress ulcer prophylaxis.   eICU Interventions  Will order: 1. Protonix IV.      Intervention Category Intermediate Interventions: [place]-practice therapies (e.g. DVT, beta blocker, etc.)  Marimar Suber Eugene 01/23/2017, 8:39 PM

## 2017-01-23 NOTE — Progress Notes (Signed)
Pt self extubated. Re intubated by MD BBS=. ETCO2+. Placed on previous vent settings. Pt tolerating well at this time

## 2017-01-23 NOTE — Progress Notes (Signed)
      [address]       [place] [id number]             [phone]      More alert today Denies pain and shortness of breath CT in place with minimal drainage CXR unchanged Appreciate Dr. Kavin LeechByrum's assistance with thrombolytics Will try to give thrombolytics a chance to breakdown pleural collection and allow it to drain Still no contact with family  Salvatore DecentSteven C. Dorris FetchHendrickson, MD [facility] [phone]

## 2017-01-23 NOTE — Procedures (Signed)
Intubation Procedure Note Jay Stephens [id number] 03-03-1946  Procedure: Intubation Indications: Respiratory insufficiency  Procedure Details Consent: Unable to obtain consent because of emergent medical necessity. Time Out: Verified patient identification, verified procedure, site/side was marked, verified correct patient position, special equipment/implants available, medications/allergies/relevent history reviewed, required imaging and test results available.  Performed  Maximum sterile technique was used including gloves, gown, hand hygiene and mask.   8.0 ET tube placed using a Mac 4 glidescope. Secured at 25 cm upper lip. Position confirmed by direct visualization, ETCO2, auscultation.    Evaluation Hemodynamic Status: Transient hypertension, better w sedation; O2 sats: stable throughout Patient's Current Condition: stable Complications: No apparent complications Patient did tolerate procedure well. Chest X-ray ordered to verify placement.  CXR: tube position acceptable.  [name], MD, PhD 01/23/2017, 12:40 PM [facility] [phone] or if no answer [phone]

## 2017-01-23 NOTE — Progress Notes (Signed)
PCCM Interval Note  Called urgently to patient's room for evolving resp distress. He initially tolerated instillation of TPA and DNase earlier today although note he did have some mild cough at the time. He then experienced an episode of hemoptysis, estimated at 100cc, about 11:50 AM. His chest tube was placed to suction to evacuate lytics, approximately 90 mL initially obtained dark blood. Another 70 mL obtained, total about 160 mL. Patient was tachypneic, in significant respiratory distress, saturating adequately after laced on 100% facemask. Also speak with him, explained that I felt he needed airway protection and ventilation. He understood.  Vitals:   01/23/17 0307 01/23/17 0707 01/23/17 0907 01/23/17 1055  BP: 124/74     Pulse: 90     Resp: 20   19  Temp: 98 F (36.7 C) 97.9 F (36.6 C)  98 F (36.7 C)  TempSrc: Oral Oral  Oral  SpO2: 100%  100% 99%  Weight:      Height:       Gen: Thin ill-appearing man, in respiratory distress  ENT: No active hemoptysis at this time, possibly 100 mL in suction canister  Neck: No stridor. Some upper airway secretions audible but I'm unable to suction  Lungs: Coarse on the right, clear on the left  Cardiovascular: Tachycardic, hypertensive, no murmur, no edema  Musculoskeletal: No deformities, no cyanosis or clubbing  Neuro: Much less responsive than earlier today. Able to arouse him and have him answer simple questions. Poorly oriented. Moves upper extremities  Skin: No evident lesions or rash   Impression:  Hemoptysis and acute respiratory failure following installation of alteplase / DNase Complicated right pleural space, must now consider possible bronchopleural fistula as cause for intra-airway blood  Plans:  ETT intubation for airway protection and mechanical ventilation Leave right chest tube to suction Continue his current antibiotics Suspect that we will not be able to continue alteplase/DNase, certainly not for now. Could  consider retrying if he stabilizes Follow serial CBC in the event that there is active intrapleural bleeding (looks like old blood) Stat portable chest x-ray post intubation to assess for evolving effusion, blood, pneumothorax May be beneficial to repeat his CT scan of the chest at some point once we achieve stability I will review his status with Dr Dorris FetchHendrickson, also his primary service.   Independent CC time 45 minutes  Jay Pupaobert Merritt Mccravy, MD, PhD 01/23/2017, 12:35 PM [facility] [phone] or if no answer [phone]

## 2017-01-23 NOTE — Progress Notes (Signed)
FPTS Interim Progress Note Hemoptysis and respiratory insufficieny during lytic therapy needing intubation and ICU care.  Almon HerculesGonfa, Brexlee Heberlein T, MD 01/23/2017, 1:58 PM PGY-3, [facility] pager [phone]

## 2017-01-23 NOTE — Procedures (Signed)
Intubation Procedure Note Jay Stephens [id number] 1946/01/24  Procedure: In tubation Indications: Airway protection and maintenance  Procedure Details Consent: emergent circumstances. unplanned extubation Time Out: Verified patient identification, verified procedure, site/side was marked, verified correct patient position, special equipment/implants available, medications/allergies/relevent history reviewed, required imaging and test results available.  Performed  Maximum sterile technique was used including gloves and mask.  MAC and 4    Evaluation Hemodynamic Status: BP stable throughout; O2 sats: stable throughout Patient's Current Condition: stable Copious secretions. No overt bleeding. Complications: No apparent complications Patient did tolerate procedure well. Chest X-ray ordered to verify placement.  CXR: pending. Attempted to insert a size 8 ETT it would not pass. Re-attempted with size 7.5 ETT and it was successful. 24 At lip   Capnometer showed color change, resp within ETT, bilateral breath sounds auscultated  Jay Stephens 01/23/2017

## 2017-01-23 NOTE — Progress Notes (Signed)
Name: Jay Stephens MRN: [id number] DOB: 06-14-1946    ADMISSION DATE:  01/31/2017 CONSULTATION DATE:  01/22/17  REFERRING MD :  Dr Dorris Fetch, TCTS  Reason for Consultation: Complicated R pleural space, loculated effusion.   SIGNIFICANT EVENTS  R empyema (largely) drained with chest tube 7/23 - 7/30, S viridans  D/c to SNF on 8/3 Readmitted with hypoxemia and AMS, 8/3 evening. Found to have evolving multiple pockets pleural fluid  STUDIES:  CT chest 8/4 >> multiple large fluid collections with probable loculations, possibly contiguous, with some air pockets. Also moderate layering L effusion.   HISTORY OF PRESENT ILLNESS:  71 yo debilitated man w hx malnutrition, HTN and diastolic dysfxn, recent aspiration PNA c/b S viridans R empyema. This was drained by chest tube with improvement but some residual fluid pockets of fluid by CT chest 01/16/17. He was discharged to SNF on 8/3 but returned later the same day with encephalopathy, dyspnea, hypoxemia. Treated with broad spectrum Abx and transiently required BiPAP. Repeat CT scan 8/4 shows multiple large fluid collections largest posterior R chest, other possibly contiguous collections. TCTS and PCCM consulted to help w management.   SUBJECTIVE:  TPA/DNase instilled 8/4 in the afternoon then placed to suction. Reportedly only about 40 mL of fluid obtained. Patient was evaluated by speech therapy and confirmed that he is cleared for a dysphagia 1, nectar thick diet  VITAL SIGNS: Temp:  [97.6 F (36.4 C)-98 F (36.7 C)] 98 F (36.7 C) (08/05 0307) Pulse Rate:  [87-90] 90 (08/05 0307) Resp:  [16-20] 20 (08/05 0307) BP: (124-135)/(74-84) 124/74 (08/05 0307) SpO2:  [97 %-100 %] 100 % (08/05 0307)  PHYSICAL EXAMINATION: General:  Cachectic man, laying comfortably, NAD Neuro:  Awake and alert, poorly oriented, unable to fully answer simple questions, ? Some degree dementia, moves all ext HEENT:  Op clear, poor dentition Cardiovascular:   Regular, no M Lungs:  Coarse on R, L clear, R chest tube in place without any wall suction Abdomen:  Soft, benign Musculoskeletal:  Thin, no deformity Skin:  No rashes   Recent Labs Lab 01-31-2017 1937 01/22/17 0350 01/23/17 0244  NA 136 136 137  K 4.5 4.5 4.2  CL 101 105 104  CO2 23 27 28   BUN 5* 7 14  CREATININE 0.82 0.82 0.86  GLUCOSE 186* 257* 217*    Recent Labs Lab 2017-01-31 1937 01/22/17 0350 01/23/17 0244  HGB 10.5* 8.9* 8.1*  HCT 33.3* 28.6* 25.9*  WBC 12.9* 11.2* 7.2  PLT 150 150 177   Ct Chest W Contrast  Result Date: 01/22/2017 CLINICAL DATA:  Status post chest tube for right empyema. EXAM: CT CHEST WITH CONTRAST TECHNIQUE: Multidetector CT imaging of the chest was performed during intravenous contrast administration. CONTRAST:  75mL ISOVUE-300 IOPAMIDOL (ISOVUE-300) INJECTION 61% COMPARISON:  Chest CT dated 01/16/2017. Chest x-ray from earlier same day. FINDINGS: Cardiovascular: Heart size is normal. No pericardial effusion. Coronary artery calcifications. No thoracic aortic aneurysm or dissection. Mediastinum/Nodes: Scattered small and moderate-sized lymph nodes within the mediastinum and perihilar regions, likely reactive in nature. Trachea and central bronchi are unremarkable. Lungs/Pleura: Large complex collections are seen throughout the right chest, major components probably contiguous but also likely multiloculated to some degree. Dominant collection of fluid and air along the posterior aspects of the right upper lobe and right lower lobe measures over 23 cm craniocaudal dimension (series 7, image 66) and approximately 12 x 7 cm transverse by AP dimensions near the right lung base (series 7, image 61; series  3, image 126). The posterior collection appears contiguous with an additional large complex collection of fluid and air at the right lung apex (probable connection seen on series 3, image 52). The complex collection in the right lung apex measures 14 cm  craniocaudal dimension and 5 cm thickness (series 7, image 54; series 3, image 63). Lastly, additional complex fluid collection is seen along the right lateral chest wall measuring 9 cm AP dimension and 2.5 cm thickness (series 3, image 80). This collection does appear contiguous with the collection at the right lung apex. Moderate-size layering pleural effusion on the left with adjacent compressive atelectasis. Upper Abdomen: Free fluid/ascites within the upper abdomen, at least a moderate amount, incompletely imaged. Musculoskeletal: No acute or suspicious osseous finding. Right-sided chest tube seen on CT of 01/16/2017 has been removed. IMPRESSION: 1. Large complex collections of fluid and air throughout the right chest, locations and measurements given above, largest collection along the posterior right chest measuring over 20 cm greatest dimension, presumed empyemas. These findings represent a significant worsening compared to the earlier chest CT of 01/16/2017. These collections appear to be contiguous but are also likely multiloculated to some degree. Right-sided chest tube has been removed in the interval. 2. Moderate-sized left pleural effusion. 3. Mediastinal and perihilar lymphadenopathy is likely reactive in nature. 4. Upper abdominal ascites, incompletely imaged. These results were called by telephone at the time of interpretation on 01/22/2017 at 11:00 am to Dr. Acquanetta BellingDD MCDIARMID , who verbally acknowledged these results. Electronically Signed   By: Bary RichardStan  Maynard M.D.   On: 01/22/2017 11:17   Dg Chest Port 1 View  Result Date: 01/22/2017 CLINICAL DATA:  Empyema.  Status post chest tube placement. EXAM: PORTABLE CHEST 1 VIEW COMPARISON:  CT chest 0 8/0 scratch the CT chest earlier today. Single-view of the chest 09-17-2016. FINDINGS: A new right chest tube is in place. Loculated pleural fluid collections in the right chest do not appear changed. Airspace disease in the right lung is also not notably  changed. The left lung is expanded and clear. Left pleural effusion is noted. IMPRESSION: No marked change in loculated right pleural effusions and airspace disease with a new chest tube in place. Negative for pneumothorax. Small left pleural effusion seen on prior CT scan is not well demonstrated on this exam. Electronically Signed   By: Drusilla Kannerhomas  Dalessio M.D.   On: 01/22/2017 15:51   Dg Chest Portable 1 View  Result Date: 02/17/2017 CLINICAL DATA:  Shortness of breath.  History of empyema. EXAM: PORTABLE CHEST 1 VIEW COMPARISON:  PA and lateral chest 01/22/2017 and 01/18/2017. CT chest 01/16/2017. FINDINGS: Pleural effusion which appears loculated along the right chest wall is again seen. Right basilar effusion is new since the most recent examination. There is new airspace disease throughout the right chest. The left lung is clear. Heart size is normal. IMPRESSION: New airspace disease throughout the right chest with a new right basilar effusion worrisome for pneumonia. Loculated right pleural effusion does not appear notably changed since the most recent exam. Electronically Signed   By: Drusilla Kannerhomas  Dalessio M.D.   On: 09-17-2016 20:03    ASSESSMENT / PLAN: Complicated R pleural space with probable loculated pleural effusions. Probably empyema.  Dysphagia and aspiration pneumonia Ascites of uncertain etiology Malnutrition  Sacral pressure sore  - Repeat installation of DNase/TPA, planned for 5 more doses, twice a day - Follow pleural fluid output - Obtain pleural fluid cultures when increased output - Agree with broad-spectrum antibiotics for  now, narrow based on culture data when available - We will reimage with chest x-ray, likely repeat a CT scan of the chest after the lytic regimen has been completed - Review imaging and discuss next steps with thoracic surgery next week, plans will depend on whether we can get adequate drainage and infection control.   Levy Pupaobert Salimatou Simone, MD, PhD 01/23/2017, 7:17  AM [facility] [phone] or if no answer [phone]

## 2017-01-23 NOTE — Progress Notes (Addendum)
Family Medicine Teaching Service Daily Progress Note Intern Pager: [phone]  Patient name: Jay Stephens Medical record number: [id number] Date of birth: Oct 29, 1945 Age: 71 y.o. Gender: male  Primary Care Provider: System, Pcp Not In Consultants: N/A Code Status: FULL  Pt Overview and Major Events to Date:  Patient is s/p chest tube for large R empyema, I/D for dental abscess and stage 2 sacral ulcer, AMS..he readmits the same day as discharge to SNF for oxygen desaturation while eating at SNF into 70's.  Assessment and Plan: Patient is s/p chest tube for large R empyema, I/D for dental abscess and stage 2 sacral ulcer, AMS..he readmits the same day as discharge to SNF for oxygen desaturation while eating at SNF into 70's.  Resp distress:  empyema vs pneumothorax vs lung mass vs aspiration pneumonia.   On Admission Patient met SIRS criteria with lactic acid of 5.6, tachycardia to 134, tachypnea on CPAP was satting 80% with EMS.    -admit to step down -Chest CT 8/4 showed loculated accumulations in R lung, CVTS surgery consulted -CXR showed R sided effusions/consolidation, Repeat CXR 08/05 - no sig change -Chest tube wound slightly purulent, minimal serosanguinous output today -installation of DNase/TPA lytic therapy in chest tube, planned for 5 more doses, twice a day per CCM -daily dressing changes  -O2 nasal canula on 2 L, Sats 97+% -ABx: vanc/zosyn -procalcitonin, 2.90 on 08/04 -consider Lasix w/ worsening respiratory status and increase BNP -continuous pulse ox -am EKG -trend lactic acid, stable ~1.5x2 -am CBC, BMP -follow blood cultures, no growth @ 24 hrs -follow urine culture, pending -CCM to get pleural fluid cultures when output increases  Dental Absess s/p I/D: -sutures are absorbable -chlorhexadine mouthwash TID until 8/15  Sacral pressure ulcer -stage 2 -wound care consulted  Protein-calorie malnutrition: Patient is cachectic, which is likely due to a chronic  illness such as cancer as well as his low function at baseline.  Nephew reported earlier that he has to be fed at home.  Albumin on admission is 1.3.  Seen by nutrition and palliative care during his recent hospitalization.  -monitor Mag, Phos, K daily for refeeding syndrome -continue to follow nutrition and palliative care recs  FEN/GI: Dysphagia 1 Diet PPx: N/A  Disposition: in step down SNF when medically cleared  Subjective:  Patient still talkative but not oriented, says he is not in pain. No complaints of pain or SOB  Objective: Temp:  [97.6 F (36.4 C)-98 F (36.7 C)] 97.9 F (36.6 C) (08/05 0707) Pulse Rate:  [87-90] 90 (08/05 0307) Resp:  [16-20] 20 (08/05 0307) BP: (124-135)/(74-84) 124/74 (08/05 0307) SpO2:  [97 %-100 %] 100 % (08/05 0907) Physical Exam: General: Frail, slow to respond and in pain when rolled to his side for wound inspection, NAD Eyes: EOMI ENTM: dry mucous membranes, 2L nasal canula Neck: supple, nontender Cardiovascular: RRR, no MRG, no JVD Respiratory: scant rhonci in RUL, no breath sounds in right middle or lower lung filed,  left lung fields clear to auscultation. Right chest tube, minimal output, serosanguinous  Gastrointestinal: +bowel sounds, nontender MSK: thin extremities, generalized weakness Derm: grade 2 sacral ulcer Neuro: alert, cooperative,  Psych: mood seems appropriate, unable to assess fully  Laboratory:  Recent Labs Lab 01/31/2017 1937 01/22/17 0350 01/23/17 0244  WBC 12.9* 11.2* 7.2  HGB 10.5* 8.9* 8.1*  HCT 33.3* 28.6* 25.9*  PLT 150 150 177    Recent Labs Lab 01/31/2017 1937 01/22/17 0350 01/23/17 0244  NA 136 136 137  K  4.5 4.5 4.2  CL 101 105 104  CO2 23 27 28   BUN 5* 7 14  CREATININE 0.82 0.82 0.86  CALCIUM 7.5* 7.2* 7.5*  PROT 6.1*  --   --   BILITOT 0.9  --   --   ALKPHOS 102  --   --   ALT 14*  --   --   AST 47*  --   --   GLUCOSE 186* 257* 217*   Ct Chest W Contrast  Result Date:  01/22/2017 CLINICAL DATA:  Status post chest tube for right empyema. EXAM: CT CHEST WITH CONTRAST TECHNIQUE: Multidetector CT imaging of the chest was performed during intravenous contrast administration. CONTRAST:  15m ISOVUE-300 IOPAMIDOL (ISOVUE-300) INJECTION 61% COMPARISON:  Chest CT dated 01/16/2017. Chest x-ray from earlier same day. FINDINGS: Cardiovascular: Heart size is normal. No pericardial effusion. Coronary artery calcifications. No thoracic aortic aneurysm or dissection. Mediastinum/Nodes: Scattered small and moderate-sized lymph nodes within the mediastinum and perihilar regions, likely reactive in nature. Trachea and central bronchi are unremarkable. Lungs/Pleura: Large complex collections are seen throughout the right chest, major components probably contiguous but also likely multiloculated to some degree. Dominant collection of fluid and air along the posterior aspects of the right upper lobe and right lower lobe measures over 23 cm craniocaudal dimension (series 7, image 66) and approximately 12 x 7 cm transverse by AP dimensions near the right lung base (series 7, image 61; series 3, image 126). The posterior collection appears contiguous with an additional large complex collection of fluid and air at the right lung apex (probable connection seen on series 3, image 52). The complex collection in the right lung apex measures 14 cm craniocaudal dimension and 5 cm thickness (series 7, image 54; series 3, image 63). Lastly, additional complex fluid collection is seen along the right lateral chest wall measuring 9 cm AP dimension and 2.5 cm thickness (series 3, image 80). This collection does appear contiguous with the collection at the right lung apex. Moderate-size layering pleural effusion on the left with adjacent compressive atelectasis. Upper Abdomen: Free fluid/ascites within the upper abdomen, at least a moderate amount, incompletely imaged. Musculoskeletal: No acute or suspicious osseous  finding. Right-sided chest tube seen on CT of 01/16/2017 has been removed. IMPRESSION: 1. Large complex collections of fluid and air throughout the right chest, locations and measurements given above, largest collection along the posterior right chest measuring over 20 cm greatest dimension, presumed empyemas. These findings represent a significant worsening compared to the earlier chest CT of 01/16/2017. These collections appear to be contiguous but are also likely multiloculated to some degree. Right-sided chest tube has been removed in the interval. 2. Moderate-sized left pleural effusion. 3. Mediastinal and perihilar lymphadenopathy is likely reactive in nature. 4. Upper abdominal ascites, incompletely imaged. These results were called by telephone at the time of interpretation on 01/22/2017 at 11:00 am to Dr. TLissa Morales, who verbally acknowledged these results. Electronically Signed   By: SFranki CabotM.D.   On: 01/22/2017 11:17   Dg Chest Port 1 View  Result Date: 01/22/2017 CLINICAL DATA:  Empyema.  Status post chest tube placement. EXAM: PORTABLE CHEST 1 VIEW COMPARISON:  CT chest 0 8/0 scratch the CT chest earlier today. Single-view of the chest 02/18/2017. FINDINGS: A new right chest tube is in place. Loculated pleural fluid collections in the right chest do not appear changed. Airspace disease in the right lung is also not notably changed. The left lung is expanded and  clear. Left pleural effusion is noted. IMPRESSION: No marked change in loculated right pleural effusions and airspace disease with a new chest tube in place. Negative for pneumothorax. Small left pleural effusion seen on prior CT scan is not well demonstrated on this exam. Electronically Signed   By: Inge Rise M.D.   On: 01/22/2017 15:51   Dg Chest Portable 1 View  Result Date: 01/23/2017 CLINICAL DATA:  Shortness of breath.  History of empyema. EXAM: PORTABLE CHEST 1 VIEW COMPARISON:  PA and lateral chest 01/25/2017 and  01/18/2017. CT chest 01/16/2017. FINDINGS: Pleural effusion which appears loculated along the right chest wall is again seen. Right basilar effusion is new since the most recent examination. There is new airspace disease throughout the right chest. The left lung is clear. Heart size is normal. IMPRESSION: New airspace disease throughout the right chest with a new right basilar effusion worrisome for pneumonia. Loculated right pleural effusion does not appear notably changed since the most recent exam. Electronically Signed   By: Inge Rise M.D.   On: 02/05/2017 20:03     Bonnita Hollow, MD 01/23/2017, 9:35 AM PGY-1, [place] Intern pager: [phone], text pages welcome

## 2017-01-24 ENCOUNTER — Inpatient Hospital Stay (HOSPITAL_COMMUNITY): Payer: Medicare Other

## 2017-01-24 ENCOUNTER — Encounter (HOSPITAL_COMMUNITY): Payer: Self-pay | Admitting: *Deleted

## 2017-01-24 LAB — URINALYSIS, ROUTINE W REFLEX MICROSCOPIC
BILIRUBIN URINE: NEGATIVE
Bacteria, UA: NONE SEEN
Glucose, UA: NEGATIVE mg/dL
Ketones, ur: NEGATIVE mg/dL
LEUKOCYTES UA: NEGATIVE
NITRITE: NEGATIVE
PH: 5 (ref 5.0–8.0)
Protein, ur: NEGATIVE mg/dL
SQUAMOUS EPITHELIAL / LPF: NONE SEEN
Specific Gravity, Urine: 1.01 (ref 1.005–1.030)

## 2017-01-24 LAB — CBC
HCT: 25.4 % — ABNORMAL LOW (ref 39.0–52.0)
HCT: 23.6 % — ABNORMAL LOW (ref 39.0–52.0)
Hemoglobin: 7.8 g/dL — ABNORMAL LOW (ref 13.0–17.0)
Hemoglobin: 7.2 g/dL — ABNORMAL LOW (ref 13.0–17.0)
MCH: 26 pg (ref 26.0–34.0)
MCH: 25.7 pg — AB (ref 26.0–34.0)
MCHC: 30.7 g/dL (ref 30.0–36.0)
MCHC: 30.5 g/dL (ref 30.0–36.0)
MCV: 84.7 fL (ref 78.0–100.0)
MCV: 84.3 fL (ref 78.0–100.0)
PLATELETS: 179 10*3/uL (ref 150–400)
PLATELETS: 196 10*3/uL (ref 150–400)
RBC: 3 MIL/uL — AB (ref 4.22–5.81)
RBC: 2.8 MIL/uL — ABNORMAL LOW (ref 4.22–5.81)
RDW: 15.3 % (ref 11.5–15.5)
RDW: 15.1 % (ref 11.5–15.5)
WBC: 5.1 10*3/uL (ref 4.0–10.5)
WBC: 7.8 10*3/uL (ref 4.0–10.5)

## 2017-01-24 LAB — GLUCOSE, CAPILLARY
GLUCOSE-CAPILLARY: 83 mg/dL (ref 65–99)
GLUCOSE-CAPILLARY: 86 mg/dL (ref 65–99)
GLUCOSE-CAPILLARY: 116 mg/dL — AB (ref 65–99)
GLUCOSE-CAPILLARY: 162 mg/dL — AB (ref 65–99)
Glucose-Capillary: 117 mg/dL — ABNORMAL HIGH (ref 65–99)
Glucose-Capillary: 145 mg/dL — ABNORMAL HIGH (ref 65–99)

## 2017-01-24 LAB — PROCALCITONIN: Procalcitonin: 2.66 ng/mL

## 2017-01-24 LAB — BASIC METABOLIC PANEL
ANION GAP: 9 (ref 5–15)
BUN: 11 mg/dL (ref 6–20)
CALCIUM: 7.2 mg/dL — AB (ref 8.9–10.3)
CO2: 26 mmol/L (ref 22–32)
Chloride: 103 mmol/L (ref 101–111)
Creatinine, Ser: 0.79 mg/dL (ref 0.61–1.24)
Glucose, Bld: 87 mg/dL (ref 65–99)
Potassium: 3.8 mmol/L (ref 3.5–5.1)
SODIUM: 138 mmol/L (ref 135–145)

## 2017-01-24 LAB — MAGNESIUM: MAGNESIUM: 1.8 mg/dL (ref 1.7–2.4)

## 2017-01-24 LAB — PROTIME-INR
INR: 1.06
PROTHROMBIN TIME: 13.9 s (ref 11.4–15.2)

## 2017-01-24 MED ORDER — VITAL AF 1.2 CAL PO LIQD
1000.0000 mL | ORAL | Status: DC
  Administered 2017-01-24 – 2017-01-25 (×2): 1000 mL
  Filled 2017-01-24: qty 1000

## 2017-01-24 MED ORDER — MIDAZOLAM HCL 2 MG/2ML IJ SOLN: 1.0000 mg | INTRAMUSCULAR | Status: DC | PRN

## 2017-01-24 MED ORDER — CHLORHEXIDINE GLUCONATE 0.12% ORAL RINSE (MEDLINE KIT)
15.0000 mL | Freq: Two times a day (BID) | OROMUCOSAL | Status: AC
  Administered 2017-01-24 – 2017-02-03 (×18): 15 mL via OROMUCOSAL

## 2017-01-24 MED ORDER — ACETAMINOPHEN 160 MG/5ML PO SOLN: 650.0000 mg | Freq: Four times a day (QID) | ORAL | Status: DC | PRN

## 2017-01-24 MED ORDER — PRO-STAT SUGAR FREE PO LIQD: 30.0000 mL | Freq: Two times a day (BID) | ORAL | Status: DC

## 2017-01-24 MED ORDER — ORAL CARE MOUTH RINSE
15.0000 mL | Freq: Four times a day (QID) | OROMUCOSAL | Status: DC
  Administered 2017-01-24 – 2017-02-04 (×43): 15 mL via OROMUCOSAL

## 2017-01-24 NOTE — Progress Notes (Signed)
Care currently managed by CCM.   We will be happy to resume care when [name] returns to our service.  Still intubated/sedated.   Nursing reported no specific overnight events of concern, CCM and thoracic surgery to discuss further anticoagulation via chest tube.

## 2017-01-24 NOTE — Progress Notes (Signed)
Initial Nutrition Assessment  DOCUMENTATION CODES:   Severe malnutrition in context of chronic illness  INTERVENTION:    Vital AF 1.2 at 20 ml/h, increase by 10 ml every 8 hours to goal rate of 60 ml/h (1440 ml per day)  Provides 1728 kcal, 108 gm protein, 1168 ml free water daily  Monitor magnesium, potassium, and phosphorus daily for at least 3 days, MD to replete as needed, as pt is at risk for refeeding syndrome given severe PCM.  NUTRITION DIAGNOSIS:   Malnutrition (severe) related to chronic illness (dysphagia, dental abscess) as evidenced by severe depletion of body fat, severe depletion of muscle mass.  GOAL:   Patient will meet greater than or equal to 90% of their needs  MONITOR:   Vent status, TF tolerance, Skin, Labs, I & O's  REASON FOR ASSESSMENT:   Consult Enteral/tube feeding initiation and management  ASSESSMENT:   71 yo male who was just discharged on 8/3 (AMS & R sided empyema) to SNF. He has PMH of severe malnutrition, tobacco abuse, recent tooth extractions & I&D for dental abscess. He was re-admitted on 8/3 with respiratory distress.    Patient is currently intubated on ventilator support MV: 7.6 L/min Temp (24hrs), Avg:99.1 F (37.3 C), Min:97.6 F (36.4 C), Max:101.4 F (38.6 C)  Patient was on a dysphagia 1 diet with nectar thick liquids at discharge on 8/3.   Received MD Consult for TF initiation and management. Nutrition-Focused physical exam completed. Findings are severe fat depletion, severe muscle depletion, and mild edema.  Labs and medications reviewed. Patient with severe PCM and is at increased risk for refeeding syndrome with initiation of nutrition support. Will increase TF rate slowly and check magnesium and phosphorus x 3 days to determine if repletion is needed.   Diet Order:  Diet NPO time specified  Skin:  Wound (see comment) (stage II sacrum)  Last BM:  8/4  Height:   Ht Readings from Last 1 Encounters:  01/22/17 5'  9" (1.753 m)   Weight:   Wt Readings from Last 1 Encounters:  01/22/17 141 lb 1.5 oz (64 kg)    Ideal Body Weight:  72.7 kg  BMI:  Body mass index is 20.84 kg/m.  Estimated Nutritional Needs:   Kcal:  1814  Protein:  100-115 gm  Fluid:  2 L  EDUCATION NEEDS:   No education needs identified at this time  Joaquin CourtsKimberly Chadwin Fury, RD, LDN, CNSC Pager [phone] After Hours Pager [phone]

## 2017-01-24 NOTE — Plan of Care (Signed)
Problem: Education: Goal: Knowledge of [facility] Education information/materials will improve Outcome: Not Progressing Discussed disease process and importance of ET tube. Needs reinforcement.

## 2017-01-24 NOTE — Progress Notes (Addendum)
Pt self extubated. Prior to self extubation patient resting comfortably in the bed, no signs of agitation or distress. SATs at 100% on roomair no signs of distress, patient following commands but lethargic, [facility] notified. DR. Carlota RaspberryScatliffe was called to come re-intubate. Patient re-intubated [facility]. MD does not want to restrain the patient but will put sedation orders in. Will continue to monitor.

## 2017-01-24 NOTE — Progress Notes (Signed)
We appreciate the level of care being provided by the CCM team, we are happy to resume care management when [name] leaves their service.  [name] seen bedside in AM.  Chest tube drain still in place/on vent.   Will continue to follow but management currently per CCM.

## 2017-01-24 NOTE — Plan of Care (Signed)
Problem: Respiratory: Goal: Ability to maintain a clear airway and adequate ventilation will improve Outcome: Progressing Positioning for optimal respiratory function maintained.

## 2017-01-24 NOTE — Plan of Care (Signed)
Problem: Skin Integrity: Goal: Risk for impaired skin integrity will decrease Outcome: Progressing Patient turned q 2 hours, foam on sacrum.

## 2017-01-24 NOTE — Progress Notes (Signed)
PULMONARY / CRITICAL CARE MEDICINE   Name: Jay Stephens MRN: [id number] DOB: September 20, 1945    ADMISSION DATE:  02/04/2017 CONSULTATION DATE:  01/22/2013  REFERRING MD:  Dr. Roxan Hockey / CVTS  CHIEF COMPLAINT:  Complicated Right Pleural Space  HISTORY OF PRESENT ILLNESS:  71 y.o. debilitated man w hx malnutrition, HTN and diastolic dysfxn, recent aspiration PNA c/b S viridans R empyema. This was drained by chest tube with improvement but some residual fluid pockets of fluid by CT chest 01/16/17. He was discharged to SNF on 8/3 but returned later the same day with encephalopathy, dyspnea, hypoxemia. Treated with broad spectrum Abx and transiently required BiPAP. Repeat CT scan 8/4 shows multiple large fluid collections largest posterior R chest, other possibly contiguous collections. TCTS and PCCM consulted to help w management.   SUBJECTIVE:  Patient self-extubated overnight & was reintubated. Patient more sedated today.   REVIEW OF SYSTEMS:  Unable to obtain with patient intubated & sedated.  VITAL SIGNS: BP 101/66   Pulse (!) 101   Temp 99.9 F (37.7 C) (Axillary)   Resp 14   Ht _0  (1.753 m)   Wt 141 lb 1.5 oz (64 kg)   SpO2 100%   BMI 20.84 kg/m   HEMODYNAMICS:    VENTILATOR SETTINGS: Vent Mode: PRVC FiO2 (%):  [50 %-100 %] 50 % Set Rate:  [14 bmp-16 bmp] 14 bmp Vt Set:  [530 mL-560 mL] 560 mL PEEP:  [5 cmH20] 5 cmH20 Plateau Pressure:  [16 cmH20-17 cmH20] 16 cmH20  INTAKE / OUTPUT: I/O last 3 completed shifts: In: 154 [P.O.:240; I.V.:127; IV Piggyback:550] Out: 0086 [PYPPJ:0932; Chest Tube:220]  PHYSICAL EXAMINATION: General:  Sedated. No acute distress. No family at bedside.  Integument:  Warm & dry. No rash on exposed skin. No bruising on exposed skin. Extremities:  No cyanosis or clubbing.  HEENT:  Moist mucus membranes. No scleral injection or icterus. Endotracheal tube in place.  Cardiovascular:  Regular rate. No edema. No appreciable JVD.  Pulmonary:  Symmetric  chest wall expansion on ventilator. Coarse breath sounds bilaterally.. Abdomen: Soft. Hypoactive bowel sounds. Nondistended. Neurological: Pupils symmetric. Heavily sedated. No spontaneous movements.  LABS:  BMET  Recent Labs Lab 01/22/17 0350 01/23/17 0244 01/24/17 0544  NA 136 137 138  K 4.5 4.2 3.8  CL 105 104 103  CO2 _1 BUN _2 CREATININE 0.82 0.86 0.79  GLUCOSE 257* 217* 87    Electrolytes  Recent Labs Lab 02/06/2017 1227  01/22/17 0350 01/23/17 0244 01/24/17 0544  CALCIUM  --   < > 7.2* 7.5* 7.2*  MG 1.4*  --   --  1.6* 1.8  PHOS 2.7  --   --  2.6  --   < > = values in this interval not displayed.  CBC  Recent Labs Lab 01/23/17 0244 01/23/17 1614 01/24/17 0544  WBC 7.2 6.2 5.1  HGB 8.1* 7.7* 7.8*  HCT 25.9* 24.7* 25.4*  PLT 177 140* 179    Coag's  Recent Labs Lab 01/22/17 0350 01/24/17 0544  APTT 34  --   INR 0.98 1.06    Sepsis Markers  Recent Labs Lab 02/12/2017 1939 01/22/17 0002 01/22/17 0005 01/22/17 0350  LATICACIDVEN 5.59* 1.5 1.51  --   PROCALCITON  --   --   --  2.90    ABG  Recent Labs Lab 02/15/2017 1939  PHART 7.375  PCO2ART 39.1  PO2ART 57.0*    Liver Enzymes  Recent Labs Lab 01/22/2017 1937  AST 47*  ALT 14*  ALKPHOS 102  BILITOT 0.9  ALBUMIN 1.3*    Cardiac Enzymes No results for input(s): TROPONINI, PROBNP in the last 168 hours.  Glucose  Recent Labs Lab 01/23/17 0756 01/23/17 1148 01/23/17 1301 01/23/17 1554 01/24/17 0441 01/24/17 0813  GLUCAP 134* 171* 181* 158* 83 86    Imaging Dg Chest Port 1 View  Result Date: 01/24/2017 CLINICAL DATA:  Follow-up examination for empyema. EXAM: PORTABLE CHEST 1 VIEW COMPARISON:  Prior radiograph from 01/23/2017. FINDINGS: Patient remains intubated with the tip of an endotracheal tube positioned 4.1 cm above the carina. Enteric tube courses in the the abdomen. Cardiac and mediastinal silhouettes are stable. Right-sided chest tube remains in  place with tip overlying the medial right upper lobe. Persistent but improved airspace opacities involving the right lung with improved aeration as compared to previous exam. Secondary improved definition of complex loculated right pleural collection, overall felt to be slightly decreased in size. Suspected small left pleural effusion. No appreciable pneumothorax. Osseous structures unchanged. IMPRESSION: 1. Continued slight interval decrease in size of complex and high edema involving the right lung with right-sided chest tube in place. Associated slightly improved aeration of the right lung as compared to previous. 2. Remaining support apparatus in satisfactory position. Electronically Signed   By: Jeannine Boga M.D.   On: 01/24/2017 06:52   Dg Chest Port 1 View  Result Date: 01/23/2017 CLINICAL DATA:  Right-sided chest tube.  Respiratory failure. EXAM: PORTABLE CHEST 1 VIEW COMPARISON:  01/23/2017 at 1214 hours FINDINGS: The tip of an endotracheal tube is 4.4 cm above the carina in satisfactory position. Right-sided chest tube tip projects up to the posterior right fifth rib level with side port at the seventh rib level. Slight improvement in aeration of the right upper lobe since prior. Otherwise, confluent peripheral airspace opacities persist along the right hemithorax. Compensatory hyperinflation of the left lung. The included heart and mediastinal contours are stable. IMPRESSION: Some interval decrease in presumed empyema at the right lung apex with right-sided chest tube in place. Satisfactory endotracheal tube position. Electronically Signed   By: Ashley Royalty M.D.   On: 01/23/2017 23:07   Dg Chest Port 1 View  Result Date: 01/23/2017 CLINICAL DATA:  Hypoxia, blood in airway, post tube placement. Images viewed by Doctor and ok'd EXAM: PORTABLE CHEST 1 VIEW COMPARISON:  Chest x-ray from earlier same day. FINDINGS: Endotracheal tube has been placed with tip well positioned just above the level of  the carina. Right-sided chest tube is stable in position with tip directed towards the medial upper lobe. Right lung opacities are stable. Left lung remains clear. Cardiomediastinal silhouette appears grossly stable in size and configuration. IMPRESSION: 1. Endotracheal tube well positioned with tip just above the level of the carina. 2. Right lung opacities are unchanged in the short-term interval, compatible with the multiple presumed empyemas demonstrated on chest CT of 01/22/2017. Right-sided chest tube is stable in position. Electronically Signed   By: Franki Cabot M.D.   On: 01/23/2017 12:35   Dg Abd Portable 1v  Result Date: 01/23/2017 CLINICAL DATA:  Orogastric tube placement EXAM: PORTABLE ABDOMEN - 1 VIEW COMPARISON:  None. FINDINGS: The tip and side port of a gastric tube are seen in the expected location of the stomach. Scattered air containing small and large bowel loops are noted without obstruction. Atelectasis and partially visualized loculated effusions are noted at the right lung base. Clear left lung base. No acute nor suspicious osseous abnormalities.  IMPRESSION: Gastric tube in the expected location of the stomach. Electronically Signed   By: Ashley Royalty M.D.   On: 01/23/2017 23:09   Dg Abd Portable 1v  Result Date: 01/23/2017 CLINICAL DATA:  Evaluate for orogastric tube placement EXAM: PORTABLE ABDOMEN - 1 VIEW COMPARISON:  01/23/2017 FINDINGS: Orogastric tube is in place, tip overlying the level of stomach. Patient has dual right-sided chest tubes, partially imaged. Persistent pleural and parenchymal opacities are identified at the right lung base, stable in appearance. Bowel gas pattern is nonobstructed. IMPRESSION: Orogastric tube to the level of the stomach. Electronically Signed   By: Nolon Nations M.D.   On: 01/23/2017 20:47   Dg Abd Portable 1v  Result Date: 01/23/2017 CLINICAL DATA:  71 y/o  M; enteric tube placement. EXAM: PORTABLE ABDOMEN - 1 VIEW COMPARISON:  01/23/2017  chest radiograph FINDINGS: The enteric tube tip projects over the lower esophagus, advancement is recommended. Endotracheal tube is 4.3 cm from carina. Right-sided chest tubes. Right pleural effusion and opacification of right lung. Partially visualized bowel gas pattern is normal. IMPRESSION: Enteric tube tip projects over distal esophagus, advancement is recommended. These results will be called to the ordering clinician or representative by the Radiologist Assistant, and communication documented in the PACS or zVision Dashboard. Electronically Signed   By: Kristine Garbe M.D.   On: 01/23/2017 18:54     STUDIES:  CT CHEST W/ CONTRAST 7/29: IMPRESSION: 1. Significantly improved right-sided empyema. Small pockets of residual fluid are seen along the lateral chest wall and fissures. Pneumothorax is small and stable from chest x-ray earlier today. 2. Pneumonia and atelectasis on the right, greatest in the lower lobe. 3. Diminished concern for obstructing airway lesion when compared to admission CT, when there was bronchial distortion due to the pleural effusion. Recommend three-month follow-up CT in this smoker with nonspecific adenopathy. 4. Small upper abdominal ascites that is newly seen. 5. Aortic Atherosclerosis (ICD10-I70.0) and Emphysema (ICD10-J43.9). 6. 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 PORT CXR 7/30:  Previously reviewed by me. Minimal residual right pleural effusion. Minimal residual right pneumothorax. Right-sided chest tube directed caudally. PORT CXR 7/31:  Previously reviewed by me. Small right apical pneumothorax. No appreciable change compared with chest x-ray imaging from last evening. No new focal opacity appreciated. No clear evolution of or development of right pleural effusion. CT CHEST W/ CONTRAST 8/4: IMPRESSION: 1. Large complex collections of fluid and air throughout the right chest, locations and measurements given above,  largest collection along the posterior right chest measuring over 20 cm greatest dimension, presumed empyemas. These findings represent a significant worsening compared to the earlier chest CT of 01/16/2017. These collections appear to be contiguous but are also likely multiloculated to some degree. Right-sided chest tube has been removed in the interval. 2. Moderate-sized left pleural effusion. 3. Mediastinal and perihilar lymphadenopathy is likely reactive in nature. 4. Upper abdominal ascites, incompletely imaged. PORT CXR 8/5:  Personally reviewed by me. Right-sided chest tube in good position. Endotracheal tube in good position. Enteric feeding tube coursing below diaphragm. Right lung opacities persistent.  MICROBIOLOGY: MRSA PCR 7/24:  Negative Blood Cultures x2 7/24:  Negative  Right BAL 7/24:  Multiple Organisms Right Pleural Fluid culture 7/24:  Viridans streptococci  MRSA PCR 8/1:  Negative Blood Cultures x2 8/3 >>> Urine Culture 8/3:  Negative  MRSA PCR 8/4:  Negative   ANTIBIOTICS: Vancomycin 7/24 - 7/25 Zosyn 7/24 - 7/28 Ancef 7/28 - 8/3 Zosyn 8/3 >>> Vancomycin  8/3 >>>  SIGNIFICANT EVENTS: 7/23 - admit to FMTS 7/24 - rapid response, intubated, bronchoscopy, R chest tube placed with ~3L milky yellow pus drained 7/29 - chest tube to water seal w/ small residual pneumothorax 7/30 - chest tube removed w/ small residual right pneumothorax 8/03 - discharged & returned promptly  8/05 - self-extubated & was reintubated w/ 7.5 ETT from 8.0 ETT  LINES/TUBES: OETT 7.5 8/5 >>> OGT 8/5 >>> R CHEST TUBE 8/5 >>> PIV  DISCUSSION:  71 y.o. male with protein-calorie malnutrition readmitted with right sided complicated parapneumonic effusion/empyema and Streptococcus pneumonia. Status post intubation for acute respiratory failure with hemoptysis. Holding on further intrapleural lytic therapy.  ASSESSMENT / PLAN:  PULMONARY A: Acute Hypoxic Respiratory Failure Hemoptysis -  Occurred with intrapleural lytic therapy.  Right Empyema - S/P Chest tube & removal w/ tube now replaced.  P:   Full Ventilator Support Chest Tube to Suction Appreciate assistance from CVTS Holding on intra-pleural lytic therapy w/ hemoptysis   CARDIOVASCULAR A:  No acute issues.  P:  Monitoring patient on telemetry Vitals per Unit Protocol  RENAL A:   No acute issues.  P:   Monitoring UOP Trending renal function & electrolytes daily   GASTROINTESTINAL A:   Moderate Protein-Calorie Malnutrition  P:   NPO Protonix IV q24hr Tube feedings per dietary recommendations  HEMATOLOGIC A:   Anemia - Multifactorial.  P:  Trending cell counts daily w/ CBC SCDs Plan to Transfuse for Hgb <7.0 or active bleeding  INFECTIOUS A:   Streptococcus Pneumonia w/ Empyema  Sepsis  P:   Currently on Day #3 of Vancomycin & Zosyn Awaiting culture results Trending Procalcitonin per Algorithm   ENDOCRINE A:   Hyperglycemia - No h/o DM.    P:   Accu-Checks q4hr  Standard scale insulin algorithm  NEUROLOGIC A:   Sedation on Ventilator Chest Pain with Chest Tube  P:   RASS goal: 0 to -1 Versed IV q2hr prn Fentanyl gtt & IV prn breakthrough   FAMILY  - Updates: No family at bedside at the time of my exam.   - Inter-disciplinary family meet or Palliative Care meeting due by:  8/10  I have spent a total of 31 minutes of critical care time today caring for the patient and reviewing the patient's electronic medical record.   Sonia Baller Ashok Cordia, M.D.  Pulmonary & Critical Care Pager:  [phone] After 3pm or if no response, call [phone] 01/24/2017, 8:22 AM

## 2017-01-24 NOTE — Progress Notes (Signed)
      [address]       [place], [id number]             [phone]      Intubated and sedated  BP 137/85 (BP Location: Right Arm)   Pulse (!) 124   Temp (!) 101.4 F (38.6 C) (Rectal) Comment: MD notified  Resp (!) 6   Ht 5\' 9"  (1.753 m)   Wt 141 lb 1.5 oz (64 kg)   SpO2 99%   BMI 20.84 kg/m    Intake/Output Summary (Last 24 hours) at 01/24/17 1340 Last data filed at 01/24/17 1200  Gross per 24 hour  Intake           714.88 ml  Output             1230 ml  Net          -515.12 ml   No further drainage from chest tube  Will d/w CCM whether to consider another trial of thrombolytics before extubated  Viviann SpareSteven C. Dorris FetchHendrickson, MD [facility] [phone]

## 2017-01-25 ENCOUNTER — Inpatient Hospital Stay (HOSPITAL_COMMUNITY): Payer: Medicare Other

## 2017-01-25 LAB — URINE CULTURE
Culture: NO GROWTH
Special Requests: NORMAL

## 2017-01-25 LAB — CBC
HEMATOCRIT: 22.4 % — AB (ref 39.0–52.0)
HEMOGLOBIN: 6.9 g/dL — AB (ref 13.0–17.0)
MCH: 26.1 pg (ref 26.0–34.0)
MCHC: 30.8 g/dL (ref 30.0–36.0)
MCV: 84.8 fL (ref 78.0–100.0)
Platelets: 192 10*3/uL (ref 150–400)
RBC: 2.64 MIL/uL — ABNORMAL LOW (ref 4.22–5.81)
RDW: 15.4 % (ref 11.5–15.5)
WBC: 7.7 10*3/uL (ref 4.0–10.5)

## 2017-01-25 LAB — MAGNESIUM: MAGNESIUM: 1.8 mg/dL (ref 1.7–2.4)

## 2017-01-25 LAB — GLUCOSE, CAPILLARY
GLUCOSE-CAPILLARY: 82 mg/dL (ref 65–99)
GLUCOSE-CAPILLARY: 140 mg/dL — AB (ref 65–99)
GLUCOSE-CAPILLARY: 111 mg/dL — AB (ref 65–99)
Glucose-Capillary: 74 mg/dL (ref 65–99)
Glucose-Capillary: 111 mg/dL — ABNORMAL HIGH (ref 65–99)
Glucose-Capillary: 120 mg/dL — ABNORMAL HIGH (ref 65–99)
Glucose-Capillary: 166 mg/dL — ABNORMAL HIGH (ref 65–99)

## 2017-01-25 LAB — RENAL FUNCTION PANEL
ANION GAP: 7 (ref 5–15)
Albumin: 1.3 g/dL — ABNORMAL LOW (ref 3.5–5.0)
BUN: 15 mg/dL (ref 6–20)
CALCIUM: 7.3 mg/dL — AB (ref 8.9–10.3)
CO2: 27 mmol/L (ref 22–32)
Chloride: 106 mmol/L (ref 101–111)
Creatinine, Ser: 1.16 mg/dL (ref 0.61–1.24)
GFR calc Af Amer: >60 mL/min (ref >60)
GFR calc non Af Amer: >60 mL/min (ref >60)
GLUCOSE: 127 mg/dL — AB (ref 65–99)
POTASSIUM: 4.7 mmol/L (ref 3.5–5.1)
Phosphorus: 4 mg/dL (ref 2.5–4.6)
SODIUM: 140 mmol/L (ref 135–145)

## 2017-01-25 LAB — VANCOMYCIN, TROUGH: VANCOMYCIN TR: 27 ug/mL — AB (ref 15–20)

## 2017-01-25 LAB — PROCALCITONIN: Procalcitonin: 1.92 ng/mL

## 2017-01-25 LAB — HEMOGLOBIN AND HEMATOCRIT, BLOOD
HEMATOCRIT: 22.8 % — AB (ref 39.0–52.0)
Hemoglobin: 6.9 g/dL — CL (ref 13.0–17.0)

## 2017-01-25 MED ORDER — HEPARIN SODIUM (PORCINE) 5000 UNIT/ML IJ SOLN
5000.0000 [IU] | Freq: Three times a day (TID) | INTRAMUSCULAR | Status: DC
  Administered 2017-01-25 – 2017-02-06 (×36): 5000 [IU] via SUBCUTANEOUS
  Filled 2017-01-25 (×36): qty 1

## 2017-01-25 NOTE — Progress Notes (Signed)
PULMONARY / CRITICAL CARE MEDICINE   Name: Jay Stephens MRN: [id number] DOB: 1946-05-03    ADMISSION DATE:  02/12/2017 CONSULTATION DATE:  01/22/2013  REFERRING MD:  Dr. Roxan Hockey / CVTS  CHIEF COMPLAINT:  Complicated Right Pleural Space  HISTORY OF PRESENT ILLNESS:  71 y.o. debilitated man w hx malnutrition, HTN and diastolic dysfxn, recent aspiration PNA c/b S viridans R empyema. This was drained by chest tube with improvement but some residual fluid pockets of fluid by CT chest 01/16/17. He was discharged to SNF on 8/3 but returned later the same day with encephalopathy, dyspnea, hypoxemia. Treated with broad spectrum Abx and transiently required BiPAP. Repeat CT scan 8/4 shows multiple large fluid collections largest posterior R chest, other possibly contiguous collections. TCTS and PCCM consulted to help w management.   SUBJECTIVE:  No issues overnight. Patient was reaching towards his tube & required restraints overnight. Patient had no effort today during his SBT today. Minimal blood-tinged secretion with suctioning.   REVIEW OF SYSTEMS:  Unable to obtain with patient intubated & sedated.  VITAL SIGNS: BP 122/83   Pulse (!) 108   Temp 100.1 F (37.8 C) (Axillary)   Resp 14   Ht _0  (1.753 m)   Wt 140 lb 3.4 oz (63.6 kg)   SpO2 100%   BMI 20.71 kg/m   HEMODYNAMICS:    VENTILATOR SETTINGS: Vent Mode: PRVC FiO2 (%):  [40 %-50 %] 40 % Set Rate:  [14 bmp] 14 bmp Vt Set:  [560 mL] 560 mL PEEP:  [5 cmH20] 5 cmH20 Plateau Pressure:  [17 cmH20-19 cmH20] 19 cmH20  INTAKE / OUTPUT: I/O last 3 completed shifts: In: 1341.4 [I.V.:337.1; NG/GT:404.3; IV Piggyback:600] Out: 1791 [TAVWP:7948; Chest Tube:30]  PHYSICAL EXAMINATION: General:  Eyes open. No family at bedside. No distress. Integument:  Warm. Dry. No rash. Extremities:  No cyanosis or clubbing.  HEENT:  No scleral icterus. Endotracheal tube in place.  Cardiovascular:  Regular rhythm. Regular rate. No JVD. Pulmonary:   Coarse breath sounds. Symmetric chest wall expansion on ventilator. Abdomen: Soft. Normal bowel sounds.  Neurological: Eyes open & moving extremities spontaneously.   LABS:  BMET  Recent Labs Lab 01/23/17 0244 01/24/17 0544 01/25/17 0523  NA 137 138 140  K 4.2 3.8 4.7  CL 104 103 106  CO2 _1 BUN _2 CREATININE 0.86 0.79 1.16  GLUCOSE 217* 87 127*    Electrolytes  Recent Labs Lab 02/11/2017 1227  01/23/17 0244 01/24/17 0544 01/25/17 0523  CALCIUM  --   < > 7.5* 7.2* 7.3*  MG 1.4*  --  1.6* 1.8 1.8  PHOS 2.7  --  2.6  --  4.0  < > = values in this interval not displayed.  CBC  Recent Labs Lab 01/24/17 0544 01/24/17 1637 01/25/17 0616  WBC 5.1 7.8 7.7  HGB 7.8* 7.2* 6.9*  HCT 25.4* 23.6* 22.4*  PLT 179 196 192    Coag's  Recent Labs Lab 01/22/17 0350 01/24/17 0544  APTT 34  --   INR 0.98 1.06    Sepsis Markers  Recent Labs Lab 01/20/2017 1939 01/22/17 0002 01/22/17 0005 01/22/17 0350 01/24/17 0858 01/25/17 0523  LATICACIDVEN 5.59* 1.5 1.51  --   --   --   PROCALCITON  --   --   --  2.90 2.66 1.92    ABG  Recent Labs Lab 01/27/2017 1939  PHART 7.375  PCO2ART 39.1  PO2ART 57.0*    Liver Enzymes  Recent Labs Lab 02/13/2017 1937 01/25/17 0523  AST 47*  --   ALT 14*  --   ALKPHOS 102  --   BILITOT 0.9  --   ALBUMIN 1.3* 1.3*    Cardiac Enzymes No results for input(s): TROPONINI, PROBNP in the last 168 hours.  Glucose  Recent Labs Lab 01/24/17 0813 01/24/17 1104 01/24/17 1454 01/24/17 2013 01/24/17 2318 01/25/17 0309  GLUCAP 86 117* 116* 162* 145* 111*    Imaging No results found.   STUDIES:  CT CHEST W/ CONTRAST 7/29: IMPRESSION: 1. Significantly improved right-sided empyema. Small pockets of residual fluid are seen along the lateral chest wall and fissures. Pneumothorax is small and stable from chest x-ray earlier today. 2. Pneumonia and atelectasis on the right, greatest in the lower lobe. 3.  Diminished concern for obstructing airway lesion when compared to admission CT, when there was bronchial distortion due to the pleural effusion. Recommend three-month follow-up CT in this smoker with nonspecific adenopathy. 4. Small upper abdominal ascites that is newly seen. 5. Aortic Atherosclerosis (ICD10-I70.0) and Emphysema (ICD10-J43.9). 6. 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 PORT CXR 7/30:  Previously reviewed by me. Minimal residual right pleural effusion. Minimal residual right pneumothorax. Right-sided chest tube directed caudally. PORT CXR 7/31:  Previously reviewed by me. Small right apical pneumothorax. No appreciable change compared with chest x-ray imaging from last evening. No new focal opacity appreciated. No clear evolution of or development of right pleural effusion. CT CHEST W/ CONTRAST 8/4: IMPRESSION: 1. Large complex collections of fluid and air throughout the right chest, locations and measurements given above, largest collection along the posterior right chest measuring over 20 cm greatest dimension, presumed empyemas. These findings represent a significant worsening compared to the earlier chest CT of 01/16/2017. These collections appear to be contiguous but are also likely multiloculated to some degree. Right-sided chest tube has been removed in the interval. 2. Moderate-sized left pleural effusion. 3. Mediastinal and perihilar lymphadenopathy is likely reactive in nature. 4. Upper abdominal ascites, incompletely imaged. PORT CXR 8/5:  Personally reviewed by me. Right-sided chest tube in good position. Endotracheal tube in good position. Enteric feeding tube coursing below diaphragm. Right lung opacities persistent. CT CHEST W/O 8/7 >>>  MICROBIOLOGY: MRSA PCR 7/24:  Negative Blood Cultures x2 7/24:  Negative  Right BAL 7/24:  Multiple Organisms Right Pleural Fluid culture 7/24:  Viridans streptococci  MRSA PCR 8/1:   Negative Blood Cultures x2 8/3 >>> Urine Culture 8/3:  Negative  MRSA PCR 8/4:  Negative  Tracheal Aspirate Culture 8/6 >>> Urine Culture 8/6 >>> Blood Cultures x2 8/6 >>>  ANTIBIOTICS: Vancomycin 7/24 - 7/25 Zosyn 7/24 - 7/28 Ancef 7/28 - 8/3 Zosyn 8/3 >>> Vancomycin 8/3 >>>  SIGNIFICANT EVENTS: 7/23 - admit to FMTS 7/24 - rapid response, intubated, bronchoscopy, R chest tube placed with ~3L milky yellow pus drained 7/29 - chest tube to water seal w/ small residual pneumothorax 7/30 - chest tube removed w/ small residual right pneumothorax 8/03 - discharged & returned promptly  8/05 - self-extubated & was reintubated w/ 7.5 ETT from 8.0 ETT 8/06 - febrile >> recultured   LINES/TUBES: OETT 7.5 8/5 >>> OGT 8/5 >>> R CHEST TUBE 8/5 >>> PIV  DISCUSSION:  72 y.o. male with protein calorie malnutrition and empyema secondary to Streptococcus pneumonia. Disposition remains uncertain pending resolution of patient's empyema.  ASSESSMENT / PLAN:  PULMONARY A: Acute Hypoxic Respiratory Failure Hemoptysis - Occurred with intrapleural lytic therapy.  Right  Empyema - S/P Chest tube & removal w/ tube now replaced. Apnea - No respiratory effort. Possibly secondary to Fentanyl drip.   P:   Continuing full ventilator support Continuing chest tube to suction Checking CT chest w/o to consider placement of chest tube Appreciate thoracic surgery assistance Daily spontaneous breathing trial/pressure support wean Repeat chest x-ray tomorrow morning Consider intrapleural lytic therapy versus placement of additional chest tube upon CT result  CARDIOVASCULAR A:  No acute issues.  P:  Monitoring patient on telemetry Vitals per Unit Protocol  RENAL A:   No acute issues.  P:   Monitoring UOP Trending renal function & electrolytes daily   GASTROINTESTINAL A:   Moderate Protein-Calorie Malnutrition  P:   NPO Continuing Protonix IV q24hr Tube feedings per dietary  recommendations  HEMATOLOGIC A:   Anemia - Multifactorial. Slightly worse today.   P:  Repeat Hgb/Hct at 4pm today  Starting Heparin [place] q8hr for DVT prophylaxis  Continuing SCDs Plan to transfuse for Hgb <7.0 or active bleeding  INFECTIOUS A:   Streptococcus Pneumonia w/ Empyema  Sepsis  P:   Currently on Day #4 of Vancomycin & Zosyn Awaiting culture results Trending Procalcitonin per Algorithm   ENDOCRINE A:   Hyperglycemia - No h/o DM.    P:   Accu-Checks q4hr  Standard scale insulin algorithm  NEUROLOGIC A:   Sedation on Ventilator Chest Pain with Chest Tube  P:   RASS goal: 0 to -1 Versed IV q2hr prn Holding Fentanyl gtt  Continuing Fentanyl IV prn breakthrough   FAMILY  - Updates: No family at bedside 8/7 during rounds.   - Inter-disciplinary family meet or Palliative Care meeting due by:  8/10  I have spent a total of 33 minutes of critical care time today caring for the patient and reviewing the patient's electronic medical record.   Sonia Baller Ashok Cordia, M.D. [facility] Pager:  [phone] After 3pm or if no response, call [phone] 01/25/2017, 7:43 AM

## 2017-01-25 NOTE — Care Management Note (Signed)
Case Management Note  Patient Details  Name: Jay HibbsJessie Zeitlin MRN: [id number] Date of Birth: 06/25/1945  Subjective/Objective:    Pt admitted with aspiration and right empyema - requiring ventilator and chest tube for draiinage               Action/Plan:  [facility] - readmitted within a day with current symptoms.  PTA to SNF - pt was from home with his nephew [name](POA).  Palliative involved in last stay.  CM will continue to follow for discharge needs   Expected Discharge Date:                  Expected Discharge Plan:  Skilled Nursing Facility  In-House Referral:  Clinical Social Work  Discharge planning Services  CM Consult  Post Acute Care Choice:    Choice offered to:     DME Arranged:    DME Agency:     HH Arranged:    HH Agency:     Status of Service:     If discussed at [employer] Meetings, dates discussed:    Additional Comments:  Cherylann ParrClaxton, Damany Eastman S, RN 01/25/2017, 9:07 AM

## 2017-01-25 NOTE — Progress Notes (Signed)
Pt transported on vent to CT and returned to 2M[facility]. Pt's vitals remained stable throughout the trip.

## 2017-01-25 NOTE — Progress Notes (Signed)
Pharmacy Antibiotic Note  Jay Stephens is a 71 y.o. male admitted on 02/06/2017 with pneumonia. Pharmacy was consulted for vancomycin and zosyn dosing. He was recently discharged from [facility] on 8/3 then quickly readmitted when he desaturated while eating. Over the past 24 hours he has become febrile, WBC bumped up to 7.8 from 5.1, but the procalcitonin is trending down.  Vancomycin trough at 0930 was supratherapeutic at 27. Creatinine 0.79>>1.16; cultures NGTD  Plan: Continue Zosyn 3.375 grams IV Q8h Hold the Vancomycin x 2 doses Recheck vancomycin on 08/08 at 0930 Monitor Cultures, LOT, clinical status  Height: 5\' 9"  (175.3 cm) Weight: 140 lb 3.4 oz (63.6 kg) IBW/kg (Calculated) : 70.7  Temp (24hrs), Avg:100.6 F (38.1 C), Min:100.1 F (37.8 C), Max:101.4 F (38.6 C)   Recent Labs Lab 02/16/2017 1937 02/10/2017 1939 01/22/17 0002 01/22/17 0005 01/22/17 0350 01/23/17 0244 01/23/17 1614 01/24/17 0544 01/24/17 1637 01/25/17 0523 01/25/17 0616 01/25/17 0856  WBC 12.9*  --   --   --  11.2* 7.2 6.2 5.1 7.8  --  7.7  --   CREATININE 0.82  --   --   --  0.82 0.86  --  0.79  --  1.16  --   --   LATICACIDVEN  --  5.59* 1.5 1.51  --   --   --   --   --   --   --   --   VANCOTROUGH  --   --   --   --   --   --   --   --   --   --   --  27*    Estimated Creatinine Clearance: 54.1 mL/min (by C-G formula based on SCr of 1.16 mg/dL).    No Known Allergies  Antimicrobials this admission: Zosyn 08/03>> Vancomycin 08/03 >>  Microbiology results: Urine cx 08/07>> NGTD Blood cx 08/07>> NGTD Resp cx 08/07>> NGTD Urine cx 08/03>> no growth; final Blood cx 08/03>> no growth x 3 days MRSA PCR >> negative  Old: body fluid culture on 07/24 showed S. Viridans sensitive to FQ's and vancomycin (no others reported)  Jay Stephens PharmD PGY1 Pharmacy Practice Resident 01/25/2017 10:26 AM Pager: [phone]

## 2017-01-25 NOTE — Progress Notes (Signed)
CRITICAL VALUE ALERT  Critical Value:  Vancomycin trough 27  Date & Time Notied:  01/25/17 0958  Provider Notified: Dr.Nestor

## 2017-01-25 NOTE — Progress Notes (Signed)
CRITICAL VALUE ALERT  Critical Value:  Hemoglobin 6.9  Date & Time Notied:  01/25/17 [id number]  Provider Notified: Dr. Jamison NeighborNestor

## 2017-01-25 NOTE — Progress Notes (Addendum)
Pt alert and very tense. Hands up by chin reaching toward ET tube. MD notified, soft wrist restraints ordered. Will continue to monitor.

## 2017-01-26 ENCOUNTER — Inpatient Hospital Stay (HOSPITAL_COMMUNITY): Payer: Medicare Other

## 2017-01-26 ENCOUNTER — Encounter (HOSPITAL_COMMUNITY): Payer: Self-pay | Admitting: *Deleted

## 2017-01-26 LAB — CULTURE, BLOOD (ROUTINE X 2)
Culture: NO GROWTH
Culture: NO GROWTH
SPECIAL REQUESTS: ADEQUATE
Special Requests: ADEQUATE

## 2017-01-26 LAB — RENAL FUNCTION PANEL
ANION GAP: 7 (ref 5–15)
Albumin: 1.3 g/dL — ABNORMAL LOW (ref 3.5–5.0)
BUN: 18 mg/dL (ref 6–20)
CHLORIDE: 107 mmol/L (ref 101–111)
CO2: 29 mmol/L (ref 22–32)
Calcium: 7.4 mg/dL — ABNORMAL LOW (ref 8.9–10.3)
Creatinine, Ser: 1.14 mg/dL (ref 0.61–1.24)
GFR calc non Af Amer: >60 mL/min (ref >60)
Glucose, Bld: 90 mg/dL (ref 65–99)
POTASSIUM: 3.4 mmol/L — AB (ref 3.5–5.1)
Phosphorus: 2.7 mg/dL (ref 2.5–4.6)
Sodium: 143 mmol/L (ref 135–145)

## 2017-01-26 LAB — PROCALCITONIN: PROCALCITONIN: 1.23 ng/mL

## 2017-01-26 LAB — GLUCOSE, CAPILLARY
GLUCOSE-CAPILLARY: 142 mg/dL — AB (ref 65–99)
GLUCOSE-CAPILLARY: 124 mg/dL — AB (ref 65–99)
Glucose-Capillary: 127 mg/dL — ABNORMAL HIGH (ref 65–99)
Glucose-Capillary: 129 mg/dL — ABNORMAL HIGH (ref 65–99)
Glucose-Capillary: 142 mg/dL — ABNORMAL HIGH (ref 65–99)
Glucose-Capillary: 78 mg/dL (ref 65–99)

## 2017-01-26 LAB — CULTURE, RESPIRATORY: CULTURE: NO GROWTH

## 2017-01-26 LAB — VANCOMYCIN, TROUGH: VANCOMYCIN TR: 15 ug/mL (ref 15–20)

## 2017-01-26 LAB — MAGNESIUM: Magnesium: 1.8 mg/dL (ref 1.7–2.4)

## 2017-01-26 LAB — CULTURE, RESPIRATORY W GRAM STAIN

## 2017-01-26 MED ORDER — PANTOPRAZOLE SODIUM 40 MG PO PACK: 40.0000 mg | PACK | Freq: Every day | ORAL | Status: DC

## 2017-01-26 MED ORDER — VANCOMYCIN HCL IN DEXTROSE 750-5 MG/150ML-% IV SOLN
750.0000 mg | INTRAVENOUS | Status: DC
  Filled 2017-01-26: qty 150

## 2017-01-26 MED ORDER — POTASSIUM CHLORIDE 20 MEQ/15ML (10%) PO SOLN
20.0000 meq | ORAL | Status: AC
  Administered 2017-01-26 (×2): 20 meq
  Filled 2017-01-26 (×2): qty 15

## 2017-01-26 MED ORDER — ACETAMINOPHEN 650 MG RE SUPP: 650.0000 mg | Freq: Four times a day (QID) | RECTAL | Status: DC | PRN

## 2017-01-26 NOTE — Progress Notes (Signed)
Pharmacy Antibiotic Note  Jay Stephens is a 71 y.o. male admitted on 02/09/2017 with pneumonia. Pharmacy was consulted for vancomycin and zosyn dosing. He was recently discharged from [facility] on 8/3 then quickly readmitted when he desaturated while eating. Afebrile today, procal trending down, WBC 7.7 from 7.8. Chest CT on 8/7 showed no improvement and the chest tube is not adequately draining the fluid. Cultures NGTD.  Vancomycin trough on Tuesday was 27, then 24 hours later (08/08) the vancomycin random returned at 15. Creatinine bumped two days ago, but has since been stable. Will restart vancomycin today.  Plan: Continue Zosyn 3.375 grams IV Q8h Restart Vancomycin at 750 mg Q24h Recheck vancomycin levels as indicated Monitor Cultures, LOT, clinical status  Height: 5\' 9"  (175.3 cm) Weight: 136 lb 7.4 oz (61.9 kg) IBW/kg (Calculated) : 70.7  Temp (24hrs), Avg:98.4 F (36.9 C), Min:98.1 F (36.7 C), Max:98.7 F (37.1 C)   Recent Labs Lab 02/08/2017 1939 01/22/17 0002 01/22/17 0005 01/22/17 0350 01/23/17 0244 01/23/17 1614 01/24/17 0544 01/24/17 1637 01/25/17 0523 01/25/17 0616 01/25/17 0856 01/26/17 0206 01/26/17 0930  WBC  --   --   --  11.2* 7.2 6.2 5.1 7.8  --  7.7  --   --   --   CREATININE  --   --   --  0.82 0.86  --  0.79  --  1.16  --   --  1.14  --   LATICACIDVEN 5.59* 1.5 1.51  --   --   --   --   --   --   --   --   --   --   VANCOTROUGH  --   --   --   --   --   --   --   --   --   --  27*  --  15    Estimated Creatinine Clearance: 53.5 mL/min (by C-G formula based on SCr of 1.14 mg/dL).    No Known Allergies  Antimicrobials this admission: Zosyn 08/03>> Vancomycin 08/03 >>  Microbiology results: Urine cx 08/07>> NGTD Blood cx 08/07>> NGTD Resp cx 08/07>> NGTD Urine cx 08/03>> no growth; final Blood cx 08/03>> no growth x 3 days MRSA PCR >> negative  Old: body fluid culture on 07/24 showed S. Viridans sensitive to FQ's and vancomycin (no others  reported)  [name] PharmD PGY1 Pharmacy Practice Resident 01/26/2017 11:34 AM Pager: [phone]

## 2017-01-26 NOTE — Progress Notes (Signed)
Pt self extubated while in bilateral wrist restraints and with mitts in place. Pt alert x's 2. C/o being hungry after extubation. RR 20 02 sats 100% on 2L post extubation. Pt denies c/o pain or SOB at this time. Dr Jamison NeighborNestor called to bedside to eval patient. OK to remain on 02. Pt placed in chair position. Nursing to continue to monitor.

## 2017-01-26 NOTE — Progress Notes (Signed)
   RT called to pt's room by RN  due to self extubation.  Pt able to speak clearly, no stridor noted, CCM notified. RT will continue to monitor.

## 2017-01-26 NOTE — Progress Notes (Signed)
Care currently managed by CCM team, we are happy to resume care coordination when he is transferred back to our service.  Patient was extubated and able to respond to verbal prompts, still not oriented although he did say he was not in pain.

## 2017-01-26 NOTE — Progress Notes (Signed)
[facility] FOR AM LAB REPLACEMENT ONLY  The patient does apply for the [facility]ly[facility] Protocol based on the criteria listed below:   1. Is GFR >/= 40 ml/min? Yes.    Patient's GFR today is >60 2. Is urine output >/= 0.5 ml/kg/hr for the last 6 hours? Yes.   Patient's UOP is 1.62 ml/kg/hr 3. Is BUN < 60 mg/dL? Yes.    Patient's BUN today is 18 4. Abnormal electrolyte(s): Potassium 3.4 5. Ordered repletion with: Potassium per protocol 6. If a panic level lab has been reported, has the CCM MD in charge been notified? No..   Physician:    Orland DecLANTZY, Nolan Lasser P 01/26/2017 4:04 AM

## 2017-01-26 NOTE — Progress Notes (Signed)
      [address]       [place] [id number]             [phone]      Self extubated again today  He is awake and responds to questions with grunts. Not able to cough when requested  BP (!) 144/86   Pulse 87   Temp 98.4 F (36.9 C) (Axillary)   Resp 14   Ht 5\' 9"  (1.753 m)   Wt 136 lb 7.4 oz (61.9 kg)   SpO2 100%   BMI 20.15 kg/m    Intake/Output Summary (Last 24 hours) at 01/26/17 1342 Last data filed at 01/26/17 1200  Gross per 24 hour  Intake           1482.5 ml  Output             1250 ml  Net            232.5 ml    Repeat CT showed tube was below and medial to majority of superior collection. I agree with Dr. Jamison NeighborNestor that additional thrombolytics through that tube are unlikely to be beneficial. Agree with plan to continue antibiotics and observe. We may want to consider an image guided posterior basal tube if condition worsens. Poor operative candidate due to cognition and high likelihood of repeat aspiration.  Salvatore DecentSteven C. Dorris FetchHendrickson, MD [facility] [phone]

## 2017-01-26 NOTE — Progress Notes (Signed)
PULMONARY / CRITICAL CARE MEDICINE   Name: Jay Stephens MRN: [id number] DOB: 1945/11/24    ADMISSION DATE:  01/20/2017 CONSULTATION DATE:  01/22/2013  REFERRING MD:  Dr. Roxan Hockey / CVTS  CHIEF COMPLAINT:  Complicated Right Pleural Space  HISTORY OF PRESENT ILLNESS:  71 y.o. debilitated man w hx malnutrition, HTN and diastolic dysfxn, recent aspiration PNA c/b S viridans R empyema. This was drained by chest tube with improvement but some residual fluid pockets of fluid by CT chest 01/16/17. He was discharged to SNF on 8/3 but returned later the same day with encephalopathy, dyspnea, hypoxemia. Treated with broad spectrum Abx and transiently required BiPAP. Repeat CT scan 8/4 shows multiple large fluid collections largest posterior R chest, other possibly contiguous collections. TCTS and PCCM consulted to help w management.   SUBJECTIVE:  Patient self extubated this morning. Denies any chest pain or difficulty breathing. No acute events overnight.  REVIEW OF SYSTEMS:  Denies any nausea or abdominal pain.  VITAL SIGNS: BP (!) 144/86   Pulse 87   Temp 98.4 F (36.9 C) (Axillary)   Resp 14   Ht _0  (1.753 m)   Wt 136 lb 7.4 oz (61.9 kg)   SpO2 100%   BMI 20.15 kg/m   HEMODYNAMICS:    VENTILATOR SETTINGS: Vent Mode: PRVC FiO2 (%):  [40 %] 40 % Set Rate:  [14 bmp] 14 bmp Vt Set:  [560 mL] 560 mL PEEP:  [5 cmH20] 5 cmH20 Plateau Pressure:  [14 cmH20-19 cmH20] 17 cmH20  INTAKE / OUTPUT: I/O last 3 completed shifts: In: 2108.4 [I.V.:235.9; NG/GT:1472.5; IV Piggyback:400] Out: 6962 [Urine:1685; Chest Tube:30]  PHYSICAL EXAMINATION: General:  Awake. No acute distress. No family at bedside.  Integument:  Warm. Dry. A right-sided chest tube in place. No rash on exposed skin. Extremities:  No cyanosis or clubbing.  HEENT: Tacky mucus membranes. No scleral injection or icterus.  Cardiovascular:  Regular rate. Unable to appreciate JVD.  Pulmonary:  Slightly diminished breath sounds  in the bases. Normal phonation post self extubation. Normal work of breathing on nasal cannula oxygen. Abdomen: Soft. Normal bowel sounds. Nondistended. Grossly nontender. Neurological: Following commands. Answers questions.  LABS:  BMET  Recent Labs Lab 01/24/17 0544 01/25/17 0523 01/26/17 0206  NA 138 140 143  K 3.8 4.7 3.4*  CL 103 106 107  CO2 _1 BUN _2 CREATININE 0.79 1.16 1.14  GLUCOSE 87 127* 90    Electrolytes  Recent Labs Lab 01/23/17 0244 01/24/17 0544 01/25/17 0523 01/26/17 0206  CALCIUM 7.5* 7.2* 7.3* 7.4*  MG 1.6* 1.8 1.8 1.8  PHOS 2.6  --  4.0 2.7    CBC  Recent Labs Lab 01/24/17 0544 01/24/17 1637 01/25/17 0616 01/25/17 1509  WBC 5.1 7.8 7.7  --   HGB 7.8* 7.2* 6.9* 6.9*  HCT 25.4* 23.6* 22.4* 22.8*  PLT 179 196 192  --     Coag's  Recent Labs Lab 01/22/17 0350 01/24/17 0544  APTT 34  --   INR 0.98 1.06    Sepsis Markers  Recent Labs Lab 02/12/2017 1939 01/22/17 0002 01/22/17 0005  01/24/17 0858 01/25/17 0523 01/26/17 0206  LATICACIDVEN 5.59* 1.5 1.51  --   --   --   --   PROCALCITON  --   --   --   < > 2.66 1.92 1.23  < > = values in this interval not displayed.  ABG  Recent Labs Lab 01/29/2017 1939  PHART 7.375  PCO2ART 39.1  PO2ART 57.0*    Liver Enzymes  Recent Labs Lab 02/09/2017 1937 01/25/17 0523 01/26/17 0206  AST 47*  --   --   ALT 14*  --   --   ALKPHOS 102  --   --   BILITOT 0.9  --   --   ALBUMIN 1.3* 1.3* 1.3*    Cardiac Enzymes No results for input(s): TROPONINI, PROBNP in the last 168 hours.  Glucose  Recent Labs Lab 01/25/17 1520 01/25/17 2042 01/26/17 0041 01/26/17 0437 01/26/17 0708 01/26/17 1108  GLUCAP 120* 166* 142* 78 142* 129*    Imaging Dg Chest Port 1 View  Result Date: 01/26/2017 CLINICAL DATA:  Acute respiratory failure. EXAM: PORTABLE CHEST 1 VIEW COMPARISON:  Chest CT 01/25/2017 and radiograph 01/24/2017 FINDINGS: Endotracheal tube terminates 3.5 cm  above the carina. Enteric tube courses into the left upper abdomen with tip not imaged. A right-sided chest tube remains in place. The cardiac silhouette is normal in size. Loculated right pleural fluid is stable to minimally decreased compared to the prior radiograph. No pneumothorax is identified. Right lung airspace opacities have mildly improved from the prior radiograph. There is a trace left pleural effusion. IMPRESSION: 1. Stable to slightly decreased volume of loculated right pleural fluid consistent with empyema. 2. Mildly improved right lung aeration. Electronically Signed   By: Logan Bores M.D.   On: 01/26/2017 08:02     STUDIES:  CT CHEST W/ CONTRAST 7/29: IMPRESSION: 1. Significantly improved right-sided empyema. Small pockets of residual fluid are seen along the lateral chest wall and fissures. Pneumothorax is small and stable from chest x-ray earlier today. 2. Pneumonia and atelectasis on the right, greatest in the lower lobe. 3. Diminished concern for obstructing airway lesion when compared to admission CT, when there was bronchial distortion due to the pleural effusion. Recommend three-month follow-up CT in this smoker with nonspecific adenopathy. 4. Small upper abdominal ascites that is newly seen. 5. Aortic Atherosclerosis (ICD10-I70.0) and Emphysema (ICD10-J43.9). 6. 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 PORT CXR 7/30:  Previously reviewed by me. Minimal residual right pleural effusion. Minimal residual right pneumothorax. Right-sided chest tube directed caudally. PORT CXR 7/31:  Previously reviewed by me. Small right apical pneumothorax. No appreciable change compared with chest x-ray imaging from last evening. No new focal opacity appreciated. No clear evolution of or development of right pleural effusion. CT CHEST W/ CONTRAST 8/4: IMPRESSION: 1. Large complex collections of fluid and air throughout the right chest, locations and  measurements given above, largest collection along the posterior right chest measuring over 20 cm greatest dimension, presumed empyemas. These findings represent a significant worsening compared to the earlier chest CT of 01/16/2017. These collections appear to be contiguous but are also likely multiloculated to some degree. Right-sided chest tube has been removed in the interval. 2. Moderate-sized left pleural effusion. 3. Mediastinal and perihilar lymphadenopathy is likely reactive in nature. 4. Upper abdominal ascites, incompletely imaged. PORT CXR 8/5:  Personally reviewed by me. Right-sided chest tube in good position. Endotracheal tube in good position. Enteric feeding tube coursing below diaphragm. Right lung opacities persistent. CT CHEST W/O 8/7: IMPRESSION: 1. Multifactorial degradation, as detailed above. 2. Similar appearance and configuration of multiple loculated right-sided pleural fluid collections, most consistent with empyemas. Small foci of pleural gas are not significantly changed.  3. Placement of a right-sided large-bore chest tube since the prior CT. The chest tube is surrounded by pulmonary parenchyma, and is likely  not sufficiently draining the pleural space. 4.  Emphysema (ICD10-J43.9). 5. Coronary artery atherosclerosis. Aortic Atherosclerosis (ICD10-I70.0). 6. Similar small left pleural effusion. 7. Upper abdominal ascites. PORT CXR 8/8:  Personally reviewed by me. Enteric feeding tube coursing below diaphragm. Endotracheal tube in good position. Right chest tube in good position. Slight improvement in right lung patchy opacification with residual pleural effusion still present.  MICROBIOLOGY: MRSA PCR 7/24:  Negative Blood Cultures x2 7/24:  Negative  Right BAL 7/24:  Multiple Organisms Right Pleural Fluid culture 7/24:  Viridans streptococci  MRSA PCR 8/1:  Negative Blood Cultures x2 8/3 >>> Urine Culture 8/3:  Negative  MRSA PCR 8/4:  Negative  Tracheal  Aspirate Culture 8/6:  Negative  Urine Culture 8/6:  Negative  Blood Cultures x2 8/6 >>>  ANTIBIOTICS: Vancomycin 7/24 - 7/25 Zosyn 7/24 - 7/28 Ancef 7/28 - 8/3 Vancomycin 8/3  - 8/8  Zosyn 8/3 >>>  SIGNIFICANT EVENTS: 7/23 - admit to [facility] 7/24 - rapid response, intubated, bronchoscopy, R chest tube placed with ~3L milky yellow pus drained 7/29 - chest tube to water seal w/ small residual pneumothorax 7/30 - chest tube removed w/ small residual right pneumothorax 8/03 - discharged & returned promptly  8/05 - self-extubated & was reintubated w/ 7.5 ETT from 8.0 ETT 8/06 - febrile >> recultured  8/08 - self-extubated despite restraints  LINES/TUBES: OETT 7.5 8/5 - 8/8 (self extubated twice now) OGT 8/5 - 8/8 R CHEST TUBE 8/5 >>> PIV  ASSESSMENT / PLAN:  PULMONARY A: Acute Hypoxic Respiratory Failure:  Self-extubated x2 - last today.  Hemoptysis - Occurred with intrapleural lytic therapy.  Right Empyema - S/P Chest tube & removal w/ tube now replaced. Apnea - No respiratory effort. Possibly secondary to Fentanyl drip.   P:   Pulmonary toilet with incentive spirometer every hour swallow 8 Weaning FiO2 for saturation greater than 90% Continuing chest tube to suction Holding on intrapleural lytic therapy Appreciate assistance from CT surgery  CARDIOVASCULAR A:  No acute issues.  P:  Monitoring patient on telemetry Vitals per Unit Protocol  RENAL A:   Hypokalemia:  Replaced.  P:   KCL 37mq via tube Trending UOP Monitoring electrolytes & renal function daily   GASTROINTESTINAL A:   Moderate Protein-Calorie Malnutrition  P:   NPO post self-extubation D/C Protonix Discontinuing Tube feedings   HEMATOLOGIC A:   Anemia - Multifactorial. Slightly worse today.   P:  Trending cell counts daily w/ CBC Heparin [place] q8hr & SCDs Plan to transfuse for Hgb <7.0 or active bleeding  INFECTIOUS A:   Streptococcus Pneumonia w/ Empyema  Sepsis  P:   Currently  on Day #5 of Vancomycin & Zosyn Stopping Vancomycin today Trending Procalcitonin per Algorithm  Awaiting finalization of cultures   ENDOCRINE A:   Hyperglycemia - No h/o DM.    P:   Accu-Checks q4hr  Insulin per standard sliding scale algorithm   NEUROLOGIC A:   Sedation on Ventilator Chest Pain with Chest Tube  P:   RASS goal: 0  Discontinuing Fentanyl & Sedatives Tylenol PR prn pain   FAMILY  - Updates: No family at bedside 8/8 during rounds.   - Inter-disciplinary family meet or Palliative Care meeting due by:  871/10 DISCUSSION:  71y.o. male with Streptococcus pneumonia and empyema. Patient self extubated today but respiratory status seems to be stable at this time. Continuing close monitoring given his history of recurrent intubation after self extubation. Holding on intrapleural lytic therapy at this  time.  I have spent a total of 31 minutes of critical care time today caring for the patient and reviewing the patient's electronic medical record.   Sonia Baller Ashok Cordia, M.D. [facility] Pager:  [phone] After 3pm or if no response, call [phone] 01/26/2017, 12:33 PM

## 2017-01-27 ENCOUNTER — Inpatient Hospital Stay (HOSPITAL_COMMUNITY): Payer: Medicare Other

## 2017-01-27 LAB — CBC WITH DIFFERENTIAL/PLATELET
Basophils Absolute: 0 10*3/uL (ref 0.0–0.1)
Basophils Relative: 1 %
EOS PCT: 0 %
Eosinophils Absolute: 0 10*3/uL (ref 0.0–0.7)
HEMATOCRIT: 23.8 % — AB (ref 39.0–52.0)
Hemoglobin: 7.3 g/dL — ABNORMAL LOW (ref 13.0–17.0)
LYMPHS PCT: 27 %
Lymphs Abs: 1.2 10*3/uL (ref 0.7–4.0)
MCH: 26.5 pg (ref 26.0–34.0)
MCHC: 30.7 g/dL (ref 30.0–36.0)
MCV: 86.5 fL (ref 78.0–100.0)
MONO ABS: 0.4 10*3/uL (ref 0.1–1.0)
MONOS PCT: 10 %
NEUTROS ABS: 2.7 10*3/uL (ref 1.7–7.7)
Neutrophils Relative %: 62 %
Platelets: 219 10*3/uL (ref 150–400)
RBC: 2.75 MIL/uL — ABNORMAL LOW (ref 4.22–5.81)
RDW: 16.4 % — AB (ref 11.5–15.5)
WBC: 4.3 10*3/uL (ref 4.0–10.5)

## 2017-01-27 LAB — GLUCOSE, CAPILLARY
GLUCOSE-CAPILLARY: 132 mg/dL — AB (ref 65–99)
GLUCOSE-CAPILLARY: 122 mg/dL — AB (ref 65–99)
GLUCOSE-CAPILLARY: 128 mg/dL — AB (ref 65–99)
GLUCOSE-CAPILLARY: 124 mg/dL — AB (ref 65–99)
Glucose-Capillary: 141 mg/dL — ABNORMAL HIGH (ref 65–99)
Glucose-Capillary: 140 mg/dL — ABNORMAL HIGH (ref 65–99)

## 2017-01-27 LAB — RENAL FUNCTION PANEL
Albumin: 1.4 g/dL — ABNORMAL LOW (ref 3.5–5.0)
Anion gap: 13 (ref 5–15)
BUN: 20 mg/dL (ref 6–20)
CALCIUM: 7.6 mg/dL — AB (ref 8.9–10.3)
CO2: 26 mmol/L (ref 22–32)
CREATININE: 1.17 mg/dL (ref 0.61–1.24)
Chloride: 108 mmol/L (ref 101–111)
Glucose, Bld: 148 mg/dL — ABNORMAL HIGH (ref 65–99)
Phosphorus: 3.6 mg/dL (ref 2.5–4.6)
Potassium: 4.4 mmol/L (ref 3.5–5.1)
SODIUM: 147 mmol/L — AB (ref 135–145)

## 2017-01-27 LAB — MAGNESIUM: Magnesium: 2 mg/dL (ref 1.7–2.4)

## 2017-01-27 NOTE — Progress Notes (Signed)
[facility] to [facility] CSW if pt is needing a new FL2 for return to the facility.     Claude MangesKierra S. Porfirio Bollier, MSW, LCSW-A Emergency Department Clinical Social Worker [phone]

## 2017-01-27 NOTE — Progress Notes (Signed)
Patient refusing CPT at this time. Would like to rest. Chest vest at bedside. RT will continue to monitor.

## 2017-01-27 NOTE — Evaluation (Signed)
Clinical/Bedside Swallow Evaluation Patient Details  Name: Jay Stephens MRN: [id number] Date of Birth: [birth year]  Today's Date: 01/27/2017 Time: SLP Start Time (ACUTE ONLY): 1429 SLP Stop Time (ACUTE ONLY): 1442 SLP Time Calculation (min) (ACUTE ONLY): 13 min  Past Medical History: History reviewed. No pertinent past medical history. Past Surgical History:  Past Surgical History:  Procedure Laterality Date  . DENTAL RESTORATION/EXTRACTION WITH X-RAY N/A 02/17/2017   Procedure: SURGICAL EXCTRACTIONS OF ABSCESSED TOOTH/TEETH;  Surgeon: Vivia Ewing, DMD;  Location: [facility] OR;  Service: Oral Surgery;  Laterality: N/A;  . INCISION AND DRAINAGE ABSCESS N/A 02/15/2017   Procedure: INCISION AND DRAINAGE ABSCESS;  Surgeon: Vivia Ewing, DMD;  Location: [facility] OR;  Service: Oral Surgery;  Laterality: N/A;   HPI:  Pt is a 71 y.o.debilitated man w hx malnutrition, HTN and diastolic dysfxn, recent aspiration PNA c/b S viridans R empyema. MBS recommended Dys 1 diet and nectar thick liquids by spoon. He was discharged to SNF on 8/3 but returned later the same day with encephalopathy, dyspnea, hypoxemia, transiently requiring BiPAP. BSE upon readmission recommended the same dysphagia diet and precautions. Repeat CT scan 8/4 shows multiple large fluid collections largest posterior R chest, other possibly contiguous collections. He was intubated on 8/5 with self-extubation and reintubation the same day. He self-extubated again on 8/8.   Assessment / Plan / Recommendation Clinical Impression  Pt is at a high risk for aspiration given his recent h/o dysphagia and silent aspiration of thin liquids following a 4-day intubation, now presenting with hoarse, low vocal quality s/p 5-day intubation with two self-extubations. He has a wet vocal quality at baseline that is concerning for reduced secretion management and his cued cough appears very weak. He does not have immediate coughing with purees, but cued delayed cough seems to  trigger a stronger, more reflexive cough that is productive of small amounts of puree. Pt is not yet ready for POs and would benefit from instrumental testing prior to starting a diet. Today is RR is elevated in the mid- to high-30s; he would benefit from additional time prior to testing. Will f/u to assess readiness. SLP Visit Diagnosis: Dysphagia, unspecified (R13.10)    Aspiration Risk  Severe aspiration risk    Diet Recommendation NPO   Medication Administration: Via alternative means    Other  Recommendations Oral Care Recommendations: Oral care QID Other Recommendations: Have oral suction available   Follow up Recommendations Skilled Nursing facility      Frequency and Duration min 2x/week  2 weeks       Prognosis Prognosis for Safe Diet Advancement: Good Barriers to Reach Goals: Cognitive deficits      Swallow Study   General HPI: Pt is a 71 y.o.debilitated man w hx malnutrition, HTN and diastolic dysfxn, recent aspiration PNA c/b S viridans R empyema. MBS recommended Dys 1 diet and nectar thick liquids by spoon. He was discharged to SNF on 8/3 but returned later the same day with encephalopathy, dyspnea, hypoxemia, transiently requiring BiPAP. BSE upon readmission recommended the same dysphagia diet and precautions. Repeat CT scan 8/4 shows multiple large fluid collections largest posterior R chest, other possibly contiguous collections. He was intubated on 8/5 with self-extubation and reintubation the same day. He self-extubated again on 8/8. Type of Study: Bedside Swallow Evaluation Previous Swallow Assessment: see HPI Diet Prior to this Study: NPO Temperature Spikes Noted: No Respiratory Status: Nasal cannula History of Recent Intubation: Yes Length of Intubations (days): 5 days (4 days during recent admission) Date extubated:  01/26/17 Behavior/Cognition: Alert;Cooperative;Confused Oral Cavity Assessment: Dry Oral Care Completed by SLP: No Oral Cavity - Dentition:  Edentulous Patient Positioning: Upright in bed Baseline Vocal Quality: Low vocal intensity;Hoarse Volitional Cough: Weak    Oral/Motor/Sensory Function     Ice Chips Ice chips: Impaired Presentation: Spoon Pharyngeal Phase Impairments: Suspected delayed Swallow   Thin Liquid Thin Liquid: Impaired Presentation: Spoon;Self Fed;Cup Pharyngeal  Phase Impairments: Cough - Immediate;Suspected delayed Swallow    Nectar Thick Nectar Thick Liquid: Not tested   Honey Thick Honey Thick Liquid: Not tested   Puree Puree: Impaired Presentation: Spoon Pharyngeal Phase Impairments: Cough - Delayed   Solid   GO   Solid: Not tested        Maxcine Hamaiewonsky, Sadia Belfiore 01/27/2017,3:01 PM  Maxcine HamLaura Paiewonsky, M.A. CCC-SLP [phone]

## 2017-01-27 NOTE — Progress Notes (Signed)
      [address]       [place] [id number]             [phone]       More alert. Denies CP and SOB  BP 119/82 (BP Location: Right Arm)   Pulse 88   Temp 97.6 F (36.4 C) (Axillary)   Resp 19   Ht 5\' 9"  (1.753 m)   Wt 132 lb 8 oz (60.1 kg)   SpO2 96%   BMI 19.57 kg/m    Intake/Output Summary (Last 24 hours) at 01/27/17 1721 Last data filed at 01/27/17 1500  Gross per 24 hour  Intake              200 ml  Output              335 ml  Net             -135 ml   Lungs decreased on right  CXR shows right effusion unchanged from yesterday  Viviann SpareSteven C. Dorris FetchHendrickson, MD [facility] [phone]

## 2017-01-27 NOTE — Progress Notes (Signed)
Family Medicine Teaching Service Daily Progress Note Intern Pager: [phone]  Patient name: Jay Stephens Medical record number: [id number] Date of birth: June 22, 1945 Age: 71 y.o. Gender: male  Primary Care Provider: System, Pcp Not In Consultants: PCCM/CSW Code Status: full  Pt. Overview and Significant events: Jay Stephens is a 71 y.o. male presenting with acute onset respiratory distress with desaturations into the 70s after aspirating on a meal at SNF. PMH is significant for right sided empyema s/p drainage via chest tube, altered mental status, R facial droop, tooth abscess s/p extraction and I&D, protein-calorie malnutrition, electrolyte abnormalities and generalized weakness.  Significant events include placement of chest tube to attempt to drain R empyema with multiple attempts to anticoagulate via CT.  Patient has self-extubated twice and failed swallow evaluation.  Patient had a fall on 8/10AM and was found by nurse sitting on the mats beside his bed, he appears to be uninjured.   Assessment and Plan: [name] is a 71 y.o. male presenting with acute onset respiratory distress with desaturations into the 70s after aspirating on a meal at SNF. PMH is significant for right sided empyema s/p drainage via chest tube, altered mental status, R facial droop, tooth abscess s/p extraction and I&D, protein-calorie malnutrition, electrolyte abnormalities and generalized weakness.  Empyema vs Pneumonia: On Admission Patient met SIRS criteria with lactic acid of 5.6, tachycardia to 134, tachypnea on CPAP was satting 80% with EMS. Patient was placed on BiPAP with 100% SpO2.  Code sepsis was initiated and patient  received 2L NS bolus and vanc/zosyn in ED.  BNP was elevated at 640, however echo from previous admission showed EF 55-60%.  WBC 12.9, a sharp increase from 5.0 this morning prior to discharge.  CXR showed new airspace disease throughout the right chest with a new right basilar effusion  concerning for new pneumonia.  ABG with pH 7.375, pO2 57, pCO2 39.  Right upper lung field rhonchorous on auscultation with minimal lung sounds heard in right lung base.  Differential includes new pneumonia or reaccumulation of empyema.  Of note, patient has an obstructing lesion of right upper lobe bronchus, so patient may have a mass there that could be contributing to his pulmonary illness.   -transferred to SDU with FMTS as primary, PCCM consulting (appreciate their input) -patient has self-extubated twice -continue zosyn -continuous pulse ox -am CBC, BMP -follow blood cultures -follow urine culture -hold fluids for now -NPO -vitals per unit routine  AMS with R-sided facial droop and weakness: Patient is alert but not oriented.  -at baseline for this visit  Tooth abscess s/p extraction and I&D: Patient denied pain in mouth.  Since the extraction occurred so recently (on 8/1), will need to be monitored for any infection or bleeding. -continue to monitor daily -chlorhexadine mouthwash TID until 8/15  Protein-calorie malnutrition: Patient is cachectic, which is likely due to a chronic illness such as cancer as well as his low function at baseline.  Nephew reported earlier that he has to be fed at home.  Albumin on admission is 1.4.  Seen by nutrition and palliative care during his recent hospitalization.  -monitor Mag, Phos, K daily for refeeding syndrome -continue to follow nutrition and palliative care recs -discuss how to maintain nutrition on AMS patient who is not intubated  Fall protection -patient was found on the floor by his bed in the AM   Electrolyte abnormalities: patient experienced hypernatremia and hyperchloremia as well as hypocalcemia during previous admission.  In the ED, Na 136,  K 4.5, Ca 7.5. -daily BMP  Sacral ulcer: -foam/turning Q2 per nursing protocol  Pain control: -tylenol prn  FEN/GI: NPO Prophylaxis: Lovenox  Disposition: return to nursing home  pending clearance and 24hrs w/o sitter/restraints  Subjective:  Patient says he wanted some water so he tried to get up to get a drink.   He promised to not get up again and then immediately tried to get up, he is not oriented.  He does deny any pain and is very pleasant.  Objective: Temp:  [97.6 F (36.4 C)-98.6 F (37 C)] 97.6 F (36.4 C) (08/09 2030) Pulse Rate:  [78-102] 86 (08/09 2030) Resp:  [10-36] 25 (08/09 2030) BP: (100-134)/(69-91) 130/91 (08/09 2030) SpO2:  [89 %-100 %] 93 % (08/09 2030) Weight:  [132 lb 8 oz (60.1 kg)-134 lb 0.6 oz (60.8 kg)] 132 lb 8 oz (60.1 kg) (08/09 1609) Physical Exam: General: Frail, not oriented but able to respond verbally,  Eyes: EOMI ENTM: dry mucous membranes Cardiovascular: RRR, no MRG, no JVD Respiratory: left lung CTA, right lung decreased sounds w/ rhonchi Gastrointestinal: +bowel sounds, nontender/soft belly MSK: thin extremities, generalized weakness Neuro: alert, can answer yes or no questions Psych: mood seems appropriate, unable to assess fully, not fully registering conversations  Laboratory:  Recent Labs Lab 01/24/17 1637 01/25/17 0616 01/25/17 1509 01/27/17 0301  WBC 7.8 7.7  --  4.3  HGB 7.2* 6.9* 6.9* 7.3*  HCT 23.6* 22.4* 22.8* 23.8*  PLT 196 192  --  219    Recent Labs Lab 02/01/2017 1937  01/25/17 0523 01/26/17 0206 01/27/17 0301  NA 136  < > 140 143 147*  K 4.5  < > 4.7 3.4* 4.4  CL 101  < > 106 107 108  CO2 23  < > _0 BUN 5*  < > _1 CREATININE 0.82  < > 1.16 1.14 1.17  CALCIUM 7.5*  < > 7.3* 7.4* 7.6*  PROT 6.1*  --   --   --   --   BILITOT 0.9  --   --   --   --   ALKPHOS 102  --   --   --   --   ALT 14*  --   --   --   --   AST 47*  --   --   --   --   GLUCOSE 186*  < > 127* 90 148*  < > = values in this interval not displayed.    Imaging/Diagnostic Tests: Ct Chest W Contrast  Result Date: 01/22/2017 CLINICAL DATA:  Status post chest tube for right empyema. EXAM: CT CHEST WITH  CONTRAST TECHNIQUE: Multidetector CT imaging of the chest was performed during intravenous contrast administration. CONTRAST:  62m ISOVUE-300 IOPAMIDOL (ISOVUE-300) INJECTION 61% COMPARISON:  Chest CT dated 01/16/2017. Chest x-ray from earlier same day. FINDINGS: Cardiovascular: Heart size is normal. No pericardial effusion. Coronary artery calcifications. No thoracic aortic aneurysm or dissection. Mediastinum/Nodes: Scattered small and moderate-sized lymph nodes within the mediastinum and perihilar regions, likely reactive in nature. Trachea and central bronchi are unremarkable. Lungs/Pleura: Large complex collections are seen throughout the right chest, major components probably contiguous but also likely multiloculated to some degree. Dominant collection of fluid and air along the posterior aspects of the right upper lobe and right lower lobe measures over 23 cm craniocaudal dimension (series 7, image 66) and approximately 12 x 7 cm transverse by AP dimensions near the right lung base (series 7, image  61; series 3, image 126). The posterior collection appears contiguous with an additional large complex collection of fluid and air at the right lung apex (probable connection seen on series 3, image 52). The complex collection in the right lung apex measures 14 cm craniocaudal dimension and 5 cm thickness (series 7, image 54; series 3, image 63). Lastly, additional complex fluid collection is seen along the right lateral chest wall measuring 9 cm AP dimension and 2.5 cm thickness (series 3, image 80). This collection does appear contiguous with the collection at the right lung apex. Moderate-size layering pleural effusion on the left with adjacent compressive atelectasis. Upper Abdomen: Free fluid/ascites within the upper abdomen, at least a moderate amount, incompletely imaged. Musculoskeletal: No acute or suspicious osseous finding. Right-sided chest tube seen on CT of 01/16/2017 has been removed. IMPRESSION: 1.  Large complex collections of fluid and air throughout the right chest, locations and measurements given above, largest collection along the posterior right chest measuring over 20 cm greatest dimension, presumed empyemas. These findings represent a significant worsening compared to the earlier chest CT of 01/16/2017. These collections appear to be contiguous but are also likely multiloculated to some degree. Right-sided chest tube has been removed in the interval. 2. Moderate-sized left pleural effusion. 3. Mediastinal and perihilar lymphadenopathy is likely reactive in nature. 4. Upper abdominal ascites, incompletely imaged. These results were called by telephone at the time of interpretation on 01/22/2017 at 11:00 am to Dr. Lissa Morales , who verbally acknowledged these results. Electronically Signed   By: Franki Cabot M.D.   On: 01/22/2017 11:17   Dg Chest Port 1 View  Result Date: 01/22/2017 CLINICAL DATA:  Empyema.  Status post chest tube placement. EXAM: PORTABLE CHEST 1 VIEW COMPARISON:  CT chest 0 8/0 scratch the CT chest earlier today. Single-view of the chest 02/14/2017. FINDINGS: A new right chest tube is in place. Loculated pleural fluid collections in the right chest do not appear changed. Airspace disease in the right lung is also not notably changed. The left lung is expanded and clear. Left pleural effusion is noted. IMPRESSION: No marked change in loculated right pleural effusions and airspace disease with a new chest tube in place. Negative for pneumothorax. Small left pleural effusion seen on prior CT scan is not well demonstrated on this exam. Electronically Signed   By: Inge Rise M.D.   On: 01/22/2017 15:51   Dg Chest Portable 1 View  Result Date: 02/08/2017 CLINICAL DATA:  Shortness of breath.  History of empyema. EXAM: PORTABLE CHEST 1 VIEW COMPARISON:  PA and lateral chest 01/31/2017 and 01/18/2017. CT chest 01/16/2017. FINDINGS: Pleural effusion which appears loculated along  the right chest wall is again seen. Right basilar effusion is new since the most recent examination. There is new airspace disease throughout the right chest. The left lung is clear. Heart size is normal. IMPRESSION: New airspace disease throughout the right chest with a new right basilar effusion worrisome for pneumonia. Loculated right pleural effusion does not appear notably changed since the most recent exam. Electronically Signed   By: Inge Rise M.D.   On: 02/03/2017 20:03     Sherene Sires, DO 01/27/2017, 11:21 PM PGY-1, [place] Intern pager: [phone], text pages welcome

## 2017-01-27 NOTE — Progress Notes (Signed)
CS[facility] able to return there once medically stable for discharge. Shiela informed CSW that pt could, however pt cant be in restraints or have a sitter 24 hours prior to returning to the facility.    Claude MangesKierra S. Kennedie Pardoe, MSW, LCSW-A Emergency Department Clinical Social Worker [phone]

## 2017-01-27 NOTE — Progress Notes (Signed)
PULMONARY / CRITICAL CARE MEDICINE   Name: Jay Stephens MRN: [id number] DOB: [birth year]    ADMISSION DATE:  02/03/2017 CONSULTATION DATE:  01/22/2013  REFERRING MD:  Dr. Roxan Hockey / CVTS  CHIEF COMPLAINT:  Complicated Right Pleural Space  HISTORY OF PRESENT ILLNESS:  71 y.o. debilitated man w hx malnutrition, HTN and diastolic dysfxn, recent aspiration PNA c/b S viridans R empyema. This was drained by chest tube with improvement but some residual fluid pockets of fluid by CT chest 01/16/17. He was discharged to SNF on 8/3 but returned later the same day with encephalopathy, dyspnea, hypoxemia. Treated with broad spectrum Abx and transiently required BiPAP. Repeat CT scan 8/4 shows multiple large fluid collections largest posterior R chest, other possibly contiguous collections. TCTS and PCCM consulted to help w management.   SUBJECTIVE:  Patient self-extubated yesterday. Patient somewhat altered and not answering all questions. Denies any pain or difficulty breathing. No nausea. Patient is hungry.   REVIEW OF SYSTEMS:  Unable to obtain with altered mentation.  VITAL SIGNS: BP 134/80   Pulse (!) 101   Temp 98.3 F (36.8 C) (Axillary)   Resp (!) 28   Ht _0  (1.753 m)   Wt 134 lb 0.6 oz (60.8 kg)   SpO2 95%   BMI 19.79 kg/m   HEMODYNAMICS:    VENTILATOR SETTINGS: Vent Mode: PRVC FiO2 (%):  [40 %] 40 % Set Rate:  [14 bmp] 14 bmp Vt Set:  [560 mL] 560 mL PEEP:  [5 cmH20] 5 cmH20 Plateau Pressure:  [17 cmH20] 17 cmH20  INTAKE / OUTPUT: I/O last 3 completed shifts: In: 1182.5 [NG/GT:982.5; IV Piggyback:200] Out: 1510 [Urine:1480; Chest Tube:30]  PHYSICAL EXAMINATION: General: Awake. No family. No distress. Integument:  Dry. Warm. No rash on exposed skin. Extremities:  No cyanosis or clubbing.  HEENT: Tacky mucus membranes. No scleral icterus.  Cardiovascular:  Regular rate. No JVD. Pulmonary:  Diminished breath sounds on the right. Slightly increased work of breathing.   Abdomen: Soft. Nondistended. Normal bowel sounds. Neurological: Following commands. Hand grip equal bilaterally. Moving extremities on command.   LABS:  BMET  Recent Labs Lab 01/25/17 0523 01/26/17 0206 01/27/17 0301  NA 140 143 147*  K 4.7 3.4* 4.4  CL 106 107 108  CO2 _1 BUN _2 CREATININE 1.16 1.14 1.17  GLUCOSE 127* 90 148*    Electrolytes  Recent Labs Lab 01/25/17 0523 01/26/17 0206 01/27/17 0301  CALCIUM 7.3* 7.4* 7.6*  MG 1.8 1.8 2.0  PHOS 4.0 2.7 3.6    CBC  Recent Labs Lab 01/24/17 1637 01/25/17 0616 01/25/17 1509 01/27/17 0301  WBC 7.8 7.7  --  4.3  HGB 7.2* 6.9* 6.9* 7.3*  HCT 23.6* 22.4* 22.8* 23.8*  PLT 196 192  --  219    Coag's  Recent Labs Lab 01/22/17 0350 01/24/17 0544  APTT 34  --   INR 0.98 1.06    Sepsis Markers  Recent Labs Lab 02/11/2017 1939 01/22/17 0002 01/22/17 0005  01/24/17 0858 01/25/17 0523 01/26/17 0206  LATICACIDVEN 5.59* 1.5 1.51  --   --   --   --   PROCALCITON  --   --   --   < > 2.66 1.92 1.23  < > = values in this interval not displayed.  ABG  Recent Labs Lab 02/05/2017 1939  PHART 7.375  PCO2ART 39.1  PO2ART 57.0*    Liver Enzymes  Recent Labs Lab 02/12/2017 1937 01/25/17 0523 01/26/17 0206  01/27/17 0301  AST 47*  --   --   --   ALT 14*  --   --   --   ALKPHOS 102  --   --   --   BILITOT 0.9  --   --   --   ALBUMIN 1.3* 1.3* 1.3* 1.4*    Cardiac Enzymes No results for input(s): TROPONINI, PROBNP in the last 168 hours.  Glucose  Recent Labs Lab 01/26/17 0708 01/26/17 1108 01/26/17 1520 01/26/17 1921 01/27/17 0323 01/27/17 0713  GLUCAP 142* 129* 127* 124* 132* 141*    Imaging Dg Chest 1v Repeat Same Day  Result Date: 01/26/2017 CLINICAL DATA:  Status post right chest tube removal EXAM: CHEST - 1 VIEW SAME DAY COMPARISON:  Chest radiograph from earlier today. FINDINGS: Interval removal of right chest tube. Interval extubation. Interval removal of enteric tube.  Stable cardiomediastinal silhouette with normal heart size. No pneumothorax. Moderate peripheral right pleural effusion/thickening may be slightly increased. No left pleural effusion. Decreased aeration of the right hemithorax with increased hazy right lung opacity, favor hypoventilatory change. No acute consolidative airspace disease in the left lung. IMPRESSION: 1. No pneumothorax. 2. Decreased aeration of the right lung. 3. Moderate peripheral right pleural effusion/thickening, slightly increased. Electronically Signed   By: Ilona Sorrel M.D.   On: 01/26/2017 17:33     STUDIES:  CT CHEST W/ CONTRAST 7/29: IMPRESSION: 1. Significantly improved right-sided empyema. Small pockets of residual fluid are seen along the lateral chest wall and fissures. Pneumothorax is small and stable from chest x-ray earlier today. 2. Pneumonia and atelectasis on the right, greatest in the lower lobe. 3. Diminished concern for obstructing airway lesion when compared to admission CT, when there was bronchial distortion due to the pleural effusion. Recommend three-month follow-up CT in this smoker with nonspecific adenopathy. 4. Small upper abdominal ascites that is newly seen. 5. Aortic Atherosclerosis (ICD10-I70.0) and Emphysema (ICD10-J43.9). 6. 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 PORT CXR 7/30:  Previously reviewed by me. Minimal residual right pleural effusion. Minimal residual right pneumothorax. Right-sided chest tube directed caudally. PORT CXR 7/31:  Previously reviewed by me. Small right apical pneumothorax. No appreciable change compared with chest x-ray imaging from last evening. No new focal opacity appreciated. No clear evolution of or development of right pleural effusion. CT CHEST W/ CONTRAST 8/4: IMPRESSION: 1. Large complex collections of fluid and air throughout the right chest, locations and measurements given above, largest collection along the posterior right  chest measuring over 20 cm greatest dimension, presumed empyemas. These findings represent a significant worsening compared to the earlier chest CT of 01/16/2017. These collections appear to be contiguous but are also likely multiloculated to some degree. Right-sided chest tube has been removed in the interval. 2. Moderate-sized left pleural effusion. 3. Mediastinal and perihilar lymphadenopathy is likely reactive in nature. 4. Upper abdominal ascites, incompletely imaged. PORT CXR 8/5:  Personally reviewed by me. Right-sided chest tube in good position. Endotracheal tube in good position. Enteric feeding tube coursing below diaphragm. Right lung opacities persistent. CT CHEST W/O 8/7: IMPRESSION: 1. Multifactorial degradation, as detailed above. 2. Similar appearance and configuration of multiple loculated right-sided pleural fluid collections, most consistent with empyemas. Small foci of pleural gas are not significantly changed.  3. Placement of a right-sided large-bore chest tube since the prior CT. The chest tube is surrounded by pulmonary parenchyma, and is likely not sufficiently draining the pleural space. 4.  Emphysema (ICD10-J43.9). 5. Coronary artery  atherosclerosis. Aortic Atherosclerosis (ICD10-I70.0). 6. Similar small left pleural effusion. 7. Upper abdominal ascites. PORT CXR 8/8:  Pereviously reviewed by me. Enteric feeding tube coursing below diaphragm. Endotracheal tube in good position. Right chest tube in good position. Slight improvement in right lung patchy opacification with residual pleural effusion still present. PORT CXR 8/8:  Personally reviewed by me. Endotracheal and enteric feeding tubes removed. Right chest tube removed. Lordotic view with slight rotation to the right. Increased opacity right lung with residual effusion.   MICROBIOLOGY: MRSA PCR 7/24:  Negative Blood Cultures x2 7/24:  Negative  Right BAL 7/24:  Multiple Organisms Right Pleural Fluid culture 7/24:   Viridans streptococci  MRSA PCR 8/1:  Negative Blood Cultures x2 8/3 >>> Urine Culture 8/3:  Negative  MRSA PCR 8/4:  Negative  Tracheal Aspirate Culture 8/6:  Negative  Urine Culture 8/6:  Negative  Blood Cultures x2 8/6 >>>  ANTIBIOTICS: Vancomycin 7/24 - 7/25 Zosyn 7/24 - 7/28 Ancef 7/28 - 8/3 Vancomycin 8/3  - 8/8  Zosyn 8/3 >>>  SIGNIFICANT EVENTS: 7/23 - admit to FMTS 7/24 - rapid response, intubated, bronchoscopy, R chest tube placed with ~3L milky yellow pus drained 7/29 - chest tube to water seal w/ small residual pneumothorax 7/30 - chest tube removed w/ small residual right pneumothorax 8/03 - discharged & returned promptly  8/05 - self-extubated & was reintubated w/ 7.5 ETT from 8.0 ETT 8/06 - febrile >> recultured  8/08 - self-extubated despite restraints. Chest tube removed.   LINES/TUBES: OETT 7.5 8/5 - 8/8 (self extubated twice now) OGT 8/5 - 8/8 R CHEST TUBE 8/5 - 8/8 PIV  ASSESSMENT / PLAN:  PULMONARY A: Acute Hypoxic Respiratory Failure:  Self-extubated x2 - last today.  Hemoptysis - Occurred with intrapleural lytic therapy.  Right Empyema - S/P removal of chest tube 8/8. Apnea - No respiratory effort. Possibly secondary to Fentanyl drip.   P:   Repeat CXR pending for this morning Continuous pulse oximetry monitoring Continuing pulmonary toilette with incentive spirometry CT surgery following  CARDIOVASCULAR A:  No acute issues.  P:  Monitoring patient on telemetry Monitoring vitals per Unit Protocol  RENAL A:   Hypokalemia:  Resolved.  P:   Monitoring UOP Repeat electrolyte panel in the morning.   GASTROINTESTINAL A:   Moderate Protein-Calorie Malnutrition  P:   NPO pending speech evaluation.   HEMATOLOGIC A:   Anemia - Multifactorial. Stable.  P:  Repeat CBC in AM Continuing Heparin [place] q8hr & SCDs Plan to transfuse for Hgb <7.0 or active bleeding  INFECTIOUS A:   Streptococcus Pneumonia w/ Empyema  Sepsis  P:    Currently on Day #7 of Zosyn Trending Procalcitonin per Algorithm  Awaiting finalization of cultures   ENDOCRINE A:   Hyperglycemia - No h/o DM.    P:   Accu-Checks q4hr  Insulin per standard sliding scale algorithm   NEUROLOGIC A:   Acute Encephalopathy:  Likely multifactorial.  P:   RASS goal: 0  Using tylenol for pain Limiting sedatives PT/OT Consulted  FAMILY  - Updates: Still no family at bedside during 8/9 rounds.   - Inter-disciplinary family meet or Palliative Care meeting due by:  81/10  DISCUSSION:  71 y.o. male with Streptococcus pneumonia and empyema. Patient's respiratory status continues to remain stable post self extubation. I am concerned about his further improvement given his previous clinical course. However, he seems to be improving with his current antibiotic regimen. I suspect his current altered mentation/acute encephalopathy is  likely multifactorial. This warrants close monitoring and a stepdown unit.  I have spent a total of 36 minutes of time today caring for the patient, reviewing the patient's electronic medical record, and with more than 50% of that time spent coordinating transfer all care with the patient as well as reviewing the continuing plan of care with the patient's nurse at bedside.  Transferring patient to SDU bed. FMTS to resume care & PCCM will follow as a pulmonary consultant starting 8/10.  Sonia Baller Ashok Cordia, M.D. [facility] Pager:  [phone] After 3pm or if no response, call [phone] 01/27/2017, 8:00 AM

## 2017-01-28 ENCOUNTER — Inpatient Hospital Stay (HOSPITAL_COMMUNITY): Payer: Medicare Other

## 2017-01-28 DIAGNOSIS — R4182 Altered mental status, unspecified: Secondary | ICD-10-CM

## 2017-01-28 DIAGNOSIS — Z515 Encounter for palliative care: Secondary | ICD-10-CM

## 2017-01-28 DIAGNOSIS — R748 Abnormal levels of other serum enzymes: Secondary | ICD-10-CM

## 2017-01-28 LAB — RENAL FUNCTION PANEL
ANION GAP: 9 (ref 5–15)
Albumin: 1.4 g/dL — ABNORMAL LOW (ref 3.5–5.0)
BUN: 25 mg/dL — ABNORMAL HIGH (ref 6–20)
CALCIUM: 7.7 mg/dL — AB (ref 8.9–10.3)
CO2: 29 mmol/L (ref 22–32)
CREATININE: 1.28 mg/dL — AB (ref 0.61–1.24)
Chloride: 109 mmol/L (ref 101–111)
GFR, EST NON AFRICAN AMERICAN: 55 mL/min — AB (ref >60)
Glucose, Bld: 141 mg/dL — ABNORMAL HIGH (ref 65–99)
Phosphorus: 3.8 mg/dL (ref 2.5–4.6)
Potassium: 4 mmol/L (ref 3.5–5.1)
SODIUM: 147 mmol/L — AB (ref 135–145)

## 2017-01-28 LAB — BLOOD GAS, ARTERIAL
Acid-base deficit: 1.4 mmol/L (ref 0.0–2.0)
BICARBONATE: 22.4 mmol/L (ref 20.0–28.0)
DRAWN BY: 252031
O2 CONTENT: 5 L/min
O2 Saturation: 87.5 %
PATIENT TEMPERATURE: 98.6
PO2 ART: 59.3 mmHg — AB (ref 83.0–108.0)
pCO2 arterial: 34.9 mmHg (ref 32.0–48.0)
pH, Arterial: 7.424 (ref 7.350–7.450)

## 2017-01-28 LAB — TROPONIN I
Troponin I: 2.23 ng/mL (ref <0.03)
Troponin I: 1.91 ng/mL (ref <0.03)

## 2017-01-28 LAB — CBC WITH DIFFERENTIAL/PLATELET
BASOS PCT: 0 %
Basophils Absolute: 0 10*3/uL (ref 0.0–0.1)
Eosinophils Absolute: 0 10*3/uL (ref 0.0–0.7)
Eosinophils Relative: 0 %
HEMATOCRIT: 26.1 % — AB (ref 39.0–52.0)
Hemoglobin: 7.8 g/dL — ABNORMAL LOW (ref 13.0–17.0)
LYMPHS ABS: 1.8 10*3/uL (ref 0.7–4.0)
Lymphocytes Relative: 38 %
MCH: 25.8 pg — AB (ref 26.0–34.0)
MCHC: 29.9 g/dL — AB (ref 30.0–36.0)
MCV: 86.4 fL (ref 78.0–100.0)
MONO ABS: 0.5 10*3/uL (ref 0.1–1.0)
MONOS PCT: 11 %
NEUTROS ABS: 2.4 10*3/uL (ref 1.7–7.7)
Neutrophils Relative %: 51 %
Platelets: 255 10*3/uL (ref 150–400)
RBC: 3.02 MIL/uL — ABNORMAL LOW (ref 4.22–5.81)
RDW: 16.4 % — AB (ref 11.5–15.5)
WBC: 4.7 10*3/uL (ref 4.0–10.5)

## 2017-01-28 LAB — MAGNESIUM: Magnesium: 2 mg/dL (ref 1.7–2.4)

## 2017-01-28 LAB — GLUCOSE, CAPILLARY
GLUCOSE-CAPILLARY: 115 mg/dL — AB (ref 65–99)
GLUCOSE-CAPILLARY: 118 mg/dL — AB (ref 65–99)
GLUCOSE-CAPILLARY: 130 mg/dL — AB (ref 65–99)
Glucose-Capillary: 142 mg/dL — ABNORMAL HIGH (ref 65–99)
Glucose-Capillary: 115 mg/dL — ABNORMAL HIGH (ref 65–99)
Glucose-Capillary: 121 mg/dL — ABNORMAL HIGH (ref 65–99)

## 2017-01-28 LAB — BRAIN NATRIURETIC PEPTIDE: B NATRIURETIC PEPTIDE 5: 1560.6 pg/mL — AB (ref 0.0–100.0)

## 2017-01-28 MED ORDER — ALBUTEROL SULFATE (2.5 MG/3ML) 0.083% IN NEBU: 2.5000 mg | INHALATION_SOLUTION | RESPIRATORY_TRACT | Status: DC | PRN

## 2017-01-28 MED ORDER — IPRATROPIUM-ALBUTEROL 0.5-2.5 (3) MG/3ML IN SOLN
3.0000 mL | Freq: Four times a day (QID) | RESPIRATORY_TRACT | Status: DC
  Administered 2017-01-28 – 2017-01-29 (×2): 3 mL via RESPIRATORY_TRACT
  Filled 2017-01-28 (×2): qty 3

## 2017-01-28 MED ORDER — LORAZEPAM 2 MG/ML IJ SOLN: 0.5000 mg | Freq: Once | INTRAMUSCULAR | Status: DC

## 2017-01-28 MED ORDER — LORAZEPAM 0.5 MG PO TABS: 0.5000 mg | ORAL_TABLET | Freq: Once | ORAL | Status: DC

## 2017-01-28 MED ORDER — SODIUM CHLORIDE 0.9 % IV SOLN
INTRAVENOUS | Status: DC
  Administered 2017-01-28: 15:00:00 via INTRAVENOUS

## 2017-01-28 MED ORDER — ASPIRIN 300 MG RE SUPP
300.0000 mg | Freq: Once | RECTAL | Status: AC
  Administered 2017-01-29: 300 mg via RECTAL
  Filled 2017-01-28: qty 1

## 2017-01-28 MED ORDER — FUROSEMIDE 10 MG/ML IJ SOLN
40.0000 mg | Freq: Once | INTRAMUSCULAR | Status: AC
  Administered 2017-01-28: 40 mg via INTRAVENOUS
  Filled 2017-01-28: qty 4

## 2017-01-28 NOTE — Consult Note (Signed)
Reason for Consult: Elevated troponin   Requesting Physician/Service: Family Medicine   PCP:  System, Pcp Not In Primary Cardiologist:N/A  HPI:   [phone] with Emphysema, Diastolic CHF, HTN, and recent aspiration pneumonia with strep viridans empyema, treated with chest tube then discharged to SNF, readmitted 8/3 with AMS, SOB, Hypoxia requiring intubation. Chest tube removed on 8/8. Patient self-extubated on 8/9 with some hypoxic episodes as well at this time with pulmonary edema on xray with plans to be treated with lasix.  Clinical course complicated by delirium.  Palliative care is involved.   This evening was found by his  floor nurse altered and trying to crawl out his bed with O2 desat and audibly rasping respirations w/ tachypnia.  (PO2 at that time was 60 from arterial stick).   Workup included troponin which was elevated at 2.  ECG with inferolateral ST depressions.  No report of chest pain.   Other co-morbidities include anemia (7.8), protein calorie malnutrition, sacral ulcer.   On interview- he appears to mostly be oriented to person, but not place.  He denies feeling bad earlier- no report of chest pain.  Previous Cardiac Studies: Echo on 01/12/17 Study Conclusions  - Left ventricle: The cavity size was normal. Systolic function was   normal. The estimated ejection fraction was in the range of 55%   to 60%. The apical 3 chambe view is poor quality due to   ventilated patient. Images are suggestive of akinesis of the   inferolateral myocardium. Features are consistent with a   pseudonormal left ventricular filling pattern, with concomitant   abnormal relaxation and increased filling pressure (grade 2   diastolic dysfunction). - Aortic valve: Trileaflet; moderately thickened, moderately   calcified leaflets. - Tricuspid valve: There was trivial regurgitation. - Pulmonic valve: Poorly visualized.  History reviewed. No pertinent past medical history. othen than noted  above  Past Surgical History:  Procedure Laterality Date  . DENTAL RESTORATION/EXTRACTION WITH X-RAY N/A 02/14/2017   Procedure: SURGICAL EXCTRACTIONS OF ABSCESSED TOOTH/TEETH;  Surgeon: Michael Litter, DMD;  Location: [facility];  Service: Oral Surgery;  Laterality: N/A;  . INCISION AND DRAINAGE ABSCESS N/A 02/10/2017   Procedure: INCISION AND DRAINAGE ABSCESS;  Surgeon: Michael Litter, DMD;  Location: [facility];  Service: Oral Surgery;  Laterality: N/A;    History reviewed. No pertinent family history. Not able to provide other history Social History:  reports that he has been smoking.  He has a 7.50 pack-year smoking history. He uses smokeless tobacco.   Allergies: No Known Allergies  No current facility-administered medications on file prior to encounter.    Current Outpatient Prescriptions on File Prior to Encounter  Medication Sig Dispense Refill  . amoxicillin-clavulanate (AUGMENTIN) 875-125 MG tablet Take 1 tablet by mouth 2 (two) times daily. 13 tablet 0    Results for orders placed or performed during the hospital encounter of 01/27/2017 (from the past 48 hour(s))  Magnesium     Status: None   Collection Time: 01/27/17  3:01 AM  Result Value Ref Range   Magnesium 2.0 1.7 - 2.4 mg/dL  CBC with Differential/Platelet     Status: Abnormal   Collection Time: 01/27/17  3:01 AM  Result Value Ref Range   WBC 4.3 4.0 - 10.5 K/uL   RBC 2.75 (L) 4.22 - 5.81 MIL/uL   Hemoglobin 7.3 (L) 13.0 - 17.0 g/dL   HCT 23.8 (L) 39.0 - 52.0 %   MCV 86.5 78.0 - 100.0 fL   MCH 26.5  26.0 - 34.0 pg   MCHC 30.7 30.0 - 36.0 g/dL   RDW 16.4 (H) 11.5 - 15.5 %   Platelets 219 150 - 400 K/uL   Neutrophils Relative % 62 %   Neutro Abs 2.7 1.7 - 7.7 K/uL   Lymphocytes Relative 27 %   Lymphs Abs 1.2 0.7 - 4.0 K/uL   Monocytes Relative 10 %   Monocytes Absolute 0.4 0.1 - 1.0 K/uL   Eosinophils Relative 0 %   Eosinophils Absolute 0.0 0.0 - 0.7 K/uL   Basophils Relative 1 %   Basophils Absolute 0.0 0.0 - 0.1 K/uL    Renal function panel     Status: Abnormal   Collection Time: 01/27/17  3:01 AM  Result Value Ref Range   Sodium 147 (H) 135 - 145 mmol/L   Potassium 4.4 3.5 - 5.1 mmol/L    Comment: DELTA CHECK NOTED   Chloride 108 101 - 111 mmol/L   CO2 26 22 - 32 mmol/L   Glucose, Bld 148 (H) 65 - 99 mg/dL   BUN 20 6 - 20 mg/dL   Creatinine, Ser 1.17 0.61 - 1.24 mg/dL   Calcium 7.6 (L) 8.9 - 10.3 mg/dL   Phosphorus 3.6 2.5 - 4.6 mg/dL   Albumin 1.4 (L) 3.5 - 5.0 g/dL   GFR calc non Af Amer >60 >60 mL/min   GFR calc Af Amer >60 >60 mL/min    Comment: (NOTE) The eGFR has been calculated using the CKD EPI equation. This calculation has not been validated in all clinical situations. eGFR's persistently <60 mL/min signify possible Chronic Kidney Disease.    Anion gap 13 5 - 15  Glucose, capillary     Status: Abnormal   Collection Time: 01/27/17  3:23 AM  Result Value Ref Range   Glucose-Capillary 132 (H) 65 - 99 mg/dL   Comment 1 Capillary Specimen    Comment 2 Notify RN   Glucose, capillary     Status: Abnormal   Collection Time: 01/27/17  7:13 AM  Result Value Ref Range   Glucose-Capillary 141 (H) 65 - 99 mg/dL   Comment 1 Notify RN   Glucose, capillary     Status: Abnormal   Collection Time: 01/27/17 11:38 AM  Result Value Ref Range   Glucose-Capillary 122 (H) 65 - 99 mg/dL   Comment 1 Capillary Specimen   Glucose, capillary     Status: Abnormal   Collection Time: 01/27/17  3:20 PM  Result Value Ref Range   Glucose-Capillary 128 (H) 65 - 99 mg/dL   Comment 1 Capillary Specimen   Glucose, capillary     Status: Abnormal   Collection Time: 01/27/17  4:23 PM  Result Value Ref Range   Glucose-Capillary 124 (H) 65 - 99 mg/dL  Glucose, capillary     Status: Abnormal   Collection Time: 01/27/17  8:47 PM  Result Value Ref Range   Glucose-Capillary 140 (H) 65 - 99 mg/dL  Glucose, capillary     Status: Abnormal   Collection Time: 01/28/17 12:33 AM  Result Value Ref Range    Glucose-Capillary 130 (H) 65 - 99 mg/dL  CBC with Differential/Platelet     Status: Abnormal   Collection Time: 01/28/17  3:07 AM  Result Value Ref Range   WBC 4.7 4.0 - 10.5 K/uL   RBC 3.02 (L) 4.22 - 5.81 MIL/uL   Hemoglobin 7.8 (L) 13.0 - 17.0 g/dL   HCT 26.1 (L) 39.0 - 52.0 %   MCV 86.4 78.0 -  100.0 fL   MCH 25.8 (L) 26.0 - 34.0 pg   MCHC 29.9 (L) 30.0 - 36.0 g/dL   RDW 16.4 (H) 11.5 - 15.5 %   Platelets 255 150 - 400 K/uL   Neutrophils Relative % 51 %   Neutro Abs 2.4 1.7 - 7.7 K/uL   Lymphocytes Relative 38 %   Lymphs Abs 1.8 0.7 - 4.0 K/uL   Monocytes Relative 11 %   Monocytes Absolute 0.5 0.1 - 1.0 K/uL   Eosinophils Relative 0 %   Eosinophils Absolute 0.0 0.0 - 0.7 K/uL   Basophils Relative 0 %   Basophils Absolute 0.0 0.0 - 0.1 K/uL  Magnesium     Status: None   Collection Time: 01/28/17  3:07 AM  Result Value Ref Range   Magnesium 2.0 1.7 - 2.4 mg/dL  Renal function panel     Status: Abnormal   Collection Time: 01/28/17  3:07 AM  Result Value Ref Range   Sodium 147 (H) 135 - 145 mmol/L   Potassium 4.0 3.5 - 5.1 mmol/L   Chloride 109 101 - 111 mmol/L   CO2 29 22 - 32 mmol/L   Glucose, Bld 141 (H) 65 - 99 mg/dL   BUN 25 (H) 6 - 20 mg/dL   Creatinine, Ser 1.28 (H) 0.61 - 1.24 mg/dL   Calcium 7.7 (L) 8.9 - 10.3 mg/dL   Phosphorus 3.8 2.5 - 4.6 mg/dL   Albumin 1.4 (L) 3.5 - 5.0 g/dL   GFR calc non Af Amer 55 (L) >60 mL/min   GFR calc Af Amer >60 >60 mL/min    Comment: (NOTE) The eGFR has been calculated using the CKD EPI equation. This calculation has not been validated in all clinical situations. eGFR's persistently <60 mL/min signify possible Chronic Kidney Disease.    Anion gap 9 5 - 15  Glucose, capillary     Status: Abnormal   Collection Time: 01/28/17  4:15 AM  Result Value Ref Range   Glucose-Capillary 142 (H) 65 - 99 mg/dL  Glucose, capillary     Status: Abnormal   Collection Time: 01/28/17  7:41 AM  Result Value Ref Range   Glucose-Capillary 115  (H) 65 - 99 mg/dL   Comment 1 Notify RN    Comment 2 Document in Chart   Glucose, capillary     Status: Abnormal   Collection Time: 01/28/17  1:09 PM  Result Value Ref Range   Glucose-Capillary 115 (H) 65 - 99 mg/dL  Glucose, capillary     Status: Abnormal   Collection Time: 01/28/17  4:42 PM  Result Value Ref Range   Glucose-Capillary 118 (H) 65 - 99 mg/dL  Glucose, capillary     Status: Abnormal   Collection Time: 01/28/17  8:55 PM  Result Value Ref Range   Glucose-Capillary 121 (H) 65 - 99 mg/dL  Troponin I     Status: Abnormal   Collection Time: 01/28/17  9:09 PM  Result Value Ref Range   Troponin I 2.23 (HH) <0.03 ng/mL    Comment: CRITICAL RESULT CALLED TO, READ BACK BY AND VERIFIED WITHChestine Spore RN [phone] [id number] GREEN R   Blood gas, arterial     Status: Abnormal   Collection Time: 01/28/17  9:10 PM  Result Value Ref Range   O2 Content 5.0 L/min   Delivery systems NASAL CANNULA    pH, Arterial 7.424 7.350 - 7.450   pCO2 arterial 34.9 32.0 - 48.0 mmHg   pO2, Arterial 59.3 (L) 83.0 -  108.0 mmHg   Bicarbonate 22.4 20.0 - 28.0 mmol/L   Acid-base deficit 1.4 0.0 - 2.0 mmol/L   O2 Saturation 87.5 %   Patient temperature 98.6    Collection site RIGHT RADIAL    Drawn by [id number]    Sample type ARTERIAL DRAW    Allens test (pass/fail) PASS PASS   Dg Cervical Spine 2 Or 3 Views  Result Date: 01/28/2017 CLINICAL DATA:  Unwitnessed fall. EXAM: CERVICAL SPINE - 2-3 VIEW COMPARISON:  None. FINDINGS: Limited visualization of C7 due to overlapping shoulders. This area was covered on 01/25/2017 chest CT and negative for fracture or listhesis. There is no evidence of fracture or traumatic malalignment. No prevertebral thickening. Generalized disc narrowing and mild facet spurring. No asymmetric pleural and parenchymal disease on the right. Cervical carotid atherosclerotic calcification. IMPRESSION: Limited study without evidence of acute cervical spine injury. Electronically Signed   By:  Monte Fantasia M.D.   On: 01/28/2017 12:53   Ct Head Wo Contrast  Result Date: 01/28/2017 CLINICAL DATA:  Initial evaluation for unwitnessed fall. EXAM: CT HEAD WITHOUT CONTRAST TECHNIQUE: Contiguous axial images were obtained from the base of the skull through the vertex without intravenous contrast. COMPARISON:  Prior CT from 01/11/2017. FINDINGS: Brain: Moderate cerebral and cerebellar atrophy. Encephalomalacia at the right temporal lobe and anterior inferior frontal lobes again noted, unchanged. Remote lacunar infarct noted within the right basal ganglia. Underlying chronic microvascular ischemic disease. No acute intracranial hemorrhage. No evidence for acute large vessel territory infarct. No mass lesion, midline shift or mass effect. Stable ventricular prominence related global parenchymal volume loss of hydrocephalus. No extra-axial fluid collection. Vascular: No hyperdense vessel. Scattered vascular calcifications noted within the carotid siphons. Skull: Scalp soft tissues demonstrate no acute abnormality. Sequelae of prior right temporal craniectomy. No acute calvarial fracture. Remote posttraumatic deformity noted about the left orbit. Sinuses/Orbits: Globes and oval soft tissues within normal limits. Chronic maxillary sinusitis noted. No mastoid effusion. IMPRESSION: 1. No acute intracranial process. 2. Stable atrophy with right temporal and frontal lobe encephalomalacia. 3. Chronic maxillary sinusitis. Electronically Signed   By: Jeannine Boga M.D.   On: 01/28/2017 13:10   Dg Chest Port 1 View  Result Date: 01/27/2017 CLINICAL DATA:  Pleural effusion EXAM: PORTABLE CHEST 1 VIEW COMPARISON:  January 26, 2017 FINDINGS: There is loculated pleural effusion on the right with areas of consolidation and unilateral pulmonary edema involving essentially the entire right lung. Left lung is clear. Heart is mildly enlarged, stable. The pulmonary vascularity is obscured on the right and appears normal  on the left. No adenopathy is demonstrable. No bone lesions. IMPRESSION: Combination of consolidation and loculated effusion on the right. There is also interstitial edema and comminuted lateral common the right. Appearance is stable compared to 1 day prior. Left lung is clear. Stable cardiac prominence. No evident pneumothorax. Electronically Signed   By: Lowella Grip III M.D.   On: 01/27/2017 08:19    ECG/TELE: NSR with inferolateral ST depressions  ROS: As above. Otherwise, review of systems is negative unless per above HPI  Vitals:   01/28/17 1300 01/28/17 1700 01/28/17 2105 01/28/17 2107  BP: 126/79 113/77 (!) 130/97   Pulse: 89 93 (!) 101   Resp: 18 18 (!) 38   Temp: 97.9 F (36.6 C) 97.8 F (36.6 C) (!) 97.5 F (36.4 C)   TempSrc: Axillary Axillary Axillary   SpO2: 92% 93%  91%  Weight:      Height:  Wt Readings from Last 10 Encounters:  01/27/17 60.1 kg (132 lb 8 oz)  02/17/2017 62.2 kg (137 lb 3.2 oz)    PE:  General: No acute distress, thin, confused HEENT: Atraumatic, EOMI, mucous membranes moist. No JVD at 90 degrees. No HJR. CV: tachy rate, regular with no murmurs, gallops.  Respiratory: Crackles left lung field, diminished sounds right side ABD: Non-distended and non-tender. No palpable organomegaly.  Extremities: 1+ radial pulses bilaterally. no edema. Neuro/Psych: CN grossly intact, alert and oriented  Assessment/Plan Elevated trop Anemia Hypoxia Failure to thrive/malnurishment Empyema with right sided lung field opacification  Assessment:  It sounds like he had a hypoxic episode earlier in the day, supported by ABG findings.  No report of chest pain, history limited by mental status.  I favor a supply demand mismatch troponin leak event, driven by low O2 delivery from hypoxia and anemia.  This can unmask fixed underlying coronary disease, but at this point, there does not appear to be a plaque rupture event.  Given his significant co-morbidities,  at this point systemic intravenous heparin seems to be greater risk than benefit.  I would recommend continuing to trend troponin values, which will likely continue to rise somewhat.  Also would recommend starting 325 ASA, followed by 81 mg ASA daily.  Pending his clinical course, troponin trend, we can re-evaluate/escalate this strategy at any time and cardiology will continue to follow along.  Recs: - ASA 325 now, 81 mg daily - daily ECG, repeat for new symptoms   Lolita Cram Kaleem Sartwell  MD 01/28/2017, 10:23 PM

## 2017-01-28 NOTE — Evaluation (Signed)
Physical Therapy Evaluation Patient Details Name: Jay Stephens MRN: [id number] DOB: 10/06/1945 Today's Date: 01/28/2017   History of Present Illness  71 year old readmitted from SNF with AMS and dyspnea on 8/3, the same day he was discharged. Required intubation with self extubation both 8/5 and 8/8 and chest tube discontinued 8/4. PMH: recent aspiration PNA with empyema requiring chest tube and prolonged hospitalization, protein calorie malnutrition, HTN, diastolic heart failure.  Clinical Impression  Pt admitted with above diagnosis and presents to PT with functional limitations due to deficits listed below (See PT problem list). Pt needs skilled PT to maximize independence and safety to allow discharge to SNF. Expect pt will make slow progress.     Follow Up Recommendations SNF    Equipment Recommendations  Other (comment) (To b)    Recommendations for Other Services       Precautions / Restrictions Precautions Precautions: Fall Restrictions Weight Bearing Restrictions: No      Mobility  Bed Mobility Overal bed mobility: Needs Assistance Bed Mobility: Supine to Sit;Sit to Sidelying     Supine to sit: Min assist   Sit to sidelying: Min assist General bed mobility comments: increased time, cues for technique, assist to raise trunk and assist LEs into bed  Transfers Overall transfer level: Needs assistance Equipment used: Rolling walker (2 wheeled) Transfers: Sit to/from Stand Sit to Stand: Min assist;+2 physical assistance         General transfer comment: assist to rise and steady, pt with buckling and hyperextension of L LE  Ambulation/Gait Ambulation/Gait assistance: +2 physical assistance;Min assist Ambulation Distance (Feet): 1 Feet (side step) Assistive device: Rolling walker (2 wheeled)   Gait velocity: decr Gait velocity interpretation: Below normal speed for age/gender General Gait Details: Pt took one small sidestep to the rt up toward the head of the  bed  Stairs            Wheelchair Mobility    Modified Rankin (Stroke Patients Only)       Balance Overall balance assessment: Needs assistance Sitting-balance support: No upper extremity supported Sitting balance-Leahy Scale: Fair     Standing balance support: Bilateral upper extremity supported Standing balance-Leahy Scale: Poor Standing balance comment: walker and min A for static standing. Pt stood x 3-4 minutes                             Pertinent Vitals/Pain Pain Assessment: Faces Faces Pain Scale: No hurt    Home Living Family/patient expects to be discharged to:: Skilled nursing facility Living Arrangements: Other relatives (nephew)               Additional Comments: family was displaced and living in hotel    Prior Function           Comments: unknown     Hand Dominance   Dominant Hand: Right    Extremity/Trunk Assessment   Upper Extremity Assessment Upper Extremity Assessment: Defer to OT evaluation    Lower Extremity Assessment Lower Extremity Assessment: Generalized weakness;LLE deficits/detail LLE Deficits / Details: LLE with buckling/hyperextending with standing and wt shift    Cervical / Trunk Assessment Cervical / Trunk Assessment: Kyphotic  Communication   Communication: HOH  Cognition Arousal/Alertness: Awake/alert Behavior During Therapy: Flat affect Overall Cognitive Status: Impaired/Different from baseline Area of Impairment: Safety/judgement;Following commands;Problem solving;Memory;Orientation                 Orientation Level: Disoriented to;Place;Time;Situation   Memory:  Decreased short-term memory Following Commands: Follows one step commands with increased time Safety/Judgement: Decreased awareness of safety;Decreased awareness of deficits   Problem Solving: Slow processing;Decreased initiation;Difficulty sequencing;Requires verbal cues;Requires tactile cues        General Comments       Exercises     Assessment/Plan    PT Assessment Patient needs continued PT services  PT Problem List Decreased strength;Decreased activity tolerance;Decreased balance;Decreased mobility;Decreased knowledge of use of DME;Decreased cognition;Decreased safety awareness       PT Treatment Interventions DME instruction;Gait training;Functional mobility training;Therapeutic activities;Therapeutic exercise;Balance training;Patient/family education    PT Goals (Current goals can be found in the Care Plan section)  Acute Rehab PT Goals Patient Stated Goal: to drink water PT Goal Formulation: Patient unable to participate in goal setting Time For Goal Achievement: 02/11/17 Potential to Achieve Goals: Good    Frequency Min 2X/week   Barriers to discharge        Co-evaluation PT/OT/SLP Co-Evaluation/Treatment: Yes Reason for Co-Treatment: For patient/therapist safety PT goals addressed during session: Mobility/safety with mobility OT goals addressed during session: ADL's and self-care       AM-PAC PT "6 Clicks" Daily Activity  Outcome Measure Difficulty turning over in bed (including adjusting bedclothes, sheets and blankets)?: Total Difficulty moving from lying on back to sitting on the side of the bed? : Total Difficulty sitting down on and standing up from a chair with arms (e.g., wheelchair, bedside commode, etc,.)?: Total Help needed moving to and from a bed to chair (including a wheelchair)?: Total Help needed walking in hospital room?: Total Help needed climbing 3-5 steps with a railing? : Total 6 Click Score: 6    End of Session Equipment Utilized During Treatment: Gait belt Activity Tolerance: Patient tolerated treatment well Patient left: in bed;with call bell/phone within reach;with bed alarm set   PT Visit Diagnosis: Unsteadiness on feet (R26.81);Other abnormalities of gait and mobility (R26.89);Muscle weakness (generalized) (M62.81);Adult, failure to thrive  (R62.7)    Time: 1610-9604 PT Time Calculation (min) (ACUTE ONLY): 20 min   Charges:   PT Evaluation $PT Eval Moderate Complexity: 1 Mod     PT G CodesMarland Kitchen        [facility] PT [phone]   Angelina Ok [facility] 01/28/2017, 4:30 PM

## 2017-01-28 NOTE — Progress Notes (Signed)
PT Cancellation Note  Patient Details Name: Jay HibbsJessie Stephens MRN: [id number] DOB: 08/02/1945   Cancelled Treatment:    Reason Eval/Treat Not Completed: Patient at procedure or test/unavailable. Will check back later.   Angelina OkCary W Maycok 01/28/2017, 12:39 PM [employer] Shelvy Heckert PT [phone]

## 2017-01-28 NOTE — Progress Notes (Signed)
PULMONARY / CRITICAL CARE MEDICINE   Name: Jay Stephens MRN: [id number] DOB: 1946-02-14    ADMISSION DATE:  02/06/2017 CONSULTATION DATE:  01/22/2013  REFERRING MD:  Dr. Roxan Hockey / CVTS  CHIEF COMPLAINT:  Complicated Right Pleural Space  HISTORY OF PRESENT ILLNESS:  71 y.o. debilitated man w hx malnutrition, HTN and diastolic dysfxn, recent aspiration PNA c/b S viridans R empyema. This was drained by chest tube with improvement but some residual fluid pockets of fluid by CT chest 01/16/17. He was discharged to SNF on 8/3 but returned later the same day with encephalopathy, dyspnea, hypoxemia. Treated with broad spectrum Abx and transiently required BiPAP. Repeat CT scan 8/4 shows multiple large fluid collections largest posterior R chest, other possibly contiguous collections. TCTS and PCCM consulted to help w management.   SUBJECTIVE:  RN reports no acute events.  Pt expresses that he needs to go to the bathroom > has pulled his condom catheter off.    VITAL SIGNS: BP 130/80 (BP Location: Right Arm)   Pulse 86   Temp 97.6 F (36.4 C) (Axillary)   Resp 18   Ht _0  (1.753 m)   Wt 132 lb 8 oz (60.1 kg)   SpO2 93%   BMI 19.57 kg/m   HEMODYNAMICS:    VENTILATOR SETTINGS:    INTAKE / OUTPUT: I/O last 3 completed shifts: In: 250 [IV Piggyback:250] Out: 335 [Urine:335]  PHYSICAL EXAMINATION: General: chronically ill appearing male in NAD, lying in bed / low position of bed with pads on floor   HEENT: MM pink/moist, no jvd PSY: calm Neuro: Awake, alert, oriented to self, asks to go to the bathroom, speech clear CV: s1s2 rrr, no m/r/g PULM: even/non-labored, diminished on R, occasional scattered rhonchi  WU:JWJX, non-tender, bsx4 active  Extremities: warm/dry, no edema  Skin: no rashes or lesions   LABS:  BMET  Recent Labs Lab 01/26/17 0206 01/27/17 0301 01/28/17 0307  NA 143 147* 147*  K 3.4* 4.4 4.0  CL 107 108 109  CO2 _1 BUN 18 20 25*  CREATININE  1.14 1.17 1.28*  GLUCOSE 90 148* 141*    Electrolytes  Recent Labs Lab 01/26/17 0206 01/27/17 0301 01/28/17 0307  CALCIUM 7.4* 7.6* 7.7*  MG 1.8 2.0 2.0  PHOS 2.7 3.6 3.8    CBC  Recent Labs Lab 01/25/17 0616 01/25/17 1509 01/27/17 0301 01/28/17 0307  WBC 7.7  --  4.3 4.7  HGB 6.9* 6.9* 7.3* 7.8*  HCT 22.4* 22.8* 23.8* 26.1*  PLT 192  --  219 255    Coag's  Recent Labs Lab 01/22/17 0350 01/24/17 0544  APTT 34  --   INR 0.98 1.06    Sepsis Markers  Recent Labs Lab 01/22/2017 1939 01/22/17 0002 01/22/17 0005  01/24/17 0858 01/25/17 0523 01/26/17 0206  LATICACIDVEN 5.59* 1.5 1.51  --   --   --   --   PROCALCITON  --   --   --   < > 2.66 1.92 1.23  < > = values in this interval not displayed.  ABG  Recent Labs Lab 02/03/2017 1939  PHART 7.375  PCO2ART 39.1  PO2ART 57.0*    Liver Enzymes  Recent Labs Lab 01/23/2017 1937  01/26/17 0206 01/27/17 0301 01/28/17 0307  AST 47*  --   --   --   --   ALT 14*  --   --   --   --   ALKPHOS 102  --   --   --   --  BILITOT 0.9  --   --   --   --   ALBUMIN 1.3*  < > 1.3* 1.4* 1.4*  < > = values in this interval not displayed.  Cardiac Enzymes No results for input(s): TROPONINI, PROBNP in the last 168 hours.  Glucose  Recent Labs Lab 01/27/17 1520 01/27/17 1623 01/27/17 2047 01/28/17 0033 01/28/17 0415 01/28/17 0741  GLUCAP 128* 124* 140* 130* 142* 115*    Imaging No results found.   STUDIES:  CT CHEST W/ CONTRAST 7/29: IMPRESSION: 1. Significantly improved right-sided empyema. Small pockets of residual fluid are seen along the lateral chest wall and fissures. Pneumothorax is small and stable from chest x-ray earlier today. 2. Pneumonia and atelectasis on the right, greatest in the lower lobe. 3. Diminished concern for obstructing airway lesion when compared to admission CT, when there was bronchial distortion due to the pleural effusion. Recommend three-month follow-up CT in this smoker  with nonspecific adenopathy. 4. Small upper abdominal ascites that is newly seen. 5. Aortic Atherosclerosis (ICD10-I70.0) and Emphysema (ICD10-J43.9). 6. 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 PORT CXR 7/30:  Minimal residual right pleural effusion. Minimal residual right pneumothorax. Right-sided chest tube directed caudally. PORT CXR 7/31:  Small right apical pneumothorax. No appreciable change compared with chest x-ray imaging from last evening. No new focal opacity appreciated. No clear evolution of or development of right pleural effusion. CT CHEST W/ CONTRAST 8/4: IMPRESSION: 1. Large complex collections of fluid and air throughout the right chest, locations and measurements given above, largest collection along the posterior right chest measuring over 20 cm greatest dimension, presumed empyemas. These findings represent a significant worsening compared to the earlier chest CT of 01/16/2017. These collections appear to be contiguous but are also likely multiloculated to some degree. Right-sided chest tube has been removed in the interval. 2. Moderate-sized left pleural effusion. 3. Mediastinal and perihilar lymphadenopathy is likely reactive in nature. 4. Upper abdominal ascites, incompletely imaged. PORT CXR 8/5:   Right-sided chest tube in good position. Endotracheal tube in good position. Enteric feeding tube coursing below diaphragm. Right lung opacities persistent. CT CHEST W/O 8/7: IMPRESSION: 1. Multifactorial degradation, as detailed above. 2. Similar appearance and configuration of multiple loculated right-sided pleural fluid collections, most consistent with empyemas. Small foci of pleural gas are not significantly changed.  3. Placement of a right-sided large-bore chest tube since the prior CT. The chest tube is surrounded by pulmonary parenchyma, and is likely not sufficiently draining the pleural space. 4.  Emphysema (ICD10-J43.9). 5. Coronary  artery atherosclerosis. Aortic Atherosclerosis (ICD10-I70.0). 6. Similar small left pleural effusion. 7. Upper abdominal ascites. PORT CXR 8/8:  Enteric feeding tube coursing below diaphragm. Endotracheal tube in good position. Right chest tube in good position. Slight improvement in right lung patchy opacification with residual pleural effusion still present. PORT CXR 8/8:  Endotracheal and enteric feeding tubes removed. Right chest tube removed. Lordotic view with slight rotation to the right. Increased opacity right lung with residual effusion.   MICROBIOLOGY: MRSA PCR 7/24:  Negative Blood Cultures x2 7/24:  Negative  Right BAL 7/24:  Multiple Organisms Right Pleural Fluid culture 7/24:  Viridans streptococci  MRSA PCR 8/1:  Negative Blood Cultures x2 8/3 >>> Urine Culture 8/3:  Negative  MRSA PCR 8/4:  Negative  Tracheal Aspirate Culture 8/6:  Negative  Urine Culture 8/6:  Negative  Blood Cultures x2 8/6 >>>  ANTIBIOTICS: Vancomycin 7/24 - 7/25 Zosyn 7/24 - 7/28 Ancef 7/28 - 8/3  Vancomycin 8/3  - 8/8  Zosyn 8/3 >>>  SIGNIFICANT EVENTS: 7/23 - admit to [facility] 7/24 - rapid response, intubated, bronchoscopy, R chest tube placed with ~3L milky yellow pus drained 7/29 - chest tube to water seal w/ small residual pneumothorax 7/30 - chest tube removed w/ small residual right pneumothorax 8/03 - discharged & returned promptly  8/05 - self-extubated & was reintubated w/ 7.5 ETT from 8.0 ETT 8/06 - febrile >> recultured  8/08 - self-extubated despite restraints. Chest tube removed.  8/10 - on floor, no distress  LINES/TUBES: OETT 7.5 8/5 - 8/8 (self extubated twice now) OGT 8/5 - 8/8 R CHEST TUBE 8/5 - 8/8 PIV  ASSESSMENT / PLAN:  PULMONARY A: Acute Hypoxic Respiratory Failure:  Self-extubated x2 - last 8/9  Hemoptysis - occurred with intrapleural lytic therapy.  Right Empyema - S/P removal of chest tube 8/8. Apnea - No respiratory effort. Suspect secondary to Fentanyl  drip.   P:   Follow intermittent CXR Monitor saturations Pulmonary hygiene - IS, mobilize CT surgery following, appreciate input  INFECTIOUS A:   Streptococcus Pneumonia w/ Empyema  Sepsis  P:   Follow cultures above to maturity  Trend PCT  D8/x Zosyn   FAMILY  - Updates: No family on NP rounding 8/10.   PCCM will follow up 8/13. Please call if new needs arise sooner.    Noe Gens, NP-C [facility] Pgr: [phone] or if no answer [phone] 01/28/2017, 11:45 AM

## 2017-01-28 NOTE — Progress Notes (Signed)
  Speech Language Pathology Treatment: Dysphagia  Patient Details Name: Jay HibbsJessie Stephens MRN: [id number] DOB: 06/01/1946 Today's Date: 01/28/2017 Time: [id number] SLP Time Calculation (min) (ACUTE ONLY): 14 min  Assessment / Plan / Recommendation Clinical Impression  Diagnostic treatment complete. SLP provided po trials under skilled observation to determine readiness to resume a po diet. Patient with general improvements in function since initial evaluation 8/9 with seemingly intact airway protection with trials of pureed solids, vocal quality remaining clear after the swallow. With liquids however, patient with consistent wet vocal quality indicative of decreased airway protection. In light of persistent signs of aspiration at bedside, h/o dysphagia with aspiration PNA, and acute intubation, recommend proceeding with instrumental testing to determine least restrictive diet.    HPI HPI: Pt is a 71 y.o.debilitated man w hx malnutrition, HTN and diastolic dysfxn, recent aspiration PNA c/b S viridans R empyema. MBS recommended Dys 1 diet and nectar thick liquids by spoon. He was discharged to SNF on 8/3 but returned later the same day with encephalopathy, dyspnea, hypoxemia, transiently requiring BiPAP. BSE upon readmission recommended the same dysphagia diet and precautions. Repeat CT scan 8/4 shows multiple large fluid collections largest posterior R chest, other possibly contiguous collections. He was intubated on 8/5 with self-extubation and reintubation the same day. He self-extubated again on 8/8.      SLP Plan  MBS       Recommendations  Diet recommendations: NPO Medication Administration: Via alternative means                Oral Care Recommendations: Oral care QID Follow up Recommendations: Skilled Nursing facility SLP Visit Diagnosis: Dysphagia, oropharyngeal phase (R13.12) Plan: MBS       GO             Ferdinand LangoLeah Jahaziel Francois MA, CCC-SLP [phone]    Christian Treadway  Meryl 01/28/2017, 3:37 PM

## 2017-01-28 NOTE — Consult Note (Signed)
Consultation Note Date: 01/28/2017   Patient Name: Jay Stephens  DOB: [birth year]  MRN: [id number]  Age / Sex: 71 y.o., male  PCP: System, Pcp Not In Referring Physician: Leeanne Rio, MD  Reason for Consultation: Establishing goals of care and Psychosocial/spiritual support  HPI/Patient Profile: 71 y.o. male  with past medical history of protein calorie malnutrition, hypertension, diastolic heart failure, recent aspiration pneumonia with strep viridans, right  empyema with recent chest tube on 02/14/2017 to SNF for rehab but was re- admitted on 01/23/2017 acute dyspnea, encephalopathy. . Patient is treated with broad-spectrum antibiotics and transiently has required BiPAP. Repeat CT scan of the chest on 01/22/2017 shows multiple large fluid collections in the right chest.  . Patient has required intubation and has self extubated on 8/5 as well as 8/8. Per chest CT on 01/22/2017 right empyema has improved chest tube has been removed   Patient was seen by palliative medicine provider during previous hospitalization and goals were to hopefully improve with rehabilitation to come back home. Patient is currently nothing by mouth, with questionable acute aspiration event leading to this readmission.   Clinical Assessment and Goals of Care: Patient is alert but oriented only to himself. He is extremely weak and requires 2 person assist to achieve a standing position. Patient fell out of bed on 01/28/2017. CT scan of the head did not show any acute findings but did show stable atrophy, encephalomalacia. CT of the spine showed no injury. He was able to follow brief, simple commands with the physical therapists. He is asking for water  Patient's nephew, Fadil Macmaster, is his healthcare power of attorney, healthcare proxy.    SUMMARY OF RECOMMENDATIONS   Palliative medicine team to meet with patient's nephew, Aram Domzalski, at  11:00 on 01/29/2017 Per chart review of previous palliative medicine consult when provider met with patient's nephew, goals important to patient were to never be in a nursing home, and nephew shared that if patient was unable to eat that would be a significant decline in quality of of life  Code Status/Advance Care Planning:  Full code    Symptom Management:   No nonverbal signs and symptoms of pain or dyspnea at rest. Per review of MAR, no sedating medications on his profile  Palliative Prophylaxis:   Aspiration, Bowel Regimen, Delirium Protocol, Eye Care, Frequent Pain Assessment, Oral Care and Turn Reposition  Additional Recommendations (Limitations, Scope, Preferences):  Full Scope Treatment  Psycho-social/Spiritual:   Desire for further Chaplaincy support:no  Additional Recommendations: Grief/Bereavement Support  Prognosis:   Unable to determine  Discharge Planning: To Be Determined      Primary Diagnoses: Present on Admission: . Respiratory distress . Altered mental status . Protein-calorie malnutrition, severe ([place]) . Pneumothorax on right . Empyema ([place]) . 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 Obtundation . Aspiration pneumonia ([place]) . Ascites . Emphysema lung ([place]) . Aortic atherosclerosis ([place])   I have reviewed the medical record, interviewed the patient and family, and examined the patient. The following aspects are pertinent.  History reviewed. No pertinent past medical history. Social History   Social History  . Marital status: Unknown    Spouse name: N/A  . Number of children: N/A  . Years of education: N/A   Social History Main Topics  . Smoking status: Current Every Day Smoker    Packs/day: 0.50    Years: 15.00  . Smokeless tobacco: Current User  . Alcohol use None  . Drug use: Unknown  . Sexual activity: Not Asked   Other Topics Concern  . None   Social History Narrative  . None   History reviewed. No pertinent family  history. Scheduled Meds: . chlorhexidine gluconate (MEDLINE KIT)  15 mL Mouth Rinse BID  . heparin subcutaneous  5,000 Units Subcutaneous Q8H  . insulin aspart  2-6 Units Subcutaneous Q4H  . mouth rinse  15 mL Mouth Rinse QID   Continuous Infusions: . sodium chloride    . piperacillin-tazobactam (ZOSYN)  IV Stopped (01/28/17 1019)   PRN Meds:.acetaminophen, ipratropium-albuterol, RESOURCE THICKENUP CLEAR Medications Prior to Admission:  Prior to Admission medications   Medication Sig Start Date End Date Taking? Authorizing Provider  amoxicillin-clavulanate (AUGMENTIN) 875-125 MG tablet Take 1 tablet by mouth 2 (two) times daily. 02/06/2017 01/28/17 Yes Everrett Coombe, MD   No Known Allergies Review of Systems  Unable to perform ROS: Mental status change    Physical Exam  Constitutional:  Cachectic, frail older man in no acute distress  HENT:  Head: Normocephalic and atraumatic.  Cardiovascular: Normal rate and regular rhythm.   Pulmonary/Chest: Effort normal.  Musculoskeletal:  Very weak Two-person assist to standing position; was unable to ambulate  Neurological: He is alert.  Oriented to self only  Skin: Skin is warm and dry.  Psychiatric:  He is alert but confused  Nursing note and vitals reviewed.   Vital Signs: BP 126/79 (BP Location: Right Arm)   Pulse 89   Temp 97.9 F (36.6 C) (Axillary)   Resp 18   Ht 5' 9"  (1.753 m)   Wt 60.1 kg (132 lb 8 oz)   SpO2 92%   BMI 19.57 kg/m  Pain Assessment: No/denies pain   Pain Score: 0-No pain   SpO2: SpO2: 92 % O2 Device:SpO2: 92 % O2 Flow Rate: .O2 Flow Rate (L/min): 2 L/min  IO: Intake/output summary:  Intake/Output Summary (Last 24 hours) at 01/28/17 1500 Last data filed at 01/28/17 0981  Gross per 24 hour  Intake               50 ml  Output                0 ml  Net               50 ml    LBM: Last BM Date: 01/28/17 Baseline Weight: Weight: 64 kg (141 lb 1.5 oz) Most recent weight: Weight: 60.1 kg (132 lb 8  oz)     Palliative Assessment/Data:   Flowsheet Rows     Most Recent Value  Intake Tab  Referral Department  Hospitalist  Unit at Time of Referral  Med/Surg Unit  Palliative Care Primary Diagnosis  Sepsis/Infectious Disease  Date Notified  01/27/17  Palliative Care Type  Return patient Palliative Care  Reason for referral  Clarify Goals of Care, Psychosocial or Spiritual support  Date of Admission  02/10/2017  Date first seen by Palliative Care  01/28/17  # of days Palliative referral response time  1 Day(s)  # of days IP prior  to Palliative referral  6  Clinical Assessment  Palliative Performance Scale Score  30%  Pain Max last 24 hours  Not able to report  Pain Min Last 24 hours  Not able to report  Dyspnea Max Last 24 Hours  Not able to report  Dyspnea Min Last 24 hours  Not able to report  Nausea Max Last 24 Hours  Not able to report  Nausea Min Last 24 Hours  Not able to report  Anxiety Max Last 24 Hours  Not able to report  Anxiety Min Last 24 Hours  Not able to report  Other Max Last 24 Hours  Not able to report  Psychosocial & Spiritual Assessment  Palliative Care Outcomes  Patient/Family meeting held?  No  Who was at the meeting?  scheduled for 11am 8/11  Palliative Care Outcomes  Provided psychosocial or spiritual support  Palliative Care follow-up planned  Yes, Facility      Time In: 5183 Time Out: 1515 Time Total: 60 min Greater than 50%  of this time was spent counseling and coordinating care related to the above assessment and plan.  Signed by: Dory Horn, NP   Please contact Palliative Medicine Team phone at [phone] for questions and concerns.  For individual provider: See Shea Evans

## 2017-01-28 NOTE — Progress Notes (Addendum)
[facility]PTS Interim Progress Note  S:Called by Rapid Nurse for Jay Stephens being found by his floor nurse altered and trying to crawl out his bed with O2 desat and audibly rasping respirations w/ tachypnia.    O: BP (!) 130/97 (BP Location: Left Arm)   Pulse (!) 101   Temp (!) 97.5 F (36.4 C) (Axillary)   Resp (!) 38   Ht 5\' 9"  (1.753 m)   Wt 132 lb 8 oz (60.1 kg)   SpO2 91%   BMI 19.57 kg/m     A/P: Respiratory distress -He was able to react with eye contact to voice prompting but was not verbally responding -On arriving to room he was satting ~92 on 5L nasal canula -ordered CXR (extensive white out of R lung), ABG pO2 59, and blood cultures (patient currently on zosyn) -consulted CCM who came immediately to evaluate Jay Stephens -called nephew [place] [facility]([name] Rohl) to notify him of him of status and to clarify goals of care in case we needed a chest tube/intubation/chest compressions before the GOC meeting tommorow @11am .   Jay Stephens confirmed the patient was full code.   ###### Update: Troponin: 2.23, w/ AM ECG showing stchanges Called cadiology fellow who agreed to evaluate, likely due to hypoxia/anemia, will trend troponins and watch for clinical change   Marthenia RollingBland, Furious Chiarelli, DO 01/28/2017, 9:38 PM PGY-1, [facility] pager [phone]

## 2017-01-28 NOTE — Significant Event (Signed)
Rapid Response Event Note  Overview: Time Called: 2046 Arrival Time: 2050 Event Type: Respiratory  Initial Focused Assessment: Called by multiple RNs to assess patient.  Per nursing staff, patient was attempting to get out of bed, they found the patient in the bed, patient was altered, incontinent, was not following commands, and pull out his PIV.  Upon arrival, I could hear the patient's lung sounds from a distance, + rhonchi, + crackles, and minimal air movement.  RR in the mid 40s.  Patient did start to respond as time went on, but was still confused at times and has been.  + R sided empyema, patient did have a chest tube, which was removed 2 days ago. Patient self extubated yesterday on 8/9.  + edema bilateral legs. Patient did received NS 100cc, which was stopped earlier today.   Interventions: - RT was called to bedside, patient was NTS x 2. - I paged FMTS MDs to come assess the patient.  Patient was exhibiting signs of distress and concerned about airway protection and respiratory fatigue. - ABG, CXR, and labs ordered. ABG: PaO2 59, RT placed on 10 HFNC. - PCCM was consulted.  Plan of Care (if not transferred): - Lasix 40mg  IV, foley catheter to be placed, schedule Duo-Nebs, safety mitts and sitter. - Closely follow tonight.  -  Family was updated by [facility]MT[facility], per there request, patient is a full code.  Event Summary: Name of Physician Notified: [facility] Residents at 2105    at    Outcome: Stayed in room and stabalized  Event End Time: 2210  Carlin Attridge R

## 2017-01-28 NOTE — Progress Notes (Signed)
CPT not done at this time due to patient not in room. Will attempt again at 1600

## 2017-01-28 NOTE — NC FL2 (Signed)
[place] LEVEL OF CARE SCREENING TOOL     IDENTIFICATION  Patient Name: Jay Stephens Birthdate: 1945/08/25 Sex: male Admission Date (Current Location): 02/09/2017  [facility] and [place] Number:  Herbalist and Address:  The [place], [place] [address], [place], [place] [address]      Provider Number: [id number]  Attending Physician Name and Address:  Leeanne Rio, MD  Relative Name and Phone Number:  Trilby Drummer nephew, [phone]    Current Level of Care: Hospital Recommended Level of Care: [place] Prior Approval Number:    Date Approved/Denied:   PASRR Number: [id number] ([place] Must as Susa Simmonds)  Discharge Plan: SNF    Current Diagnoses: Patient Active Problem List   Diagnosis Date Noted  . Obtundation 01/22/2017  . Aspiration pneumonia ([place]) 01/22/2017  . Ascites 01/22/2017  . Emphysema lung ([place]) 01/22/2017  . Aortic atherosclerosis ([place]) 01/22/2017  . Echocardiogram shows left ventricular diastolic dysfunction, Grade Two 01/22/2017  . Respiratory distress 02/06/2017  . Pressure ulcer of sacrum 01/27/2017  . Pleural effusion on left   . Pneumothorax on right   . Palliative care encounter   . Protein-calorie malnutrition, severe ([place]) 01/11/2017  . Acute respiratory failure with hypoxia ([place])   . Empyema ([place])   . 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 Altered mental status 01/16/2017    Orientation RESPIRATION BLADDER Height & Weight     Self  Normal Incontinent, External catheter Weight: 132 lb 8 oz (60.1 kg) Height:  _0  (175.3 cm)  BEHAVIORAL SYMPTOMS/MOOD NEUROLOGICAL BOWEL NUTRITION STATUS      Incontinent Diet (please see DC summary)  AMBULATORY STATUS COMMUNICATION OF NEEDS Skin   Extensive Assist Verbally PU Stage and Appropriate Care, Other (Comment) (pressure injury sacrum foam dressing; chest incisions R and L gauze)                       Personal Care Assistance  Level of Assistance  Bathing, Feeding, Dressing Bathing Assistance: Maximum assistance Feeding assistance: Limited assistance Dressing Assistance: Maximum assistance     Functional Limitations Info             SPECIAL CARE FACTORS FREQUENCY  PT (By licensed PT), OT (By licensed OT)     PT Frequency: 5x/week OT Frequency: 5x/week            Contractures      Additional Factors Info  Code Status, Allergies, Insulin Sliding Scale Code Status Info: Full Allergies Info: NKA   Insulin Sliding Scale Info: insulin every 4 hours       Current Medications (01/28/2017):  This is the current hospital active medication list Current Facility-Administered Medications  Medication Dose Route Frequency Provider Last Rate Last Dose  . acetaminophen (TYLENOL) suppository 650 mg  650 mg Rectal Q6H PRN Javier Glazier, MD      . chlorhexidine gluconate (MEDLINE KIT) (PERIDEX) 0.12 % solution 15 mL  15 mL Mouth Rinse BID Criss Rosales, Scott, DO   15 mL at 01/27/17 2131  . heparin injection 5,000 Units  5,000 Units Subcutaneous Q8H Javier Glazier, MD   5,000 Units at 01/28/17 [phone]  . insulin aspart (novoLOG) injection 2-6 Units  2-6 Units Subcutaneous Q4H Collene Gobble, MD   2 Units at 01/26/17 [phone]  . ipratropium-albuterol (DUONEB) 0.5-2.5 (3) MG/3ML nebulizer solution 3 mL  3 mL Nebulization Q4H PRN Chesley Mires, MD   3 mL at  01/25/17 0137  . MEDLINE mouth rinse  15 mL Mouth Rinse QID Chesley Mires, MD   15 mL at 01/28/17 0622  . piperacillin-tazobactam (ZOSYN) IVPB 3.375 g  3.375 g Intravenous Q8H Erenest Blank, RPH 12.5 mL/hr at 01/28/17 0619 3.375 g at 01/28/17 1610  . RESOURCE THICKENUP CLEAR   Oral PRN McDiarmid, Blane Ohara, MD         Discharge Medications: Please see discharge summary for a list of discharge medications.  Relevant Imaging Results:  Relevant Lab Results:   Additional Information SSN: [id number]       Estanislado Emms, LCSW

## 2017-01-28 NOTE — Progress Notes (Signed)
At approximately 0340 pt was found sitting on the floor beside the bed leaned up against the bed facing the window. Pt sustained no injuries and vital signs were stable. MD and family notified

## 2017-01-28 NOTE — Consult Note (Signed)
Patient well known to our service, [phone] with Emphysema, Diastolic CHF, HTN, and recent aspiration pneumonia with strep viridans empyema, treated with chest tube then discharged to SNF, readmitted 8/3 with AMS, SOB, Hypoxia requiring intubation. Chest tube removed on 8/8. Patient self-extubated on 8/9. Was doing well today until this evening when patient found slumped over in bed, Pox 91% on 5L O2. ABG confirmed hypoxia but no hypercapnea. CXR on my review shows right-sided increased interstitial markings and infiltrates, not changed from prior CXR on 8/9.   Clinically, at time of my exam, patient afebrile, RR in mid 20's with no accessory muscle use. Pox 100% on 10L. Lungs with loud rhonchi b/l and 2+ BLE edema. This is consistent with diagnosis of diastolic CHF exacerbation in setting of being on NS gtt. Will stop NS gtt. Give Lasix 40mg  IV once. Check BNP. Titrated O2 down to 2L O2 with Pox still in high 90's. Change Duonebs from PRN (wasnt getting any) to scheduled. Place foley. Place mitts on patient as he is pulling out IV's due to delirium. Continue strict NPO. Order 1:1 sitter for his room. Defer treatment of delirium to primary team. I will check back by later tonight but at this time he doesn't need transfer to ICU.   35 minutes critical care time  Milana ObeyKathleen Hammonds, MD Pulmonary and Critical Care

## 2017-01-28 NOTE — Progress Notes (Signed)
Nutrition Follow-up  DOCUMENTATION CODES:   Severe malnutrition in context of chronic illness  INTERVENTION:    Given severe PCM and dsyphagia, consider placing Cortrak tube for enteral nutrition: Jevity 1.2 at 70 ml/h would provide 2016 kcal, 93 gm protein, 1361 ml free water daily  NUTRITION DIAGNOSIS:   Malnutrition (severe) related to chronic illness (dysphagia, dental abscess) as evidenced by severe depletion of body fat, severe depletion of muscle mass.  Ongoing  GOAL:   Patient will meet greater than or equal to 90% of their needs  Unmet  MONITOR:   Diet advancement, PO intake, Weight trends, Labs, I & O's  ASSESSMENT:   71 yo male who was just discharged on 8/3 (AMS & R sided empyema) to SNF. He has PMH of severe malnutrition, tobacco abuse, recent tooth extractions & I&D for dental abscess. He was re-admitted on 8/3 with respiratory distress.   Patient self-extubated on 8/8.  TF off since self extubation. Remains NPO. SLP following for ability to complete MBS; patient needs more time before testing. He is at a high risk for aspiration with hx of silent aspiration, dysphagia, and recent 5 day intubation. Labs reviewed: sodium 147 (H); phosphorus and magnesium WNL CBG's: 130-142-115 Medications reviewed.  Diet Order:  Diet NPO time specified  Skin:  Wound (see comment) (stage II sacrum)  Last BM:  8/10  Height:   Ht Readings from Last 1 Encounters:  01/27/17 5\' 9"  (1.753 m)    Weight:   Wt Readings from Last 1 Encounters:  01/27/17 132 lb 8 oz (60.1 kg)    Ideal Body Weight:  72.7 kg  BMI:  Body mass index is 19.57 kg/m.  Estimated Nutritional Needs:   Kcal:  1800-2100  Protein:  90-110 gm  Fluid:  2 L  EDUCATION NEEDS:   No education needs identified at this time  Joaquin CourtsKimberly Harris, RD, LDN, CNSC Pager [phone] After Hours Pager [phone]
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
## 2017-01-28 NOTE — Progress Notes (Signed)
      [address]       [place] [id number]             [phone]      Responsive, answers questions  Denies pain and shortness of breath  BP 126/79 (BP Location: Right Arm)   Pulse 89   Temp 97.9 F (36.6 C) (Axillary)   Resp 18   Ht 5\' 9"  (1.753 m)   Wt 132 lb 8 oz (60.1 kg)   SpO2 92%   BMI 19.57 kg/m    Intake/Output Summary (Last 24 hours) at 01/28/17 1645 Last data filed at 01/28/17 1600  Gross per 24 hour  Intake           221.67 ml  Output                0 ml  Net           221.67 ml   Decreased BS on right.  Palliative care has been consulted to establish goals of care.   Salvatore DecentSteven C. Dorris FetchHendrickson, MD [facility] [phone]

## 2017-01-28 NOTE — Progress Notes (Signed)
Occupational Therapy Evaluation Patient Details Name: Jay HibbsJessie Harlan MRN: [id number] DOB: 09/12/1945 Today's Date: 01/28/2017    History of Present Illness 71 year old readmitted from SNF with AMS and dyspnea on 8/3, the same day he was discharged. Required intubation with self extubation both 8/5 and 8/8 and chest tube discontinued 8/4. PMH: recent aspiration PNA with empyema requiring chest tube and prolonged hospitalization, protein calorie malnutrition, HTN, diastolic heart failure.   Clinical Impression   No family available for PLOF. Pt presents with generalized weakness, impaired cognition and poor standing balance. Requires min to total assist for ADL,.  He requires 2 person assist for OOB mobility. Will follow acutely. Recommending d/c to SNF upon discharge.    Follow Up Recommendations  SNF;Supervision/Assistance - 24 hour    Equipment Recommendations       Recommendations for Other Services       Precautions / Restrictions Precautions Precautions: Fall Restrictions Weight Bearing Restrictions: No      Mobility Bed Mobility Overal bed mobility: Needs Assistance Bed Mobility: Supine to Sit;Sit to Sidelying     Supine to sit: Min assist   Sit to sidelying: Min assist General bed mobility comments: increased time, cues for technique, assist to raise trunk and assist LEs into bed  Transfers Overall transfer level: Needs assistance Equipment used: Rolling walker (2 wheeled) Transfers: Sit to/from Stand Sit to Stand: Min assist;+2 physical assistance         General transfer comment: assist to rise and steady, pt with buckling and hyperextension of L LE    Balance Overall balance assessment: Needs assistance   Sitting balance-Leahy Scale: Fair     Standing balance support: Bilateral upper extremity supported Standing balance-Leahy Scale: Poor Standing balance comment: requires assist for balance while holding urinal                             ADL either performed or assessed with clinical judgement   ADL Overall ADL's : Needs assistance/impaired Eating/Feeding: NPO   Grooming: Wash/dry hands;Sitting;Min guard   Upper Body Bathing: Moderate assistance;Sitting   Lower Body Bathing: Sit to/from stand;Total assistance   Upper Body Dressing : Minimal assistance;Sitting   Lower Body Dressing: Total assistance       Toileting- Clothing Manipulation and Hygiene: Total assistance;Sit to/from stand               Vision         Perception     Praxis      Pertinent Vitals/Pain Pain Assessment: No/denies pain (Simultaneous filing. User may not have seen previous data.) Faces Pain Scale: No hurt     Hand Dominance Right   Extremity/Trunk Assessment Upper Extremity Assessment Upper Extremity Assessment: Generalized weakness   Lower Extremity Assessment Lower Extremity Assessment: Defer to PT evaluation   Cervical / Trunk Assessment Cervical / Trunk Assessment: Kyphotic   Communication Communication Communication: HOH   Cognition Arousal/Alertness: Awake/alert Behavior During Therapy: Flat affect Overall Cognitive Status: Impaired/Different from baseline Area of Impairment: Safety/judgement;Following commands;Problem solving;Memory;Orientation                 Orientation Level: Disoriented to;Place;Time;Situation   Memory: Decreased short-term memory Following Commands: Follows one step commands with increased time Safety/Judgement: Decreased awareness of safety;Decreased awareness of deficits   Problem Solving: Slow processing;Decreased initiation;Difficulty sequencing;Requires verbal cues;Requires tactile cues     General Comments       Exercises     Shoulder Instructions  Home Living Family/patient expects to be discharged to:: Skilled nursing facility Living Arrangements: Other relatives (nephew)                               Additional Comments: family was  displaced and living in hotel      Prior Functioning/Environment          Comments: unknown        OT Problem List: Decreased strength;Decreased range of motion;Decreased activity tolerance;Impaired balance (sitting and/or standing);Decreased cognition;Decreased coordination;Decreased safety awareness;Decreased knowledge of use of DME or AE;Impaired UE functional use      OT Treatment/Interventions: Self-care/ADL training;DME and/or AE instruction;Therapeutic activities;Cognitive remediation/compensation;Patient/family education;Balance training    OT Goals(Current goals can be found in the care plan section) Acute Rehab OT Goals Patient Stated Goal: to drink water OT Goal Formulation: Patient unable to participate in goal setting Time For Goal Achievement: 02/11/17 Potential to Achieve Goals: Good ADL Goals Pt Will Perform Grooming: with min assist;standing Pt Will Perform Upper Body Dressing: with supervision;sitting Pt Will Perform Lower Body Dressing: with min assist;sit to/from stand Pt Will Transfer to Toilet: with min assist;ambulating;bedside commode Pt Will Perform Toileting - Clothing Manipulation and hygiene: with min assist;sit to/from stand  OT Frequency: Min 2X/week   Barriers to D/C:            Co-evaluation PT/OT/SLP Co-Evaluation/Treatment: Yes Reason for Co-Treatment: For patient/therapist safety   OT goals addressed during session: ADL's and self-care      AM-PAC PT "6 Clicks" Daily Activity     Outcome Measure Help from another person eating meals?: Total Help from another person taking care of personal grooming?: A Little Help from another person toileting, which includes using toliet, bedpan, or urinal?: Total Help from another person bathing (including washing, rinsing, drying)?: A Lot Help from another person to put on and taking off regular upper body clothing?: A Little Help from another person to put on and taking off regular lower body  clothing?: Total 6 Click Score: 11   End of Session Equipment Utilized During Treatment: Rolling walker;Gait belt  Activity Tolerance: Patient tolerated treatment well Patient left: in bed;with call bell/phone within reach;with bed alarm set  OT Visit Diagnosis: Unsteadiness on feet (R26.81);Muscle weakness (generalized) (M62.81);Other symptoms and signs involving cognitive function                Time: 1423-1443 OT Time Calculation (min): 20 min Charges:  OT General Charges $OT Visit: 1 Procedure OT Evaluation $OT Eval Moderate Complexity: 1 Procedure G-Codes:       Evern Bio 01/28/2017, 3:47 PM  [phone]

## 2017-01-28 NOTE — Progress Notes (Signed)
Family Medicine Teaching Service Daily Progress Note Intern Pager: [phone]  Patient name: Jay Stephens Medical record number: [id number] Date of birth: 1945-11-16 Age: 71 y.o. Gender: male  Primary Care Provider: System, Pcp Not In Consultants: CCM, Cardio, palliative Code Status: full  Pt Overview and Major Events to Date:  Jay Stephens a 32 y.o.malepresenting with acute onset respiratory distress with desaturations into the 70s after aspirating on a meal at SNF. PMH is significant for right sided empyema s/p drainage via chest tube, altered mental status, R facial droop, tooth abscess s/p extraction and I&D, protein-calorie malnutrition, electrolyte abnormalities and generalized weakness.  While in ICU he selfextubated twice and anticoagulation through chest tube failed to completely evacuate empyema.  He was transferred to [facility] and on evening of 8/10 had oxygen desaturation with worsening lung sounds and troponin 2.23 with ECG showing ST changes, cardio/ccm were consulted and nephew confirmed full code over the phone.  Empyema vs Pneumonia: Most recent CXR shows significant consolidation of R lung, patient went into respiratory distress last night and required suction and O2 via nasal canula but recovered.   Prior empyema cultures showed viridans and patient on zosyn currently.   CCM following respiratory distress from eveing 8/10 -on med-surg with ccm/card consulting -patient has self-extubated twice during prior ICU stay -on 5L O2 nasal canula -continue zosyn -continuous pulse ox -am CBC, BMP -new blood cultures drawn -follow urine culture -hold fluids for now -NPO -vitals per unit routine  Elevated troponin/ST changes -initial 2.23, serial repeats ordered and decreased to 1.91, 1.78 -ECG showed ST changes -evaluated by cardiology, determined to be likely due to hypoxia from respiratory issues and anemia. -Cardiology expects troponins may peak as high as 5 and then start  to resolve, they will follow -call cardiology if new symptoms develop  AMS with R-sided facial droop and weakness:Patient is alert but not oriented.  -at baseline for this visit  Tooth abscess s/p extraction and I&D:Patient denied pain in mouth. Since the extraction occurred so recently (on 8/1), will need to be monitored for any infection or bleeding. -continue to monitor daily -chlorhexadine mouthwash TID until 8/15  Protein-calorie malnutrition: Patient is cachectic, which is likely due to a chronic illness such as cancer as well as his low function at baseline. Nephew reported earlier that he has to be fed at home. Albumin on admission is 1.4. Seen by nutrition and palliative care during his recent hospitalization.  -monitor Mag, Phos, K daily for refeeding syndrome -continue to follow nutrition and palliative care recs -discuss how to maintain nutrition on AMS patient who is not intubated  Fall protection -patient was found on the floor by his bed in the AM -sitter ordered but nursing informed us that no sitters available, tele-sitter ordered  Electrolyte abnormalities: patient experienced hypernatremia and hyperchloremia as well as hypocalcemia during previous admission. In the ED, Na 136, K 4.5, Ca 7.5. -daily BMP  Sacral ulcer: -foam/turning Q2 per nursing protocol  Pain control: -tylenol prn  FEN/GI:NPO Prophylaxis:Lovenox  Disposition:return to nursing home pending clearance and 24hrs w/o sitter/restraints, family confirmed over phone on 8/10 that [name] is full code  Subjective:  Unable to verbally interact  Objective: Temp:  [97.5 F (36.4 C)-98.8 F (37.1 C)] 98.8 F (37.1 C) (08/11 0455) Pulse Rate:  [89-101] 99 (08/11 0620) Resp:  [18-38] 35 (08/11 0620) BP: (97-130)/(63-97) 99/73 (08/11 0620) SpO2:  [91 %-100 %] 100 % (08/11 8657) Physical Exam: General: frail and disoriented,not responding verbally but will make  eye contact on verbal  comman Eyes: EOMI ENTM: dry mucous membranes Cardiovascular: RRR, no murmur, no JVD Respiratory: left lung rhonchi, right lung decreased sounds w/ rhonchi, was tachypnic but responded well to O2 nasal canula and suction Gastrointestinal: +bowel sounds, nontender/soft belly MSK: thin extremities, generalized weakness Neuro: motor control intact but not interacting at baseline Psych: disoriented more than usual, not verbally responding  Laboratory:  Recent Labs Lab 01/27/17 0301 01/28/17 0307 01/29/17 0355  WBC 4.3 4.7 4.1  HGB 7.3* 7.8* 7.7*  HCT 23.8* 26.1* 25.3*  PLT 219 255 246    Recent Labs Lab 01/27/17 0301 01/28/17 0307 01/29/17 0355  NA 147* 147* 150*  K 4.4 4.0 3.8  CL 108 109 111  CO2 26 29 25   BUN 20 25* 27*  CREATININE 1.17 1.28* 1.37*  CALCIUM 7.6* 7.7* 7.9*  GLUCOSE 148* 141* 110*    Tropnins 2.23, 1.91, 1.78  Imaging/Diagnostic Tests: Ct Chest W Contrast  Result Date: 01/22/2017 CLINICAL DATA:  Status post chest tube for right empyema. EXAM: CT CHEST WITH CONTRAST TECHNIQUE: Multidetector CT imaging of the chest was performed during intravenous contrast administration. CONTRAST:  75mL ISOVUE-300 IOPAMIDOL (ISOVUE-300) INJECTION 61% COMPARISON:  Chest CT dated 01/16/2017. Chest x-ray from earlier same day. FINDINGS: Cardiovascular: Heart size is normal. No pericardial effusion. Coronary artery calcifications. No thoracic aortic aneurysm or dissection. Mediastinum/Nodes: Scattered small and moderate-sized lymph nodes within the mediastinum and perihilar regions, likely reactive in nature. Trachea and central bronchi are unremarkable. Lungs/Pleura: Large complex collections are seen throughout the right chest, major components probably contiguous but also likely multiloculated to some degree. Dominant collection of fluid and air along the posterior aspects of the right upper lobe and right lower lobe measures over 23 cm craniocaudal dimension (series 7, image 66)  and approximately 12 x 7 cm transverse by AP dimensions near the right lung base (series 7, image 61; series 3, image 126). The posterior collection appears contiguous with an additional large complex collection of fluid and air at the right lung apex (probable connection seen on series 3, image 52). The complex collection in the right lung apex measures 14 cm craniocaudal dimension and 5 cm thickness (series 7, image 54; series 3, image 63). Lastly, additional complex fluid collection is seen along the right lateral chest wall measuring 9 cm AP dimension and 2.5 cm thickness (series 3, image 80). This collection does appear contiguous with the collection at the right lung apex. Moderate-size layering pleural effusion on the left with adjacent compressive atelectasis. Upper Abdomen: Free fluid/ascites within the upper abdomen, at least a moderate amount, incompletely imaged. Musculoskeletal: No acute or suspicious osseous finding. Right-sided chest tube seen on CT of 01/16/2017 has been removed. IMPRESSION: 1. Large complex collections of fluid and air throughout the right chest, locations and measurements given above, largest collection along the posterior right chest measuring over 20 cm greatest dimension, presumed empyemas. These findings represent a significant worsening compared to the earlier chest CT of 01/16/2017. These collections appear to be contiguous but are also likely multiloculated to some degree. Right-sided chest tube has been removed in the interval. 2. Moderate-sized left pleural effusion. 3. Mediastinal and perihilar lymphadenopathy is likely reactive in nature. 4. Upper abdominal ascites, incompletely imaged. These results were called by telephone at the time of interpretation on 01/22/2017 at 11:00 am to Dr. Acquanetta BellingDD MCDIARMID , who verbally acknowledged these results. Electronically Signed   By: Bary RichardStan  Maynard M.D.   On: 01/22/2017 11:17  Dg Chest Port 1 View  Result Date: 01/22/2017 CLINICAL  DATA:  Empyema.  Status post chest tube placement. EXAM: PORTABLE CHEST 1 VIEW COMPARISON:  CT chest 0 8/0 scratch the CT chest earlier today. Single-view of the chest 02/06/2017. FINDINGS: A new right chest tube is in place. Loculated pleural fluid collections in the right chest do not appear changed. Airspace disease in the right lung is also not notably changed. The left lung is expanded and clear. Left pleural effusion is noted. IMPRESSION: No marked change in loculated right pleural effusions and airspace disease with a new chest tube in place. Negative for pneumothorax. Small left pleural effusion seen on prior CT scan is not well demonstrated on this exam. Electronically Signed   By: Drusilla Kanner M.D.   On: 01/22/2017 15:51   Dg Chest Portable 1 View  Result Date: 01/30/2017 CLINICAL DATA:  Shortness of breath.  History of empyema. EXAM: PORTABLE CHEST 1 VIEW COMPARISON:  PA and lateral chest 01/28/2017 and 01/18/2017. CT chest 01/16/2017. FINDINGS: Pleural effusion which appears loculated along the right chest wall is again seen. Right basilar effusion is new since the most recent examination. There is new airspace disease throughout the right chest. The left lung is clear. Heart size is normal. IMPRESSION: New airspace disease throughout the right chest with a new right basilar effusion worrisome for pneumonia. Loculated right pleural effusion does not appear notably changed since the most recent exam. Electronically Signed   By: Drusilla Kanner M.D.   On: 01/30/2017 20:03     Marthenia Rolling, DO 01/29/2017, 10:46 AM PGY-1, [facility] FPTS Intern pager: [phone], text pages welcome

## 2017-01-28 NOTE — Progress Notes (Signed)
After arterial blood gas results changed patients oxygen to 10lpm high flow cannula.

## 2017-01-28 NOTE — Progress Notes (Signed)
OT Cancellation Note  Patient Details Name: Jay Stephens MRN: [id number] DOB: 01/08/1946   Cancelled Treatment:    Reason Eval/Treat Not Completed: Patient at procedure or test/ unavailable (Will follow.)  Evern BioMayberry, Jniya Madara Lynn 01/28/2017, 12:46 PM

## 2017-01-28 NOTE — Care Management Important Message (Signed)
Important Message  Patient Details  Name: Ferd HibbsJessie Mcmaster MRN: [id number] Date of Birth: 02/24/1946   Medicare Important Message Given:  Yes    [name], Macil Crady Abena 01/28/2017, 9:29 AM

## 2017-01-29 ENCOUNTER — Inpatient Hospital Stay (HOSPITAL_COMMUNITY): Payer: Medicare Other

## 2017-01-29 LAB — CULTURE, BLOOD (ROUTINE X 2)
CULTURE: NO GROWTH
Culture: NO GROWTH
SPECIAL REQUESTS: ADEQUATE
Special Requests: ADEQUATE

## 2017-01-29 LAB — CBC
HEMATOCRIT: 25.3 % — AB (ref 39.0–52.0)
HEMATOCRIT: 25.2 % — AB (ref 39.0–52.0)
HEMOGLOBIN: 7.5 g/dL — AB (ref 13.0–17.0)
Hemoglobin: 7.7 g/dL — ABNORMAL LOW (ref 13.0–17.0)
MCH: 26.2 pg (ref 26.0–34.0)
MCH: 25.7 pg — AB (ref 26.0–34.0)
MCHC: 30.4 g/dL (ref 30.0–36.0)
MCHC: 29.8 g/dL — AB (ref 30.0–36.0)
MCV: 86.1 fL (ref 78.0–100.0)
MCV: 86.3 fL (ref 78.0–100.0)
Platelets: 246 10*3/uL (ref 150–400)
Platelets: 253 10*3/uL (ref 150–400)
RBC: 2.94 MIL/uL — ABNORMAL LOW (ref 4.22–5.81)
RBC: 2.92 MIL/uL — ABNORMAL LOW (ref 4.22–5.81)
RDW: 16.5 % — AB (ref 11.5–15.5)
RDW: 16.4 % — AB (ref 11.5–15.5)
WBC: 4.1 10*3/uL (ref 4.0–10.5)
WBC: 5 10*3/uL (ref 4.0–10.5)

## 2017-01-29 LAB — BASIC METABOLIC PANEL
Anion gap: 14 (ref 5–15)
BUN: 27 mg/dL — AB (ref 6–20)
CALCIUM: 7.9 mg/dL — AB (ref 8.9–10.3)
CO2: 25 mmol/L (ref 22–32)
Chloride: 111 mmol/L (ref 101–111)
Creatinine, Ser: 1.37 mg/dL — ABNORMAL HIGH (ref 0.61–1.24)
GFR calc Af Amer: 59 mL/min — ABNORMAL LOW (ref >60)
GFR, EST NON AFRICAN AMERICAN: 51 mL/min — AB (ref >60)
GLUCOSE: 110 mg/dL — AB (ref 65–99)
Potassium: 3.8 mmol/L (ref 3.5–5.1)
Sodium: 150 mmol/L — ABNORMAL HIGH (ref 135–145)

## 2017-01-29 LAB — GLUCOSE, CAPILLARY
GLUCOSE-CAPILLARY: 101 mg/dL — AB (ref 65–99)
GLUCOSE-CAPILLARY: 105 mg/dL — AB (ref 65–99)
GLUCOSE-CAPILLARY: 142 mg/dL — AB (ref 65–99)
GLUCOSE-CAPILLARY: 55 mg/dL — AB (ref 65–99)
Glucose-Capillary: 85 mg/dL (ref 65–99)
Glucose-Capillary: 132 mg/dL — ABNORMAL HIGH (ref 65–99)
Glucose-Capillary: 58 mg/dL — ABNORMAL LOW (ref 65–99)
Glucose-Capillary: 91 mg/dL (ref 65–99)

## 2017-01-29 LAB — TROPONIN I
Troponin I: 1.78 ng/mL (ref <0.03)
Troponin I: 3.01 ng/mL (ref <0.03)

## 2017-01-29 LAB — PREPARE RBC (CROSSMATCH)

## 2017-01-29 MED ORDER — DEXTROSE 50 % IV SOLN
INTRAVENOUS | Status: AC
  Administered 2017-01-29: 50 mL
  Filled 2017-01-29: qty 50

## 2017-01-29 MED ORDER — DEXTROSE 50 % IV SOLN
INTRAVENOUS | Status: AC
  Filled 2017-01-29: qty 50

## 2017-01-29 MED ORDER — FUROSEMIDE 10 MG/ML IJ SOLN
40.0000 mg | Freq: Once | INTRAMUSCULAR | Status: DC
  Filled 2017-01-29: qty 4

## 2017-01-29 MED ORDER — FUROSEMIDE 10 MG/ML IJ SOLN
20.0000 mg | Freq: Once | INTRAMUSCULAR | Status: AC
  Administered 2017-01-29: 20 mg via INTRAVENOUS
  Filled 2017-01-29: qty 2

## 2017-01-29 MED ORDER — IPRATROPIUM-ALBUTEROL 0.5-2.5 (3) MG/3ML IN SOLN
3.0000 mL | Freq: Two times a day (BID) | RESPIRATORY_TRACT | Status: DC
  Administered 2017-01-29 – 2017-02-04 (×12): 3 mL via RESPIRATORY_TRACT
  Filled 2017-01-29 (×13): qty 3

## 2017-01-29 MED ORDER — DEXTROSE 50 % IV SOLN
1.0000 | Freq: Once | INTRAVENOUS | Status: AC
  Administered 2017-01-29: 50 mL via INTRAVENOUS

## 2017-01-29 MED ORDER — DEXTROSE 10 % IV SOLN
INTRAVENOUS | Status: DC
Start: 2017-01-29 — End: 2017-01-31
  Administered 2017-01-29: 22:00:00 via INTRAVENOUS

## 2017-01-29 MED ORDER — SODIUM CHLORIDE 0.9 % IV SOLN: Freq: Once | INTRAVENOUS | Status: DC

## 2017-01-29 NOTE — Plan of Care (Signed)
Problem: Safety: Goal: Ability to remain free from injury will improve Outcome: Progressing Came into room at beginning of shift and pt had pulled out his IV while attempting to get out of the bed. Pt was confused, hypoxic and not responding. Rapid response was called. Pt placed on high flow [place] at 4L and pt at 95% O2. Pt started responding and returned to baseline. Crackles auscultated in R lung and diminished L. Order for lasix obtained and NS was discontinued. Order for safety sitter to be in room with patient and AVA monitor currently on and monitoring the pt in his room. Pt also has safety mitts on to prevent pulling out his IV and newly placed foley catheter (for IV diuresis). Pt currently has had an output of 1000 ml and lungs sound better, however crackles are still audible. Fall risk bundle in place and frequent checks performed. Pt turned q2h, foam sacral pad changed, heels floated to prevent breakdown. Will continue to monitor this shift.

## 2017-01-29 NOTE — Progress Notes (Signed)
Patient going down for barium swallow.  Will hold on CPT at this time.

## 2017-01-29 NOTE — Progress Notes (Signed)
FPTS Interim Progress Note  S: Patient seen and examined at bedside. Noted to have episode of hypoglycemia requiring D50 at 2200. Additionally, hypoglycemic around 12PM requiring D50 earlier today. NPO per SLP and unsuccessful Cortrak placement today. Patient is alert and responsive, in NAD. He denies being short of breath currently.   Of note, he was on non-rebreather this AM due to dyspnea, received lasix yesterday evening which was held this AM due to low blood pressures. Throughout the day today he has been breathing comfortably on 2L by [place].  O: BP 124/76   Pulse 86   Temp 98.2 F (36.8 C)   Resp 16   Ht 5\' 9"  (1.753 m)   Wt 132 lb 8 oz (60.1 kg)   SpO2 99%   BMI 19.57 kg/m   GEN: Chronically-ill appearing gentleman rests comfortably in bed, responsive, not in acute distress and not acutely toxic-appearing CARD: RRR, no m/r/g PULM: CTA bil, no W/R/R ABD: soft LE: no LE edema bilaterally  A/P: Hypoglycemia - 2/2 NPO per SLP eval, cortrak placement not successful today.  Glucose 55 @8PM .  - s/p D50 - start D10 at slow rate (10 ml/hr) so as to give minimal fluid. Will have to monitor respiratory status closely, plan on diuresing again in the AM if blood pressures permit - continue checking CBGs  Dyspnea, improved - 2/2 R empyema and heart failure. Currently satting well and breathing comfortably on 2L by [place] with diminished lung sounds on right but no crackles appreciated throughout - monitor respiratory status closely with fluids - can consider repeat CXR tomorrow  Anemia - Hgb 7.7 on 5am labs and 7.5 on follow up lab 6 hours later.  Therefore 1u blood ordered. - 1u PRBC still hanging - 20u IV lasix after PRBC - will follow up post H&H   Will continue to monitor closely. Appreciate excellent nursing care.  Howard PouchFeng, Lessly Stigler, MD 01/29/2017, 8:57 PM PGY-2, [facility] pager [phone]

## 2017-01-29 NOTE — Progress Notes (Signed)
Modified Barium Swallow Progress Note  Patient Details  Name: Jay Stephens MRN: [id number] Date of Birth: 07/27/1945  Today's Date: 01/29/2017  Modified Barium Swallow completed.  Full report located under Chart Review in the Imaging Section.  Brief recommendations include the following:  Clinical Impression  Pt currently presenting with severe oropharyngeal dysphagia impacted by sensorimotor function post-intubation and cognitive status. Assessment limited given pt's severe aspiration risk and reduced attention to PO and verbal instructions. Oral stage characterized by weak lingual manipulation, reduced bolus cohesion, lingual pumping and piecemeal deglutition with puree. Once the bolus spilled to the valleculae, pt triggered an extremely weak swallow. Due to weak base of tongue retraction, minimal hyolaryngeal excursion and decreased pharyngeal constriction, 100% of the bolus remained in the valleculae post-swallow. Pt required max cuing to initiate a subsequent swallow, which was not effective in moving the bolus. Again with max cues, pt was ultimately able to swallow in chin tuck position, which aided in clearance of approximately 30% of the teaspoon-sized bolus through the UES. Given pt's decreased hyolaryngeal excursion and weak pharyngeal stripping wave, amplitude/duration of UES opening was decreased. Despite max cues, pt was unable to follow further instructions and was orally defensive with suction attempts. SLP provided 1/2 teaspoon thin water in an attempt to facilitate pt swallow reflex for pharyngeal clearance. Despite absence of contrast in thin liquid bolus, suspect premature spillage given pooling in the valleculae with pureed contrast residue. Pt triggered a delayed swallow, which was again insufficient for clearance of residue. No further trials as pt is at severe risk for aspiration at this time. Recommend NPO with temporary alternative means of nutrition. SLP will follow for pharyngeal  strengthening, RMT as pt's cognition improves.    Swallow Evaluation Recommendations       SLP Diet Recommendations: NPO;Alternative means - temporary                       Oral Care Recommendations: Oral care QID   Other Recommendations: Have oral suction available;Remove water pitcher  Jay BatonMary Beth Kowen Kluth, MS, CCC-SLP Speech-Language Pathologist [phone]  Jay Stephens 01/29/2017,11:35 AM

## 2017-01-29 NOTE — Progress Notes (Signed)
Hypoglycemic Event  CBG: 58  Treatment: D50 IV 50 mL  Symptoms: None  Follow-up CBG: Time:1223 CBG Result:132  Possible Reasons for Event: Inadequate meal intake  Comments/MD notified: Dr. Mosetta PuttFeng notified who ordered 1 amp of D50    Jay Stephens, Jay Stephens

## 2017-01-29 NOTE — Progress Notes (Signed)
Rechecked patient. Pox 98% on 1.5L O2. Lungs coarse but less rhonchi than prior. No respiratory distress. BNP was 1560. He received Lasix 40mg  IV once. RN reports he has already put out 1L UOP. Mitts are on his hands to prevent him pulling out IV's. He appears calm yet confused. Will plan to repeat Lasix again in the AM.
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
## 2017-01-29 NOTE — Progress Notes (Signed)
Pharmacy Antibiotic Note  Jay Stephens is a 71 y.o. male admitted on 02/04/2017 with pneumonia. Pharmacy was consulted for vancomycin and zosyn dosing. [facility] then quickly readmitted when he desaturated while eating. Afebrile today, PCT was trending down (last measured 8/8), WBC 5.0 today, also trending down. Chest CT on 8/7 showed no improvement and the chest tube is not adequately draining the fluid. Cultures NGTD. Old: body fluid culture on 07/24 showed S. Viridans sensitive to FQ's and vancomycin (no others reported).  Vancomycin was discontinued on 8/8.  Plan: Continue Zosyn 3.375 grams IV Q8h Monitor Cultures, LOT, clinical status  Height: 5\' 9"  (175.3 cm) Weight: 132 lb 8 oz (60.1 kg) IBW/kg (Calculated) : 70.7  Temp (24hrs), Avg:97.9 F (36.6 C), Min:97.5 F (36.4 C), Max:98.8 F (37.1 C)   Recent Labs Lab 01/25/17 0523 01/25/17 0616 01/25/17 0856 01/26/17 0206 01/26/17 0930 01/27/17 0301 01/28/17 0307 01/29/17 0355 01/29/17 1055  WBC  --  7.7  --   --   --  4.3 4.7 4.1 5.0  CREATININE 1.16  --   --  1.14  --  1.17 1.28* 1.37*  --   VANCOTROUGH  --   --  27*  --  15  --   --   --   --     Estimated Creatinine Clearance: 43.3 mL/min (A) (by C-G formula based on SCr of 1.37 mg/dL (H)).    No Known Allergies  Antimicrobials this admission: Zosyn 08/03>> Vancomycin 08/03 >>8/8 Microbiology results: Urine cx 08/07>> NGTD Blood cx 08/07>> NGTD Resp cx 08/07>> NGTD Urine cx 08/03>> no growth; final Blood cx 08/03>> no growth x 3 days MRSA PCR >> negative  Old: body fluid culture on 07/24 showed S. Viridans sensitive to FQ's and vancomycin (no others reported)  Adline PotterSabrina Toniesha Zellner PharmD PGY1 Pharmacy Practice Resident Pager: [phone]

## 2017-01-30 LAB — BPAM RBC
BLOOD PRODUCT EXPIRATION DATE: 201809072359
ISSUE DATE / TIME: 201808111831
UNIT TYPE AND RH: 5100

## 2017-01-30 LAB — GLUCOSE, CAPILLARY
GLUCOSE-CAPILLARY: 112 mg/dL — AB (ref 65–99)
GLUCOSE-CAPILLARY: 112 mg/dL — AB (ref 65–99)
GLUCOSE-CAPILLARY: 114 mg/dL — AB (ref 65–99)
GLUCOSE-CAPILLARY: 126 mg/dL — AB (ref 65–99)
GLUCOSE-CAPILLARY: 96 mg/dL (ref 65–99)
Glucose-Capillary: 91 mg/dL (ref 65–99)

## 2017-01-30 LAB — TYPE AND SCREEN
ABO/RH(D): O POS
Antibody Screen: NEGATIVE
UNIT DIVISION: 0

## 2017-01-30 LAB — CBC
HEMATOCRIT: 27.7 % — AB (ref 39.0–52.0)
Hemoglobin: 8.6 g/dL — ABNORMAL LOW (ref 13.0–17.0)
MCH: 26.9 pg (ref 26.0–34.0)
MCHC: 31 g/dL (ref 30.0–36.0)
MCV: 86.6 fL (ref 78.0–100.0)
PLATELETS: 249 10*3/uL (ref 150–400)
RBC: 3.2 MIL/uL — ABNORMAL LOW (ref 4.22–5.81)
RDW: 16 % — AB (ref 11.5–15.5)
WBC: 6 10*3/uL (ref 4.0–10.5)

## 2017-01-30 LAB — BASIC METABOLIC PANEL
ANION GAP: 10 (ref 5–15)
BUN: 19 mg/dL (ref 6–20)
CALCIUM: 8 mg/dL — AB (ref 8.9–10.3)
CO2: 31 mmol/L (ref 22–32)
CREATININE: 1.41 mg/dL — AB (ref 0.61–1.24)
Chloride: 110 mmol/L (ref 101–111)
GFR, EST AFRICAN AMERICAN: 57 mL/min — AB (ref >60)
GFR, EST NON AFRICAN AMERICAN: 49 mL/min — AB (ref >60)
Glucose, Bld: 110 mg/dL — ABNORMAL HIGH (ref 65–99)
Potassium: 3 mmol/L — ABNORMAL LOW (ref 3.5–5.1)
SODIUM: 151 mmol/L — AB (ref 135–145)

## 2017-01-30 LAB — TROPONIN I
TROPONIN I: 3.9 ng/mL — AB (ref <0.03)
Troponin I: 3.69 ng/mL (ref <0.03)
Troponin I: 2.92 ng/mL (ref <0.03)

## 2017-01-30 MED ORDER — POTASSIUM CHLORIDE 10 MEQ/100ML IV SOLN
10.0000 meq | INTRAVENOUS | Status: AC
  Administered 2017-01-30 (×5): 10 meq via INTRAVENOUS
  Filled 2017-01-30 (×5): qty 100

## 2017-01-30 NOTE — Progress Notes (Signed)
Family Medicine Teaching Service Daily Progress Note Intern Pager: [phone]  Patient name: Jay HibbsJessie Stephens Medical record number: [id number] Date of birth: 09/04/1945 Age: 71 y.o. Gender: male  Primary Care Provider: System, Pcp Not In Consultants: CCM, Cardio, palliative Code Status: full  Pt Overview and Major Events to Date:  Jay CoasterJessie Bestis a 71 y.o.malewith PMH is significant for right sided empyema s/p drainage via chest tube, altered mental status, R facial droop, tooth abscess s/p extraction and I&D, protein-calorie malnutrition, electrolyte abnormalities and generalized weakness admitted with acute resp failure from Strep pneumonia and re accumulation of R empyema. Intubated on 8/5 - 8/9. Rapid response 8/10 for resp. distress w/CCM and Cards called, now improved.  Pulmonary:  Dyspnea secondary to strep pneumonia w/ R empyema: Not septic anymore. Dyspnea improved, on 2 L [place] with O2 in high 90's-100% per documentation. On room air on exam today. Afebrile and no leukocytosis. Blood cx drawn on 8/10 NGTD.  - CCM following, appreciate their assistance -patient has self-extubated twice during prior ICU stay -O2 as needed for O2 <92% -continue zosyn -continuous pulse ox -daily CBC, BMP -hold fluids for now -NPO- risk for aspiration -vitals per unit routine - If dyspnea worsens, get CXR and consider IV lasix if signs of fluid overload  Cardiac:  Elevated troponin/ST changes:: Troponin 2.23>1.91> 1.78>3.01 on 8/11. ECG showed ST changes. Evaluated by cardiology, determined to be likely due to hypoxia from respiratory issues and anemia. Troponin 3.9 this morning. Denies chest pain or shortness of breath -call cardiology if new symptoms develop.  - Repeat EKG   HFpEF: 01/12/17 Echo EF 55% to 60% with G2DD. Has 1+ pitting edema in ankles. 2 L UO over last 24 hours. Wt 128 lbs today, was 132 3 days ago.  - Will hold off on Lasix for now - Continue strict I/Os and daily  weights  GI Aspiration risk and NPO: Patient went for modified barium swallow and recommendations are nothing by mouth secondary to severe aspiration risk -NPO  -Will consult Cortrak team again, they are not here today, will attempt tomorrow hopefully - D10 infusion @ 10-20 cc/hr  Protein-calorie malnutrition: Patient is cachectic, which is likely due to a chronic illness such as cancer as well as his low function at baseline. Nephew reported earlier that he has to be fed at home. Albumin on admission is 1.4. Seen by nutrition and palliative care during his recent hospitalization. Is NPO for now as he failed swallow evaluation. Cortrak attempt was placed on 8/11, failed.  -monitor Mag, Phos, K daily  -continue to follow nutrition and palliative care recs - D10 as above  Renal Electrolyte abnormalities: K of 3.0 this morning. Na 151. Likely from dehydration vs not feeding - 5 rounds of 10 meq of KCl IV  AKI: Cr 1.41. Likely pre renal etiology - Daily BMP - Attempt to avoid nephrotoxic agents  Foley catheter in Place  Infectious:  Tooth abscess s/p extraction and I&D:Patient denied pain in mouth. No obvious bleeding.Since the extraction occurred so recently (on 8/1), will need to be monitored for any infection or bleeding. -continue to monitor daily -chlorhexadine mouthwash TID until 8/15  Endocrine Hypoglycemia: Glucose continues to dip occasionally into the 50's. Not being fed. He has very little reserve. Glucose 110 this AM - Continue D10 @ 10cc/hr  Social/Disposition Goals of Care: Family wants full code for now. Patient only oriented to self today.  - Palliative consulted, appreciate their assistance  Hematological Anemia: Received 1 unit p RBCs  for hgb to 7.5. Hgb A1C 8.6 after transfusion. Transfusion threshold <8 - Daily CBC  Neurological AMS with R-sided facial droop and weakness:Patient is alert but only oriented to person, seems to be. at baseline for this  visit based off previous notes - Continue to monitor   Pain control: -tylenol prn, can do rectal if needed  Fall risk: patient was found on the floor by his bed x 2 since being transferred out of ICU. -sitter ordered but nursing informed us that no sitters available, tele-sitter ordered  Dermatologic:  Sacral ulcer: -foam/turning Q2 per nursing protocol   FEN/GI:NPO Prophylaxis:Lovenox  Disposition:? Likely back to SNF when medically stable but patient has many medical issues that need to be addressed  Subjective:  Patient is alert and lying in bed this morning. He states that he has no pain, shortness of breath, edema. He is only oriented to self. He is asking for water and states that he is hungry.  To patient that he is getting some fluids through his IV but he did not seem to understand this. Asked patient if he was okay with having a tube placed on his throat to help him eat, he responded "I don't think I'd want that".   Objective: Temp:  [97.3 F (36.3 C)-98.9 F (37.2 C)] 97.3 F (36.3 C) (08/12 0454) Pulse Rate:  [77-96] 88 (08/12 0454) Resp:  [6-27] 27 (08/12 0454) BP: (109-143)/(74-94) 143/94 (08/12 0454) SpO2:  [99 %-100 %] 100 % (08/12 0454) Weight:  [128 lb (58.1 kg)] 128 lb (58.1 kg) (08/12 0454) Physical Exam: General: frail, laying in bed, in NAD Eyes: EOMI, right eye somewhat laterally deviates ENTM: dry mucous membranes, dry lips Cardiovascular: RRR, no murmur, no JVD, 1+ pitting edema in bilateral ankles Respiratory: On room air. Normal work of breathing, decreasing lung sounds on right side anteriorly compared to left. Left lung sounds clear.  Gastrointestinal: +bowel sounds, nontender/soft belly MSK: thin extremities Neuro: alert and oriented to self only   Laboratory:  Recent Labs Lab 01/29/17 0355 01/29/17 1055 01/30/17 0608  WBC 4.1 5.0 6.0  HGB 7.7* 7.5* 8.6*  HCT 25.3* 25.2* 27.7*  PLT 246 253 249    Recent Labs Lab  01/28/17 0307 01/29/17 0355 01/30/17 0608  NA 147* 150* 151*  K 4.0 3.8 3.0*  CL 109 111 110  CO2 29 25 31   BUN 25* 27* 19  CREATININE 1.28* 1.37* 1.41*  CALCIUM 7.7* 7.9* 8.0*  GLUCOSE 141* 110* 110*    Tropnins 2.23, 1.91, 1.78  Imaging/Diagnostic Tests: Ct Chest W Contrast  Result Date: 01/22/2017 CLINICAL DATA:  Status post chest tube for right empyema. EXAM: CT CHEST WITH CONTRAST TECHNIQUE: Multidetector CT imaging of the chest was performed during intravenous contrast administration. CONTRAST:  75mL ISOVUE-300 IOPAMIDOL (ISOVUE-300) INJECTION 61% COMPARISON:  Chest CT dated 01/16/2017. Chest x-ray from earlier same day. FINDINGS: Cardiovascular: Heart size is normal. No pericardial effusion. Coronary artery calcifications. No thoracic aortic aneurysm or dissection. Mediastinum/Nodes: Scattered small and moderate-sized lymph nodes within the mediastinum and perihilar regions, likely reactive in nature. Trachea and central bronchi are unremarkable. Lungs/Pleura: Large complex collections are seen throughout the right chest, major components probably contiguous but also likely multiloculated to some degree. Dominant collection of fluid and air along the posterior aspects of the right upper lobe and right lower lobe measures over 23 cm craniocaudal dimension (series 7, image 66) and approximately 12 x 7 cm transverse by AP dimensions near the right lung base (series 7,  image 61; series 3, image 126). The posterior collection appears contiguous with an additional large complex collection of fluid and air at the right lung apex (probable connection seen on series 3, image 52). The complex collection in the right lung apex measures 14 cm craniocaudal dimension and 5 cm thickness (series 7, image 54; series 3, image 63). Lastly, additional complex fluid collection is seen along the right lateral chest wall measuring 9 cm AP dimension and 2.5 cm thickness (series 3, image 80). This collection does  appear contiguous with the collection at the right lung apex. Moderate-size layering pleural effusion on the left with adjacent compressive atelectasis. Upper Abdomen: Free fluid/ascites within the upper abdomen, at least a moderate amount, incompletely imaged. Musculoskeletal: No acute or suspicious osseous finding. Right-sided chest tube seen on CT of 01/16/2017 has been removed. IMPRESSION: 1. Large complex collections of fluid and air throughout the right chest, locations and measurements given above, largest collection along the posterior right chest measuring over 20 cm greatest dimension, presumed empyemas. These findings represent a significant worsening compared to the earlier chest CT of 01/16/2017. These collections appear to be contiguous but are also likely multiloculated to some degree. Right-sided chest tube has been removed in the interval. 2. Moderate-sized left pleural effusion. 3. Mediastinal and perihilar lymphadenopathy is likely reactive in nature. 4. Upper abdominal ascites, incompletely imaged. These results were called by telephone at the time of interpretation on 01/22/2017 at 11:00 am to Dr. Acquanetta Belling , who verbally acknowledged these results. Electronically Signed   By: Bary Richard M.D.   On: 01/22/2017 11:17   Dg Chest Port 1 View  Result Date: 01/22/2017 CLINICAL DATA:  Empyema.  Status post chest tube placement. EXAM: PORTABLE CHEST 1 VIEW COMPARISON:  CT chest 0 8/0 scratch the CT chest earlier today. Single-view of the chest 01/27/2017. FINDINGS: A new right chest tube is in place. Loculated pleural fluid collections in the right chest do not appear changed. Airspace disease in the right lung is also not notably changed. The left lung is expanded and clear. Left pleural effusion is noted. IMPRESSION: No marked change in loculated right pleural effusions and airspace disease with a new chest tube in place. Negative for pneumothorax. Small left pleural effusion seen on prior CT  scan is not well demonstrated on this exam. Electronically Signed   By: Drusilla Kanner M.D.   On: 01/22/2017 15:51   Dg Chest Portable 1 View  Result Date: 02/08/2017 CLINICAL DATA:  Shortness of breath.  History of empyema. EXAM: PORTABLE CHEST 1 VIEW COMPARISON:  PA and lateral chest 13-Feb-2017 and 01/18/2017. CT chest 01/16/2017. FINDINGS: Pleural effusion which appears loculated along the right chest wall is again seen. Right basilar effusion is new since the most recent examination. There is new airspace disease throughout the right chest. The left lung is clear. Heart size is normal. IMPRESSION: New airspace disease throughout the right chest with a new right basilar effusion worrisome for pneumonia. Loculated right pleural effusion does not appear notably changed since the most recent exam. Electronically Signed   By: Drusilla Kanner M.D.   On: 01/29/2017 20:03     Beaulah Dinning, MD 01/30/2017, 8:34 AM PGY-3, [facility] FPTS Intern pager: [phone], text pages welcome

## 2017-01-30 NOTE — Progress Notes (Signed)
Report received at the bedside via Josh RN using SBAR format, reviewed VS, BS and some general information about patient, assumed care of patient.

## 2017-01-30 NOTE — Progress Notes (Signed)
Daily Progress Note   Patient Name: Jay Stephens       Date: 01/30/2017 DOB: 1945/11/19  Age: 71 y.o. MRN#: [id number] Attending Physician: Leeanne Rio, MD Primary Care Physician: System, Pcp Not In Admit Date: 02/03/2017  Reason for Consultation/Follow-up: Establishing goals of care and Psychosocial/spiritual support  Subjective: Met with patient's uncle and his wife again today. Updated family that Cortrak was not placed, and likely would not be placed until 01/31/2017 secondary to weekend staffing not being available. Patient remains nothing by mouth. Again, family recognizes how sick he is, the potential that this could mean end-of-life but patient's uncle desires more information regarding potential treatment options, and more definitive information on his empyema/loculated pleural effusions before he feels that he can move forward with any firm decisions. He does understand what a full code versus a DO NOT RESUSCITATE means. He understands that his uncle is a full code presently and desires that he remains so until further information can be gleaned as to his overall clinical condition  Length of Stay: 9  Current Medications: Scheduled Meds:  . chlorhexidine gluconate (MEDLINE KIT)  15 mL Mouth Rinse BID  . heparin subcutaneous  5,000 Units Subcutaneous Q8H  . insulin aspart  2-6 Units Subcutaneous Q4H  . ipratropium-albuterol  3 mL Nebulization BID  . mouth rinse  15 mL Mouth Rinse QID    Continuous Infusions: . sodium chloride    . dextrose 10 mL/hr at 01/29/17 2204  . piperacillin-tazobactam (ZOSYN)  IV Stopped (01/30/17 0904)  . potassium chloride 10 mEq (01/30/17 0900)    PRN Meds: acetaminophen, albuterol, RESOURCE THICKENUP CLEAR  Physical Exam  Constitutional:    Frail cachectic older man; in no acute distress. Pleasantly confused  HENT:  Head: Normocephalic and atraumatic.  Neck: Normal range of motion.  Cardiovascular: Normal rate.   Pulmonary/Chest: Effort normal.  Abdominal: Soft.  Musculoskeletal: Normal range of motion.  Neurological: He is alert.  Oriented to himself Recognizes his family Doesn't understand his clinical condition  Skin: Skin is warm and dry.  Psychiatric: He has a normal mood and affect.  Patient is confused; having difficulty with hospital procedures such as he pulled out his IV today because it burned  Nursing note and vitals reviewed.           Vital Signs:  BP (!) 142/85   Pulse 83   Temp 97.8 F (36.6 C) (Oral)   Resp (!) 24   Ht _0  (1.753 m)   Wt 58.1 kg (128 lb)   SpO2 99%   BMI 18.90 kg/m  SpO2: SpO2: 99 % O2 Device: O2 Device: Nasal Cannula O2 Flow Rate: O2 Flow Rate (L/min): 2 L/min  Intake/output summary:  Intake/Output Summary (Last 24 hours) at 01/30/17 1158 Last data filed at 01/30/17 0600  Gross per 24 hour  Intake              503 ml  Output             2175 ml  Net            -1672 ml   LBM: Last BM Date: 01/29/17 Baseline Weight: Weight: 64 kg (141 lb 1.5 oz) Most recent weight: Weight: 58.1 kg (128 lb)       Palliative Assessment/Data:    Flowsheet Rows     Most Recent Value  Intake Tab  Referral Department  Hospitalist  Unit at Time of Referral  Med/Surg Unit  Palliative Care Primary Diagnosis  Sepsis/Infectious Disease  Date Notified  01/27/17  Palliative Care Type  Return patient Palliative Care  Reason for referral  Clarify Goals of Care, Psychosocial or Spiritual support  Date of Admission  02/15/2017  Date first seen by Palliative Care  01/28/17  # of days Palliative referral response time  1 Day(s)  # of days IP prior to Palliative referral  6  Clinical Assessment  Palliative Performance Scale Score  30%  Pain Max last 24 hours  Not able to report  Pain Min  Last 24 hours  Not able to report  Dyspnea Max Last 24 Hours  Not able to report  Dyspnea Min Last 24 hours  Not able to report  Nausea Max Last 24 Hours  Not able to report  Nausea Min Last 24 Hours  Not able to report  Anxiety Max Last 24 Hours  Not able to report  Anxiety Min Last 24 Hours  Not able to report  Other Max Last 24 Hours  Not able to report  Psychosocial & Spiritual Assessment  Palliative Care Outcomes  Patient/Family meeting held?  No  Who was at the meeting?  scheduled for 11am 8/11  Palliative Care Outcomes  Provided psychosocial or spiritual support  Palliative Care follow-up planned  Yes, Facility      Patient Active Problem List   Diagnosis Date Noted  . Palliative care by specialist   . Obtundation 01/22/2017  . Aspiration pneumonia ([place]) 01/22/2017  . Ascites 01/22/2017  . Emphysema lung ([place]) 01/22/2017  . Aortic atherosclerosis ([place]) 01/22/2017  . Echocardiogram shows left ventricular diastolic dysfunction, Grade Two 01/22/2017  . Respiratory distress 01/23/2017  . Pressure ulcer of sacrum 02/05/2017  . Pleural effusion on left   . Pneumothorax on right   . Palliative care encounter   . Protein-calorie malnutrition, severe ([place]) 01/11/2017  . Acute respiratory failure with hypoxia ([place])   . Empyema ([place])   . 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 Altered mental status 12/29/2016    Palliative Care Assessment & Plan   Patient Profile:  71 y.o.malewith PMH is significant for right sided empyema s/p drainage via chest tube, altered mental status, R facial droop, tooth abscess s/p extraction and I&D, protein-calorie malnutrition, electrolyte abnormalities and generalized weakness admitted with acute resp failure from Strep pneumonia and  re accumulation of R empyema. Intubated on 8/5 - 8/9. Rapid response 8/10 for resp. distress w/CCM and Cards called  Pt seen by cardiology 01/30/17 secondary elevated enzymes: NSTEMI vs demand ischemia. At this  point felt to be too fragile for additional cardiac interventions. Blood cx drawn 8/10 NGTD   Recommendations/Plan:  Recommend multidisciplinary mtg with attending and PMT provider to address pt's debility in setting of acute illness and what to expect going forward, as well as current treatment plan. Pt's nephew, Chauncey Reading, recognizes how frail his Barbaraann Rondo is and verbalized he would not be surprised if he were nearing EOL,   Goals of Care and Additional Recommendations:  Limitations on Scope of Treatment: Full Scope Treatment  Code Status:    Code Status Orders        Start     Ordered   01/22/17 0203  Full code  Continuous     01/22/17 0202    Code Status History    Date Active Date Inactive Code Status Order ID Comments User Context   01/07/2017  9:48 PM 01/28/2017  7:19 PM Full Code [id number]  Carlyle Dolly, MD Inpatient       Prognosis:   Unable to determine  Discharge Planning:  To Be Determined  Care plan was discussed with Dr. Ardelia Mems  Thank you for allowing the Palliative Medicine Team to assist in the care of this patient.   Time In: 1100 Time Out: 1120 Total Time 20 min Prolonged Time Billed  no       Greater than 50%  of this time was spent counseling and coordinating care related to the above assessment and plan.  Dory Horn, NP  Please contact Palliative Medicine Team phone at [phone] for questions and concerns.

## 2017-01-30 NOTE — Progress Notes (Deleted)
Spoke with Dr Laneta SimmersBartle re: routine tests for CABG not ordered or performed, CXR was done 8/1, CTA 8/3, no PFT's or vein mapping.  Doctor stated that those tests didn't need to be ordered or completed due to emergent nature of case.

## 2017-01-30 NOTE — Progress Notes (Signed)
Family Practice called re: BS of 1155, patient given an amp of D50 and will continue to monitor. Pt was unable to have a Cortrak placed at this time, will try again tomorrow, made MD aware of that and that pt is NPO D/T aspiration. Josh gave 1/2 Amp of D50 earlier in his shift and his BS came up to 90, may need some D5-10 at a lower rate to help keep BS within better range. MD came up to see fairly quickly after I called her and also IV team to start a new line since his inner forearm looked a little suspicious of an infiltrate after D50 administered, will continue to monitor.

## 2017-01-30 NOTE — Progress Notes (Signed)
PULMONARY / CRITICAL CARE MEDICINE   Name: Jay Stephens MRN: [id number] DOB: 11/20/1945    ADMISSION DATE:  01/28/2017 CONSULTATION DATE:  01/22/2013  REFERRING MD:  Dr. Roxan Hockey / CVTS  CHIEF COMPLAINT:  Complicated Right Pleural Space  HISTORY OF PRESENT ILLNESS:  71 y.o. debilitated man w hx malnutrition, HTN and diastolic dysfxn, recent aspiration PNA c/b S viridans R empyema. This was drained by chest tube with improvement but some residual fluid pockets of fluid by CT chest 01/16/17. He was discharged to SNF on 8/3 but returned later the same day with encephalopathy, dyspnea, hypoxemia. Treated with broad spectrum Abx and transiently required BiPAP. Repeat CT scan 8/4 shows multiple large fluid collections largest posterior R chest, other possibly contiguous collections. TCTS and PCCM consulted to help w management.   SUBJECTIVE:   Awake in bed, f/c. Off o2 , sats 97% on room air,. No dyspnea.   VITAL SIGNS: BP (!) 142/85   Pulse 83   Temp 97.8 F (36.6 C) (Oral)   Resp (!) 24   Ht _0  (1.753 m)   Wt 128 lb (58.1 kg)   SpO2 99%   BMI 18.90 kg/m   HEMODYNAMICS:    VENTILATOR SETTINGS:    INTAKE / OUTPUT: I/O last 3 completed shifts: In: 503 [I.V.:130; Blood:323; IV Piggyback:50] Out: 3254 [DIYME:1583]  PHYSICAL EXAMINATION: General: chronically ill appearing male in NAD, lying in bed / low position of bed with pads on floor   HEENT: MM pink/moist, no jvd PSY: calm Neuro: Awake, alert, oriented to self, speech clear  CV: s1s2 rrr, no m/r/g PULM: even/non-labored, diminished bs in bases  EN:MMHW, NT , BS +  Extremities: warm/dry, no edema  Skin: no rashes or lesions   LABS:  BMET  Recent Labs Lab 01/28/17 0307 01/29/17 0355 01/30/17 0608  NA 147* 150* 151*  K 4.0 3.8 3.0*  CL 109 111 110  CO2 _1 BUN 25* 27* 19  CREATININE 1.28* 1.37* 1.41*  GLUCOSE 141* 110* 110*    Electrolytes  Recent Labs Lab 01/26/17 0206 01/27/17 0301  01/28/17 0307 01/29/17 0355 01/30/17 0608  CALCIUM 7.4* 7.6* 7.7* 7.9* 8.0*  MG 1.8 2.0 2.0  --   --   PHOS 2.7 3.6 3.8  --   --     CBC  Recent Labs Lab 01/29/17 0355 01/29/17 1055 01/30/17 0608  WBC 4.1 5.0 6.0  HGB 7.7* 7.5* 8.6*  HCT 25.3* 25.2* 27.7*  PLT 246 253 249    Coag's  Recent Labs Lab 01/24/17 0544  INR 1.06    Sepsis Markers  Recent Labs Lab 01/24/17 0858 01/25/17 0523 01/26/17 0206  PROCALCITON 2.66 1.92 1.23    ABG  Recent Labs Lab 01/28/17 2110  PHART 7.424  PCO2ART 34.9  PO2ART 59.3*    Liver Enzymes  Recent Labs Lab 01/26/17 0206 01/27/17 0301 01/28/17 0307  ALBUMIN 1.3* 1.4* 1.4*    Cardiac Enzymes  Recent Labs Lab 01/29/17 0355 01/29/17 1055 01/30/17 0900  TROPONINI 1.78* 3.01* 3.90*    Glucose  Recent Labs Lab 01/29/17 1956 01/29/17 2111 01/30/17 0018 01/30/17 0432 01/30/17 0720 01/30/17 1117  GLUCAP 55* 142* 112* 114* 96 112*    Imaging No results found.   STUDIES:  CT CHEST W/ CONTRAST 7/29: IMPRESSION: 1. Significantly improved right-sided empyema. Small pockets of residual fluid are seen along the lateral chest wall and fissures. Pneumothorax is small and stable from chest x-ray earlier today. 2. Pneumonia and atelectasis  on the right, greatest in the lower lobe. 3. Diminished concern for obstructing airway lesion when compared to admission CT, when there was bronchial distortion due to the pleural effusion. Recommend three-month follow-up CT in this smoker with nonspecific adenopathy. 4. Small upper abdominal ascites that is newly seen. 5. Aortic Atherosclerosis (ICD10-I70.0) and Emphysema (ICD10-J43.9). 6. 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 PORT CXR 7/30:  Minimal residual right pleural effusion. Minimal residual right pneumothorax. Right-sided chest tube directed caudally. PORT CXR 7/31:  Small right apical pneumothorax. No appreciable change compared  with chest x-ray imaging from last evening. No new focal opacity appreciated. No clear evolution of or development of right pleural effusion. CT CHEST W/ CONTRAST 8/4: IMPRESSION: 1. Large complex collections of fluid and air throughout the right chest, locations and measurements given above, largest collection along the posterior right chest measuring over 20 cm greatest dimension, presumed empyemas. These findings represent a significant worsening compared to the earlier chest CT of 01/16/2017. These collections appear to be contiguous but are also likely multiloculated to some degree. Right-sided chest tube has been removed in the interval. 2. Moderate-sized left pleural effusion. 3. Mediastinal and perihilar lymphadenopathy is likely reactive in nature. 4. Upper abdominal ascites, incompletely imaged. PORT CXR 8/5:   Right-sided chest tube in good position. Endotracheal tube in good position. Enteric feeding tube coursing below diaphragm. Right lung opacities persistent. CT CHEST W/O 8/7: IMPRESSION: 1. Multifactorial degradation, as detailed above. 2. Similar appearance and configuration of multiple loculated right-sided pleural fluid collections, most consistent with empyemas. Small foci of pleural gas are not significantly changed.  3. Placement of a right-sided large-bore chest tube since the prior CT. The chest tube is surrounded by pulmonary parenchyma, and is likely not sufficiently draining the pleural space. 4.  Emphysema (ICD10-J43.9). 5. Coronary artery atherosclerosis. Aortic Atherosclerosis (ICD10-I70.0). 6. Similar small left pleural effusion. 7. Upper abdominal ascites. PORT CXR 8/8:  Enteric feeding tube coursing below diaphragm. Endotracheal tube in good position. Right chest tube in good position. Slight improvement in right lung patchy opacification with residual pleural effusion still present. PORT CXR 8/8:  Endotracheal and enteric feeding tubes removed. Right chest tube  removed. Lordotic view with slight rotation to the right. Increased opacity right lung with residual effusion.   MICROBIOLOGY: MRSA PCR 7/24:  Negative Blood Cultures x2 7/24:  Negative  Right BAL 7/24:  Multiple Organisms Right Pleural Fluid culture 7/24:  Viridans streptococci  MRSA PCR 8/1:  Negative Blood Cultures x2 8/3 >>> Urine Culture 8/3:  Negative  MRSA PCR 8/4:  Negative  Tracheal Aspirate Culture 8/6:  Negative  Urine Culture 8/6:  Negative  Blood Cultures x2 8/6 >>>  ANTIBIOTICS: Vancomycin 7/24 - 7/25 Zosyn 7/24 - 7/28 Ancef 7/28 - 8/3 Vancomycin 8/3  - 8/8  Zosyn 8/3 >>>  SIGNIFICANT EVENTS: 7/23 - admit to [facility] 7/24 - rapid response, intubated, bronchoscopy, R chest tube placed with ~3L milky yellow pus drained 7/29 - chest tube to water seal w/ small residual pneumothorax 7/30 - chest tube removed w/ small residual right pneumothorax 8/03 - discharged & returned promptly  8/05 - self-extubated & was reintubated w/ 7.5 ETT from 8.0 ETT 8/06 - febrile >> recultured  8/08 - self-extubated despite restraints. Chest tube removed.  8/10 - on floor, no distress  LINES/TUBES: OETT 7.5 8/5 - 8/8 (self extubated twice now) OGT 8/5 - 8/8 R CHEST TUBE 8/5 - 8/8 PIV  ASSESSMENT / PLAN:  PULMONARY A:  Acute Hypoxic Respiratory Failure:  Self-extubated x2 - last 8/9  Hemoptysis - occurred with intrapleural lytic therapy.  Right Empyema - S/P removal of chest tube 8/8. Apnea - No respiratory effort. Suspect secondary to Fentanyl drip.   P:   Follow intermittent CXR-check in am  Monitor saturations Pulmonary hygiene - IS, mobilize CT surgery following, appreciate input  INFECTIOUS A:   Streptococcus Pneumonia w/ Empyema  Sepsis  P:   Follow cultures above to maturity  D9/x Zosyn   FAMILY  - Updates: No family at bedside      Tammy Parrett NP-C  [facility]  [phone]   01/30/2017, 12:27 PM

## 2017-01-30 NOTE — Progress Notes (Signed)
Subjective:  No complaints of chest pain.  He is lying in bed but does not really answer questions.  Objective:  Vital Signs in the last 24 hours: BP (!) 142/85   Pulse 83   Temp 97.8 F (36.6 C) (Oral)   Resp (!) 24   Ht 5\' 9"  (1.753 m)   Wt 58.1 kg (128 lb)   SpO2 99%   BMI 18.90 kg/m   Physical Exam: Cachectic appearing black male lying in bed Lungs:  Reduced breath sounds  Cardiac:  Regular rhythm, normal S1 and S2, no S3 Abdomen:  Soft, nontender, no masses Extremities:  No edema present  Intake/Output from previous day: 08/11 0701 - 08/12 0700 In: 503 [I.V.:130; Blood:323; IV Piggyback:50] Out: 2175 [Urine:2175]  Weight Filed Weights   01/27/17 0248 01/27/17 1609 01/30/17 0454  Weight: 60.8 kg (134 lb 0.6 oz) 60.1 kg (132 lb 8 oz) 58.1 kg (128 lb)    Lab Results: Basic Metabolic Panel:  Recent Labs  40/98/1107/05/09 0355 01/30/17 0608  NA 150* 151*  K 3.8 3.0*  CL 111 110  CO2 25 31  GLUCOSE 110* 110*  BUN 27* 19  CREATININE 1.37* 1.41*   CBC: Cardiac Panel (last 3 results)  Recent Labs  01/29/17 0355 01/29/17 1055 01/30/17 0900  TROPONINI 1.78* 3.01* 3.90*    Telemetry: Currently sinus rhythm personally reviewed  Assessment/Plan:  1.  Enzymes are consistent with a non-STEMI versus demand ischemia precipitated by hypoxemia and respiratory distress 2.  Prior empyema 3.  Hypertensive heart disease 4.  Dementia and encephalopathy  Recommendations:  Clearly the present time is not in any shape for any additional cardiovascular workup.  Continue to treat medically and correct electrolyte disturbances.      Darden PalmerW. Spencer Zillah Alexie, Jr.  MD [facility]outhFACC Cardiology  01/30/2017, 11:37 AM

## 2017-01-31 ENCOUNTER — Inpatient Hospital Stay (HOSPITAL_COMMUNITY): Payer: Medicare Other

## 2017-01-31 DIAGNOSIS — Z515 Encounter for palliative care: Secondary | ICD-10-CM

## 2017-01-31 LAB — BASIC METABOLIC PANEL
ANION GAP: 15 (ref 5–15)
BUN: 21 mg/dL — ABNORMAL HIGH (ref 6–20)
CALCIUM: 8.1 mg/dL — AB (ref 8.9–10.3)
CO2: 26 mmol/L (ref 22–32)
CREATININE: 1.53 mg/dL — AB (ref 0.61–1.24)
Chloride: 110 mmol/L (ref 101–111)
GFR, EST AFRICAN AMERICAN: 52 mL/min — AB (ref >60)
GFR, EST NON AFRICAN AMERICAN: 45 mL/min — AB (ref >60)
Glucose, Bld: 144 mg/dL — ABNORMAL HIGH (ref 65–99)
Potassium: 3.8 mmol/L (ref 3.5–5.1)
Sodium: 151 mmol/L — ABNORMAL HIGH (ref 135–145)

## 2017-01-31 LAB — TROPONIN I: TROPONIN I: 3.45 ng/mL — AB (ref <0.03)

## 2017-01-31 LAB — CBC
HCT: 28 % — ABNORMAL LOW (ref 39.0–52.0)
HEMOGLOBIN: 8.7 g/dL — AB (ref 13.0–17.0)
MCH: 27.8 pg (ref 26.0–34.0)
MCHC: 31.1 g/dL (ref 30.0–36.0)
MCV: 89.5 fL (ref 78.0–100.0)
PLATELETS: 219 10*3/uL (ref 150–400)
RBC: 3.13 MIL/uL — AB (ref 4.22–5.81)
RDW: 16.9 % — ABNORMAL HIGH (ref 11.5–15.5)
WBC: 6.3 10*3/uL (ref 4.0–10.5)

## 2017-01-31 LAB — GLUCOSE, CAPILLARY
GLUCOSE-CAPILLARY: 104 mg/dL — AB (ref 65–99)
GLUCOSE-CAPILLARY: 105 mg/dL — AB (ref 65–99)
GLUCOSE-CAPILLARY: 128 mg/dL — AB (ref 65–99)
GLUCOSE-CAPILLARY: 130 mg/dL — AB (ref 65–99)
GLUCOSE-CAPILLARY: 132 mg/dL — AB (ref 65–99)
GLUCOSE-CAPILLARY: 53 mg/dL — AB (ref 65–99)
Glucose-Capillary: 125 mg/dL — ABNORMAL HIGH (ref 65–99)

## 2017-01-31 MED ORDER — JEVITY 1.2 CAL PO LIQD
1000.0000 mL | ORAL | Status: DC
  Administered 2017-01-31: 1000 mL
  Filled 2017-01-31 (×9): qty 1000

## 2017-01-31 MED ORDER — FUROSEMIDE 10 MG/ML IJ SOLN
20.0000 mg | Freq: Once | INTRAMUSCULAR | Status: AC
  Administered 2017-01-31: 20 mg via INTRAVENOUS
  Filled 2017-01-31: qty 2

## 2017-01-31 MED ORDER — CHLORHEXIDINE GLUCONATE 0.12 % MT SOLN
OROMUCOSAL | Status: AC
  Filled 2017-01-31: qty 15

## 2017-01-31 MED ORDER — INSULIN ASPART 100 UNIT/ML ~~LOC~~ SOLN: 1.0000 [IU] | Freq: Three times a day (TID) | SUBCUTANEOUS | Status: DC

## 2017-01-31 MED ORDER — DEXTROSE 50 % IV SOLN
INTRAVENOUS | Status: AC
  Administered 2017-01-31: 25 mL
  Filled 2017-01-31: qty 50

## 2017-01-31 NOTE — Progress Notes (Signed)
Updated report received in patient's room via Josh RN, reviewed events of the day, assumed care of patient.

## 2017-01-31 NOTE — Progress Notes (Signed)
Cortrak Tube Team Note:  Consult received to place a Cortrak feeding tube.   A 10 F Cortrak tube was placed in the left nare and secured with a nasal bridle at 75 cm. Per the Cortrak monitor reading the tube tip is post pyloric.   No x-ray is required. RN may begin using tube.   If the tube becomes dislodged please keep the tube and contact the Cortrak team at [email or web address] (password [email or web address]) for replacement.  If after hours and replacement cannot be delayed, place a NG tube and confirm placement with an abdominal x-ray.    Sulayman Manning MS, RD, LDN Pager #- [phone] After Hours Pager: [phone]   

## 2017-01-31 NOTE — Progress Notes (Signed)
Physical Therapy Treatment Patient Details Name: Jay Stephens MRN: [id number] DOB: 04/14/1946 Today's Date: 01/31/2017    History of Present Illness 71 year old readmitted from SNF with AMS and dyspnea on 8/3, the same day he was discharged. Required intubation with self extubation both 8/5 and 8/8 and chest tube discontinued 8/4. PMH: recent aspiration PNA with empyema requiring chest tube and prolonged hospitalization, protein calorie malnutrition, HTN, diastolic heart failure.    PT Comments    Pt remains very weak with little reserve. Continue to expect that progress will be slow   Follow Up Recommendations  SNF     Equipment Recommendations  Other (comment) (To b)    Recommendations for Other Services       Precautions / Restrictions Precautions Precautions: Fall Restrictions Weight Bearing Restrictions: No    Mobility  Bed Mobility Overal bed mobility: Needs Assistance Bed Mobility: Supine to Sit;Sit to Sidelying     Supine to sit: +2 for physical assistance;Mod assist Sit to supine: +2 for physical assistance;Min assist   General bed mobility comments: Assist to bring legs off of bed, elevate trunk into sitting, and bring hips to EOB. Assist to lower trunk and bring feet up into bed  Transfers Overall transfer level: Needs assistance Equipment used: Rolling walker (2 wheeled) Transfers: Sit to/from Stand Sit to Stand: +2 physical assistance;Mod assist         General transfer comment: Assist to bring hips up and for balance. LLE knee in flexion.  Ambulation/Gait                 Stairs            Wheelchair Mobility    Modified Rankin (Stroke Patients Only)       Balance Overall balance assessment: Needs assistance Sitting-balance support: No upper extremity supported Sitting balance-Leahy Scale: Fair     Standing balance support: Bilateral upper extremity supported Standing balance-Leahy Scale: Poor Standing balance comment:  walker and mod assist to stand. Pt with lt knee in flexion. Pt leaning lt. On second stand pt unable to fully extend trunk and hips.                            Cognition Arousal/Alertness: Awake/alert Behavior During Therapy: Flat affect;Impulsive Overall Cognitive Status: Impaired/Different from baseline Area of Impairment: Safety/judgement;Following commands;Problem solving;Memory;Orientation                 Orientation Level: Disoriented to;Place;Time;Situation   Memory: Decreased short-term memory Following Commands: Follows one step commands with increased time Safety/Judgement: Decreased awareness of safety;Decreased awareness of deficits   Problem Solving: Slow processing;Decreased initiation;Difficulty sequencing;Requires verbal cues;Requires tactile cues        Exercises      General Comments        Pertinent Vitals/Pain Pain Assessment: Faces Faces Pain Scale: No hurt    Home Living                      Prior Function            PT Goals (current goals can now be found in the care plan section) Progress towards PT goals: Not progressing toward goals - comment (continued weakness)    Frequency    Min 2X/week      PT Plan Current plan remains appropriate    Co-evaluation              AM-PAC PT "6 Clicks"  Daily Activity  Outcome Measure  Difficulty turning over in bed (including adjusting bedclothes, sheets and blankets)?: Total Difficulty moving from lying on back to sitting on the side of the bed? : Total Difficulty sitting down on and standing up from a chair with arms (e.g., wheelchair, bedside commode, etc,.)?: Total Help needed moving to and from a bed to chair (including a wheelchair)?: Total Help needed walking in hospital room?: Total Help needed climbing 3-5 steps with a railing? : Total 6 Click Score: 6    End of Session Equipment Utilized During Treatment: Gait belt Activity Tolerance: Patient  tolerated treatment well Patient left: in bed;with call bell/phone within reach;with bed alarm set Nurse Communication: Mobility status PT Visit Diagnosis: Unsteadiness on feet (R26.81);Other abnormalities of gait and mobility (R26.89);Muscle weakness (generalized) (M62.81);Adult, failure to thrive (R62.7)     Time: 8119-1478 PT Time Calculation (min) (ACUTE ONLY): 22 min  Charges:  $Therapeutic Activity: 8-22 mins                    G Codes:       [facility] PT [phone]    Angelina Ok [place] 01/31/2017, 4:12 PM [place] PT [phone]

## 2017-01-31 NOTE — Progress Notes (Signed)
Daily Progress Note   Patient Name: Jay Stephens       Date: 01/31/2017 DOB: [birth year]  Age: 71 y.o. MRN#: [id number] Attending Physician: Leeanne Rio, MD Primary Care Physician: System, Pcp Not In Admit Date: 02/18/2017  Reason for Consultation/Follow-up: Establishing goals of care   Subjective: Patient resting in bed this afternoon. He is alert and oriented to himself. He is confused with additional questions. He has mitten restraints intact. He is requesting water, and has a oral swab with water at bedside.   Spoke with the nephew (POA) on the phone and he is able to conference call at 3:30 tomorrow afternoon. Will discuss goals of care. He is hoping to hear expectations of treatments from primary team. Will conference call with Family Medicine Team.    Length of Stay: 10  Current Medications: Scheduled Meds:  . chlorhexidine      . chlorhexidine gluconate (MEDLINE KIT)  15 mL Mouth Rinse BID  . heparin subcutaneous  5,000 Units Subcutaneous Q8H  . insulin aspart  2-6 Units Subcutaneous Q4H  . ipratropium-albuterol  3 mL Nebulization BID  . mouth rinse  15 mL Mouth Rinse QID    Continuous Infusions: . piperacillin-tazobactam (ZOSYN)  IV Stopped (01/31/17 1030)    PRN Meds: acetaminophen, albuterol, RESOURCE THICKENUP CLEAR  Physical Exam  Constitutional:  Frail cachectic older man; in no acute distress. Pleasantly confused  HENT:  Head: Normocephalic and atraumatic.  Neck: Normal range of motion.  Cardiovascular:  Warm and dry  Pulmonary/Chest: Effort normal. No respiratory distress.  Abdominal: Soft. He exhibits no distension.  Musculoskeletal: Normal range of motion.  Neurological: He is alert.  Oriented to himself Recognizes his family Doesn't understand  his clinical condition  Psychiatric: He has a normal mood and affect.  Patient is confused; having difficulty with hospital procedures such as he pulled out his IV today because it burned  Nursing note and vitals reviewed.           Vital Signs: BP (!) 143/96   Pulse 92   Temp 97.9 F (36.6 C) (Axillary)   Resp 15   Ht _0  (1.753 m)   Wt 59.4 kg (131 lb)   SpO2 100%   BMI 19.35 kg/m  SpO2: SpO2: 100 % O2 Device: O2 Device: Nasal Cannula O2 Flow Rate:  O2 Flow Rate (L/min): 2 L/min  Intake/output summary:   Intake/Output Summary (Last 24 hours) at 01/31/17 1341 Last data filed at 01/31/17 0850  Gross per 24 hour  Intake              210 ml  Output             1400 ml  Net            -1190 ml   LBM: Last BM Date: 01/29/17 Baseline Weight: Weight: 64 kg (141 lb 1.5 oz) Most recent weight: Weight: 59.4 kg (131 lb)       Palliative Assessment/Data: 30%    Flowsheet Rows     Most Recent Value  Intake Tab  Referral Department  Hospitalist  Unit at Time of Referral  Med/Surg Unit  Palliative Care Primary Diagnosis  Sepsis/Infectious Disease  Date Notified  01/27/17  Palliative Care Type  Return patient Palliative Care  Reason for referral  Clarify Goals of Care, Psychosocial or Spiritual support  Date of Admission  02/05/2017  Date first seen by Palliative Care  01/28/17  # of days Palliative referral response time  1 Day(s)  # of days IP prior to Palliative referral  6  Clinical Assessment  Palliative Performance Scale Score  30%  Pain Max last 24 hours  Not able to report  Pain Min Last 24 hours  Not able to report  Dyspnea Max Last 24 Hours  Not able to report  Dyspnea Min Last 24 hours  Not able to report  Nausea Max Last 24 Hours  Not able to report  Nausea Min Last 24 Hours  Not able to report  Anxiety Max Last 24 Hours  Not able to report  Anxiety Min Last 24 Hours  Not able to report  Other Max Last 24 Hours  Not able to report  Psychosocial & Spiritual  Assessment  Palliative Care Outcomes  Patient/Family meeting held?  No  Who was at the meeting?  scheduled for 11am 8/11  Palliative Care Outcomes  Provided psychosocial or spiritual support  Palliative Care follow-up planned  Yes, Facility      Patient Active Problem List   Diagnosis Date Noted  . Palliative care by specialist   . Obtundation 01/22/2017  . Aspiration pneumonia ([place]) 01/22/2017  . Ascites 01/22/2017  . Emphysema lung ([place]) 01/22/2017  . Aortic atherosclerosis ([place]) 01/22/2017  . Echocardiogram shows left ventricular diastolic dysfunction, Grade Two 01/22/2017  . Respiratory distress 01/24/2017  . Pressure ulcer of sacrum 01/27/2017  . Pleural effusion on left   . Pneumothorax on right   . Palliative care encounter   . Protein-calorie malnutrition, severe ([place]) 01/11/2017  . Acute respiratory failure with hypoxia ([place])   . Empyema ([place])   . 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 Altered mental status 12/20/2016    Palliative Care Assessment & Plan   Patient Profile:  71 y.o.malewith PMH is significant for right sided empyema s/p drainage via chest tube, altered mental status, R facial droop, tooth abscess s/p extraction and I&D, protein-calorie malnutrition, electrolyte abnormalities and generalized weakness admitted with acute resp failure from Strep pneumonia and re accumulation of R empyema. Intubated on 8/5 - 8/9. Rapid response 8/10 for resp. distress w/CCM and Cards called  Pt seen by cardiology 01/30/17 secondary elevated enzymes: NSTEMI vs demand ischemia. At this point felt to be too fragile for additional cardiac interventions. Blood cx drawn 8/10 NGTD   Recommendations/Plan:  Plans  for multidisciplinary mtg with family Medicine Team and PMT provider for Goals of Care tomorrow at 3:30 with pt's nephew, HCPOA.  Goals of Care and Additional Recommendations:  Limitations on Scope of Treatment: Full Scope Treatment  Code Status:    Code  Status Orders        Start     Ordered   01/22/17 0203  Full code  Continuous     01/22/17 0202    Code Status History    Date Active Date Inactive Code Status Order ID Comments User Context   01/16/2017  9:48 PM 02/17/2017  7:19 PM Full Code [id number]  Carlyle Dolly, MD Inpatient       Prognosis:   Unable to determine  Discharge Planning:  To Be Determined  Care plan was discussed with Intern with Family Medicine Service.   Thank you for allowing the Palliative Medicine Team to assist in the care of this patient.   Total Time 20 min Prolonged Time Billed  no       Greater than 50%  of this time was spent counseling and coordinating care related to the above assessment and plan.  Asencion Gowda, NP   Vinie Sill, NP Palliative Medicine Team Pager # [phone] (M-F 8a-5p) Team Phone # [phone] (Nights/Weekends)  Please contact Palliative Medicine Team phone at [phone] for questions and concerns.

## 2017-01-31 NOTE — Progress Notes (Signed)
CSW continuing to follow patient. Palliative consulted for goals of care. RNCM following as well. CSW will remain available for disposition planning and will assist as needed.  Abigail ButtsSusan Giovanie Lefebre, LCSWA [phone]

## 2017-01-31 NOTE — Progress Notes (Signed)
Nutrition Follow-up  DOCUMENTATION CODES:   Severe malnutrition in context of chronic illness  INTERVENTION:    Jevity 1.2 at 20 ml/h, increase by 10 ml every 4 hours to goal rate of 70 ml/h (1680 ml per day)  Provides 2016 kcal, 93 gm protein, 1361 ml free water daily  NUTRITION DIAGNOSIS:   Malnutrition (severe) related to chronic illness (dysphagia, dental abscess) as evidenced by severe depletion of body fat, severe depletion of muscle mass.  Ongoing  GOAL:   Patient will meet greater than or equal to 90% of their needs  Unmet  MONITOR:   Diet advancement, PO intake, Weight trends, Labs, I & O's  REASON FOR ASSESSMENT:   Consult Enteral/tube feeding initiation and management  ASSESSMENT:   71 yo male who was just discharged on 8/3 (AMS & R sided empyema) to SNF. He has PMH of severe malnutrition, tobacco abuse, recent tooth extractions & I&D for dental abscess. He was re-admitted on 8/3 with respiratory distress.   Patient remains NPO.  Noted poor prognosis, however, family and patient has decided for feeding tube placement for enteral nutrition and medications. Palliative Care Team following. S/P Cortrak tube placement today, tip is post pyloric.  Received MD Consult for TF initiation and management. Labs and medications reviewed. CBG's: [phone]  Diet Order:  Diet NPO time specified  Skin:  Wound (see comment) (stage II sacrum)  Last BM:  8/11  Height:   Ht Readings from Last 1 Encounters:  01/27/17 5\' 9"  (1.753 m)    Weight:   Wt Readings from Last 1 Encounters:  01/31/17 131 lb (59.4 kg)    Ideal Body Weight:  72.7 kg  BMI:  Body mass index is 19.35 kg/m.  Estimated Nutritional Needs:   Kcal:  1800-2100  Protein:  90-110 gm  Fluid:  2 L  EDUCATION NEEDS:   No education needs identified at this time  Joaquin CourtsKimberly Harris, RD, LDN, CNSC Pager [phone] After Hours Pager [phone]

## 2017-01-31 NOTE — Progress Notes (Signed)
PULMONARY / CRITICAL CARE MEDICINE   Name: Jay Stephens MRN: [id number] DOB: [birth year]    ADMISSION DATE:  01/29/2017 CONSULTATION DATE:  01/22/2013  REFERRING MD:  Dr. Roxan Hockey / CVTS  CHIEF COMPLAINT:  Complicated Right Pleural Space  HISTORY OF PRESENT ILLNESS:  71 y.o. debilitated man w hx malnutrition, HTN and diastolic dysfxn, recent aspiration PNA c/b S viridans R empyema. This was drained by chest tube with improvement but some residual fluid pockets of fluid by CT chest 01/16/17. He was discharged to SNF on 8/3 but returned later the same day with encephalopathy, dyspnea, hypoxemia. Treated with broad spectrum Abx and transiently required BiPAP. Repeat CT scan 8/4 shows multiple large fluid collections largest posterior R chest, other possibly contiguous collections. TCTS and PCCM consulted to help w management.   SUBJECTIVE:   Awake in room, intermittently on 2L [place] No dyspnea Cortrak to be placed today  VITAL SIGNS: BP (!) 144/95   Pulse 94   Temp 97.6 F (36.4 C)   Resp 18   Ht _0  (1.753 m)   Wt 131 lb (59.4 kg)   SpO2 98%   BMI 19.35 kg/m   HEMODYNAMICS:   VENTILATOR SETTINGS:   INTAKE / OUTPUT: I/O last 3 completed shifts: In: 713 [I.V.:240; Blood:323; IV Piggyback:150] Out: 1875 [Urine:1875]  PHYSICAL EXAMINATION: General: chronically ill, cachetic appearing male in NAD, lying in bed HEENT: MM pink/moist, no jvd PSY: calm Neuro: Awake, alert, oriented to self, speech soft, follows commands, MAE CV: s1s2 rrr, no murmur PULM: even/non-labored, scattered rhonchi, diminished in bases, unable to cough HF:GBMS, NT , BS +  Extremities: warm/dry, no edema  Skin: no rashes or lesions   LABS:  BMET  Recent Labs Lab 01/29/17 0355 01/30/17 0608 01/31/17 0014  NA 150* 151* 151*  K 3.8 3.0* 3.8  CL 111 110 110  CO2 _1 BUN 27* 19 21*  CREATININE 1.37* 1.41* 1.53*  GLUCOSE 110* 110* 144*    Electrolytes  Recent Labs Lab 01/26/17 0206  01/27/17 0301 01/28/17 0307 01/29/17 0355 01/30/17 0608 01/31/17 0014  CALCIUM 7.4* 7.6* 7.7* 7.9* 8.0* 8.1*  MG 1.8 2.0 2.0  --   --   --   PHOS 2.7 3.6 3.8  --   --   --     CBC  Recent Labs Lab 01/29/17 1055 01/30/17 0608 01/31/17 0014  WBC 5.0 6.0 6.3  HGB 7.5* 8.6* 8.7*  HCT 25.2* 27.7* 28.0*  PLT 253 249 219    Coag's No results for input(s): APTT, INR in the last 168 hours.  Sepsis Markers  Recent Labs Lab 01/24/17 0858 01/25/17 0523 01/26/17 0206  PROCALCITON 2.66 1.92 1.23    ABG  Recent Labs Lab 01/28/17 2110  PHART 7.424  PCO2ART 34.9  PO2ART 59.3*    Liver Enzymes  Recent Labs Lab 01/26/17 0206 01/27/17 0301 01/28/17 0307  ALBUMIN 1.3* 1.4* 1.4*    Cardiac Enzymes  Recent Labs Lab 01/30/17 1211 01/30/17 1827 01/31/17 0014  TROPONINI 3.69* 2.92* 3.45*    Glucose  Recent Labs Lab 01/30/17 1117 01/30/17 1709 01/30/17 1932 01/31/17 0026 01/31/17 0421 01/31/17 0721  GLUCAP 112* 126* 91 128* 132* 125*    Imaging Dg Chest Port 1 View  Result Date: 01/31/2017 CLINICAL DATA:  Empyema, dyspnea EXAM: PORTABLE CHEST 1 VIEW COMPARISON:  01/28/2017 chest radiograph. FINDINGS: Stable cardiomediastinal silhouette with normal heart size. No pneumothorax. Stable volume loss in the right hemithorax. Stable circumferential pleural thickening/effusion throughout the  right hemithorax, not appreciably changed. No left pleural effusion. Patchy opacity throughout the right lung is stable. Clear left lung. IMPRESSION: Stable chest radiograph with stable pleural thickening/effusion throughout the right hemithorax and stable patchy opacity throughout the right lung. Electronically Signed   By: Ilona Sorrel M.D.   On: 01/31/2017 07:36   STUDIES:  CT CHEST W/ CONTRAST 7/29: IMPRESSION: 1. Significantly improved right-sided empyema. Small pockets of residual fluid are seen along the lateral chest wall and fissures. Pneumothorax is small and stable  from chest x-ray earlier today. 2. Pneumonia and atelectasis on the right, greatest in the lower lobe. 3. Diminished concern for obstructing airway lesion when compared to admission CT, when there was bronchial distortion due to the pleural effusion. Recommend three-month follow-up CT in this smoker with nonspecific adenopathy. 4. Small upper abdominal ascites that is newly seen. 5. Aortic Atherosclerosis (ICD10-I70.0) and Emphysema (ICD10-J43.9). 6. 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 PORT CXR 7/30:  Minimal residual right pleural effusion. Minimal residual right pneumothorax. Right-sided chest tube directed caudally. PORT CXR 7/31:  Small right apical pneumothorax. No appreciable change compared with chest x-ray imaging from last evening. No new focal opacity appreciated. No clear evolution of or development of right pleural effusion. CT CHEST W/ CONTRAST 8/4: IMPRESSION: 1. Large complex collections of fluid and air throughout the right chest, locations and measurements given above, largest collection along the posterior right chest measuring over 20 cm greatest dimension, presumed empyemas. These findings represent a significant worsening compared to the earlier chest CT of 01/16/2017. These collections appear to be contiguous but are also likely multiloculated to some degree. Right-sided chest tube has been removed in the interval. 2. Moderate-sized left pleural effusion. 3. Mediastinal and perihilar lymphadenopathy is likely reactive in nature. 4. Upper abdominal ascites, incompletely imaged. PORT CXR 8/5:   Right-sided chest tube in good position. Endotracheal tube in good position. Enteric feeding tube coursing below diaphragm. Right lung opacities persistent. CT CHEST W/O 8/7: IMPRESSION: 1. Multifactorial degradation, as detailed above. 2. Similar appearance and configuration of multiple loculated right-sided pleural fluid collections, most  consistent with empyemas. Small foci of pleural gas are not significantly changed.  3. Placement of a right-sided large-bore chest tube since the prior CT. The chest tube is surrounded by pulmonary parenchyma, and is likely not sufficiently draining the pleural space. 4.  Emphysema (ICD10-J43.9). 5. Coronary artery atherosclerosis. Aortic Atherosclerosis (ICD10-I70.0). 6. Similar small left pleural effusion. 7. Upper abdominal ascites. PORT CXR 8/8:  Enteric feeding tube coursing below diaphragm. Endotracheal tube in good position. Right chest tube in good position. Slight improvement in right lung patchy opacification with residual pleural effusion still present. PORT CXR 8/8:  Endotracheal and enteric feeding tubes removed. Right chest tube removed. Lordotic view with slight rotation to the right. Increased opacity right lung with residual effusion.   MICROBIOLOGY: MRSA PCR 7/24:  Negative Blood Cultures x2 7/24:  Negative  Right BAL 7/24:  Multiple Organisms Right Pleural Fluid culture 7/24:  Viridans streptococci  MRSA PCR 8/1:  Negative Blood Cultures x2 8/3: negative Urine Culture 8/3:  Negative  MRSA PCR 8/4:  Negative  Tracheal Aspirate Culture 8/6:  Negative  Urine Culture 8/6:  Negative  Blood Cultures x2 8/6:  negative Blood Cultures 8/10 >>  ANTIBIOTICS: Vancomycin 7/24 - 7/25 Zosyn 7/24 - 7/28 Ancef 7/28 - 8/3 Vancomycin 8/3  - 8/8  Zosyn 8/3 >>>  SIGNIFICANT EVENTS: 7/23 - admit to [facility] 7/24 - rapid response,  intubated, bronchoscopy, R chest tube placed with ~3L milky yellow pus drained 7/29 - chest tube to water seal w/ small residual pneumothorax 7/30 - chest tube removed w/ small residual right pneumothorax 8/03 - discharged & returned promptly  8/05 - self-extubated & was reintubated w/ 7.5 ETT from 8.0 ETT 8/06 - febrile >> recultured  8/08 - self-extubated despite restraints. Chest tube removed.  8/10 - on floor, no distress  LINES/TUBES: OETT 7.5 8/5 -  8/8 (self extubated twice now) OGT 8/5 - 8/8 R CHEST TUBE 8/5 - 8/8 PIV  ASSESSMENT / PLAN:  PULMONARY A: Acute Hypoxic Respiratory Failure:  Self-extubated x2 - last 8/9  Hemoptysis - occurred with intrapleural lytic therapy.  Resolved.  Right Empyema - S/P removal of chest tube 8/8. Apnea - No respiratory effort. Suspect secondary to Fentanyl drip. - resolved Severe Aspiration Risk  -previously intolerant of intrapleural lytic therapy with hemoptysis  P:   Follow intermittent CXR 8/13 appears stable, if minimally improved  Monitor saturations, wean O2 for sats >92% Pulmonary hygiene - IS, mobilize as able Cortrak to placed 8/13, otherwise NPO 2/2 severe aspiration risk.   CT surgery following, appreciate input for recommendations   INFECTIOUS A:   Streptococcus Pneumonia w/ Empyema  Sepsis P:   Follow cultures  D10/x Zosyn- will need at least 14 days Recheck PCT   FAMILY  - Updates: No family at bedside.  PMT following.      Kennieth Rad, AGACNP-BC [facility] Pgr: [phone] or if no answer [phone] 01/31/2017, 8:39 AM   Attending Note:  I have examined patient, reviewed labs, studies and notes. I have discussed the case with B Simpson, and I agree with the data and plans as amended above.   71 year old man with a history of chronic aspiration and a comp complicated right pleural space following recurrent aspiration pneumonias. Course complicated by hemoptysis and respiratory failure after we attempted intrapleural lytics and DNase. He has been on broad-spectrum antibiotics for presumed empyema. On my evaluation he is awake, interacting. He is very thin and globally weak. He has coarse right-sided breath sounds. This very difficult case in that options are limited. He will not tolerate instillation of alteplase/DNase into his chest tube. He is also a poor overall candidate for chest surgery, and I doubt that Thoracic Surgery will recommend that he  has a chance to benefit from VATS decortication. I believe he will have to be treated medically and then followed. Reasonable anabiotic course would be 14 days total which he has almost completed. He would be at high risk for recurrent infection and decompensation. Given his chronic aspiration and debilitation, inability to tolerate definitive therapy, I believe we need to continue discussions regarding goals of care. I agree with the Palliative Care assessment that he is nearing the end of his life, may not survive this illness.   [name], MD, PhD 01/31/2017, 1:42 PM [facility] [phone] or if no answer [phone]

## 2017-01-31 NOTE — Progress Notes (Signed)
Progress Note  Patient Name: Jay Stephens Date of Encounter: 01/31/2017  Primary Cardiologist:   New  Subjective   Denies pain or SOB.    Inpatient Medications    Scheduled Meds: . chlorhexidine gluconate (MEDLINE KIT)  15 mL Mouth Rinse BID  . heparin subcutaneous  5,000 Units Subcutaneous Q8H  . insulin aspart  2-6 Units Subcutaneous Q4H  . ipratropium-albuterol  3 mL Nebulization BID  . mouth rinse  15 mL Mouth Rinse QID   Continuous Infusions: . sodium chloride    . dextrose 10 mL/hr at 01/29/17 2204  . piperacillin-tazobactam (ZOSYN)  IV 3.375 g (01/31/17 0559)   PRN Meds: acetaminophen, albuterol, RESOURCE THICKENUP CLEAR   Vital Signs    Vitals:   01/30/17 2300 01/31/17 0424 01/31/17 0723 01/31/17 0911  BP: 136/82 (!) 136/97 (!) 144/95   Pulse: 97 95 94 89  Resp: (!) 25 (!) 21 18 (!) 22  Temp: 97.6 F (36.4 C) 98 F (36.7 C) 97.6 F (36.4 C)   TempSrc:      SpO2: 93% 94% 98% 100%  Weight:  131 lb (59.4 kg)    Height:        Intake/Output Summary (Last 24 hours) at 01/31/17 0937 Last data filed at 01/31/17 0850  Gross per 24 hour  Intake              210 ml  Output             1400 ml  Net            -1190 ml   Filed Weights   01/27/17 1609 01/30/17 0454 01/31/17 0424  Weight: 132 lb 8 oz (60.1 kg) 128 lb (58.1 kg) 131 lb (59.4 kg)    Telemetry    NSR - Personally Reviewed  ECG    NA - Personally Reviewed  Physical Exam   GEN: No acute distress.  Chronically ill appearing  Neck: No  JVD Cardiac: RRR, no murmurs, rubs, or gallops.  Respiratory: Clear  to auscultation bilaterally. GI: Soft, nontender, non-distended  MS: No  edema; No deformity. Neuro:  Nonfocal  Psych: Flat but responded brightly when I asked him if he was hungry.   Labs    Chemistry Recent Labs Lab 01/26/17 0206 01/27/17 0301 01/28/17 0307 01/29/17 0355 01/30/17 0608 01/31/17 0014  NA 143 147* 147* 150* 151* 151*  K 3.4* 4.4 4.0 3.8 3.0* 3.8  CL 107 108  109 111 110 110  CO2 29 26 29 25 31 26   GLUCOSE 90 148* 141* 110* 110* 144*  BUN 18 20 25* 27* 19 21*  CREATININE 1.14 1.17 1.28* 1.37* 1.41* 1.53*  CALCIUM 7.4* 7.6* 7.7* 7.9* 8.0* 8.1*  ALBUMIN 1.3* 1.4* 1.4*  --   --   --   GFRNONAA >60 >60 55* 51* 49* 45*  GFRAA >60 >60 >60 59* 57* 52*  ANIONGAP 7 13 9 14 10 15      Hematology Recent Labs Lab 01/29/17 1055 01/30/17 0608 01/31/17 0014  WBC 5.0 6.0 6.3  RBC 2.92* 3.20* 3.13*  HGB 7.5* 8.6* 8.7*  HCT 25.2* 27.7* 28.0*  MCV 86.3 86.6 89.5  MCH 25.7* 26.9 27.8  MCHC 29.8* 31.0 31.1  RDW 16.4* 16.0* 16.9*  PLT 253 249 219    Cardiac Enzymes Recent Labs Lab 01/30/17 0900 01/30/17 1211 01/30/17 1827 01/31/17 0014  TROPONINI 3.90* 3.69* 2.92* 3.45*   No results for input(s): TROPIPOC in the last 168 hours.  BNP Recent Labs Lab 01/28/17 2109  BNP 1,560.6*     DDimer No results for input(s): DDIMER in the last 168 hours.   Radiology    Dg Chest Port 1 View  Result Date: 01/31/2017 CLINICAL DATA:  Empyema, dyspnea EXAM: PORTABLE CHEST 1 VIEW COMPARISON:  01/28/2017 chest radiograph. FINDINGS: Stable cardiomediastinal silhouette with normal heart size. No pneumothorax. Stable volume loss in the right hemithorax. Stable circumferential pleural thickening/effusion throughout the right hemithorax, not appreciably changed. No left pleural effusion. Patchy opacity throughout the right lung is stable. Clear left lung. IMPRESSION: Stable chest radiograph with stable pleural thickening/effusion throughout the right hemithorax and stable patchy opacity throughout the right lung. Electronically Signed   By: Ilona Sorrel M.D.   On: 01/31/2017 07:36    Cardiac Studies   ECHO 01/12/17  Study Conclusions  - Left ventricle: The cavity size was normal. Systolic function was   normal. The estimated ejection fraction was in the range of 55%   to 60%. The apical 3 chambe view is poor quality due to   ventilated patient. Images are  suggestive of akinesis of the   inferolateral myocardium. Features are consistent with a   pseudonormal left ventricular filling pattern, with concomitant   abnormal relaxation and increased filling pressure (grade 2   diastolic dysfunction). - Aortic valve: Trileaflet; moderately thickened, moderately   calcified leaflets. - Tricuspid valve: There was trivial regurgitation. - Pulmonic valve: Poorly visualized.  Patient Profile     71 y.o. male with PMH is significant for right sided empyema s/p drainage via chest tube, altered mental status, R facial droop, tooth abscess s/p extraction and I&D, protein-calorie malnutrition, electrolyte abnormalities and generalized weakness admitted with acute resp failure from Strep pneumonia and re accumulation of R empyema. Intubated on 8/5 - 8/9. Rapid response 8/10 for resp. distress w/CCM and Cards called fore elevated troponin.  Assessment & Plan    ELEVATED TROPONIN:  Probable demand ischemia.  Not a candidate for invasive or non invasive evaluation.  No complaints of an acute coronary nature.  Hemodynamics OK.  I would suggest low dose ASA if he is not thought to have any active GI blood loss and he can take POs eventually or receives a feeding tube.  Otherwise not much to offer as we will follow as needed pending the outcome on decisions about feeding and palliative care.   Signed, Minus Breeding, MD  01/31/2017, 9:37 AM

## 2017-01-31 NOTE — Progress Notes (Signed)
Family Medicine Teaching Service Daily Progress Note Intern Pager: [phone]  Patient name: Jay HibbsJessie Stephens Medical record number: [id number] Date of birth: 08/31/1945 Age: 71 y.o. Gender: male  Primary Care Provider: System, Pcp Not In Consultants: palliative, CCM, cardio Code Status: full  Pt Overview and Major Events to Date:  Jay CoasterJessie Bestis a 71 y.o.malewith PMH is significant for right sided empyema s/p drainage via chest tube, altered mental status, R facial droop, tooth abscess s/p extraction and I&D, protein-calorie malnutrition, electrolyte abnormalities and generalized weakness admitted with acute resp failure from Strep pneumonia and re accumulation of R empyema. Intubated on 8/5 - 8/9. Rapid response 8/10 for resp. distress w/CCM and Cards called, no longer in respiratory distress.   High troponins from 8/11-8/13 with oxygen demand and increased altered mental status.  PLAN: Pulmonary:  Dyspnea secondary to strep pneumonia w/ R empyema: Not septic anymore. Dyspnea improved, on 2 L [place] with O2 in high 90's-100% per documentation. Tachypmic overnight in 20s. On 5L nasal canula suring exam. Afebrile and no leukocytosis. Blood cx drawn on 8/10 NGTD.  - CCM following, appreciate their assistance -patient has self-extubated twice during prior ICU stay -O2 as needed for O2 <92% -continue zosyn -continuous pulse ox -daily CBC, BMP -NPO- risk for aspiration -vitals per unit routine - CXR showed slight improvement but due to tachypnea, will DC fluids and use lasix.  Cardiac:  Elevated troponin/ST changes:: Troponin 3.9<3.69<2.92<3.45 from 8/12 to AM 8/13. ECG showed ST changes. Evaluated by cardiology, determined to be likely due to hypoxia from respiratory issues and anemia. Troponin 3.9 this morning. Denies chest pain or shortness of breath -call cardiology if new symptoms develop.  - Repeat EKGs daily  HFpEF: 01/12/17 Echo EF55%to 60% with G2DD. Has 1+ pitting edema in ankles. 2 L  UO over last 24 hours. 800 output total on 8/12 - lasix - Continue strict I/Os and daily weights  GI Aspiration risk and NPO: Patient went for modified barium swallow and recommendations are nothing by mouth secondary to severe aspiration risk -NPO  -Cortrak to be placed today  Protein-calorie malnutrition: Patient is cachectic, which is likely due to a chronic illness such as cancer as well as his low function at baseline. Nephew reported earlier that he has to be fed at home. Albumin on admission is 1.4. Seen by nutrition and palliative care during his recent hospitalization. Is NPO for now as he failed swallow evaluation. Cortrak attempt was placed on 8/11, failed.  -monitor Mag, Phos, K daily  -continue to follow nutrition and palliative care recs - D50 bolus as needed   Renal Electrolyte abnormalities: Hypokalemia resolved, K of 3.8 this morning. Na 151.   AKI: Cr 1.53 this morningm, trending up for 4 days from 1.28. Likely pre renal etiology - Daily BMP - Attempt to avoid nephrotoxic agents  Foley catheter in Place  Infectious:  Tooth abscess s/p extraction and I&D:Patient denied pain in mouth. No obvious bleeding.Since the extraction occurred so recently (on 8/1), will need to be monitored for any infection or bleeding. -continue to monitor daily -chlorhexadine mouthwash TID until 8/15  Endocrine Hypoglycemia: Glucose continues to dip occasionally into the 50's. Not being fed. He has very little reserve. Glucose 110 this AM - Continue D10 @ 10cc/hr  Social/Disposition Goals of Care: Family wants full code for now. Patient only oriented to self today.  -Palliative arranged conference call @3 :30pm 8/14, meet at patient's room - Palliative consulted, appreciate their assistance  Hematological Anemia: currently 8.7Hgb. Transfusion threshold <  8 -Received 1 unit p RBCs for hgb to 7.5 on 8/11. Hgb A1C 8.6 after transfusion.  - Daily CBC  Neurological AMS with  R-sided facial droop and weakness:Patient is alert but only oriented to person, seems to be. at baseline for this visit based off previous notes - Continue to monitor   Pain control: -tylenol prn, can do rectal if needed  Fall risk: patient was found on the floor by his bed x 2 since being transferred out of ICU. -sitter ordered but nursing informed us that no sitters available, tele-sitter ordered  Dermatologic:  Sacral ulcer: -foam/turning Q2 per nursing protocol   FEN/GI:NPO Prophylaxis:Lovenox  Disposition:Potential LTAC placement by CM, when medically stable but patient has many medical issues that need to be addressed  Subjective:  Patient unable to respond verbally beyond "hi" but would make eye contact on verbal interaction, wearing mitts.  Objective: Temp:  [97.6 F (36.4 C)-98 F (36.7 C)] 98 F (36.7 C) (08/13 0424) Pulse Rate:  [83-99] 95 (08/13 0424) Resp:  [14-25] 21 (08/13 0424) BP: (133-142)/(82-98) 136/97 (08/13 0424) SpO2:  [92 %-100 %] 94 % (08/13 0424) Weight:  [131 lb (59.4 kg)] 131 lb (59.4 kg) (08/13 0424) Physical Exam: General: frail, laying in bed, in NAD Eyes: EOMI, right eye somewhat laterally deviates ENTM: dry mucous membranes, dry lips Cardiovascular: RRR, systolic murmur, no JVD, 1+ pitting edema in bilateral ankles Respiratory: On 5L nasal canula, tachpynicmid 20s, decreasing lung sounds on right side anteriorly compared to left. Left lung sounds clear on upper lung but rhonchi on lower left lung.  Audible raspy breath   Gastrointestinal: +bowel sounds, nontender/soft belly MSK: thin extremities Neuro: alert and oriented to self only   Laboratory:  Recent Labs Lab 01/29/17 1055 01/30/17 0608 01/31/17 0014  WBC 5.0 6.0 6.3  HGB 7.5* 8.6* 8.7*  HCT 25.2* 27.7* 28.0*  PLT 253 249 219    Recent Labs Lab 01/29/17 0355 01/30/17 0608 01/31/17 0014  NA 150* 151* 151*  K 3.8 3.0* 3.8  CL 111 110 110  CO2 25 31 26   BUN  27* 19 21*  CREATININE 1.37* 1.41* 1.53*  CALCIUM 7.9* 8.0* 8.1*  GLUCOSE 110* 110* 144*     Imaging/Diagnostic Tests: Ct Chest W Contrast  Result Date: 01/22/2017 CLINICAL DATA:  Status post chest tube for right empyema. EXAM: CT CHEST WITH CONTRAST TECHNIQUE: Multidetector CT imaging of the chest was performed during intravenous contrast administration. CONTRAST:  75mL ISOVUE-300 IOPAMIDOL (ISOVUE-300) INJECTION 61% COMPARISON:  Chest CT dated 01/16/2017. Chest x-ray from earlier same day. FINDINGS: Cardiovascular: Heart size is normal. No pericardial effusion. Coronary artery calcifications. No thoracic aortic aneurysm or dissection. Mediastinum/Nodes: Scattered small and moderate-sized lymph nodes within the mediastinum and perihilar regions, likely reactive in nature. Trachea and central bronchi are unremarkable. Lungs/Pleura: Large complex collections are seen throughout the right chest, major components probably contiguous but also likely multiloculated to some degree. Dominant collection of fluid and air along the posterior aspects of the right upper lobe and right lower lobe measures over 23 cm craniocaudal dimension (series 7, image 66) and approximately 12 x 7 cm transverse by AP dimensions near the right lung base (series 7, image 61; series 3, image 126). The posterior collection appears contiguous with an additional large complex collection of fluid and air at the right lung apex (probable connection seen on series 3, image 52). The complex collection in the right lung apex measures 14 cm craniocaudal dimension and 5 cm thickness (series  7, image 54; series 3, image 63). Lastly, additional complex fluid collection is seen along the right lateral chest wall measuring 9 cm AP dimension and 2.5 cm thickness (series 3, image 80). This collection does appear contiguous with the collection at the right lung apex. Moderate-size layering pleural effusion on the left with adjacent compressive  atelectasis. Upper Abdomen: Free fluid/ascites within the upper abdomen, at least a moderate amount, incompletely imaged. Musculoskeletal: No acute or suspicious osseous finding. Right-sided chest tube seen on CT of 01/16/2017 has been removed. IMPRESSION: 1. Large complex collections of fluid and air throughout the right chest, locations and measurements given above, largest collection along the posterior right chest measuring over 20 cm greatest dimension, presumed empyemas. These findings represent a significant worsening compared to the earlier chest CT of 01/16/2017. These collections appear to be contiguous but are also likely multiloculated to some degree. Right-sided chest tube has been removed in the interval. 2. Moderate-sized left pleural effusion. 3. Mediastinal and perihilar lymphadenopathy is likely reactive in nature. 4. Upper abdominal ascites, incompletely imaged. These results were called by telephone at the time of interpretation on 01/22/2017 at 11:00 am to Dr. Acquanetta Belling , who verbally acknowledged these results. Electronically Signed   By: Bary Richard M.D.   On: 01/22/2017 11:17   Dg Chest Port 1 View  Result Date: 01/22/2017 CLINICAL DATA:  Empyema.  Status post chest tube placement. EXAM: PORTABLE CHEST 1 VIEW COMPARISON:  CT chest 0 8/0 scratch the CT chest earlier today. Single-view of the chest 02/16/2017. FINDINGS: A new right chest tube is in place. Loculated pleural fluid collections in the right chest do not appear changed. Airspace disease in the right lung is also not notably changed. The left lung is expanded and clear. Left pleural effusion is noted. IMPRESSION: No marked change in loculated right pleural effusions and airspace disease with a new chest tube in place. Negative for pneumothorax. Small left pleural effusion seen on prior CT scan is not well demonstrated on this exam. Electronically Signed   By: Drusilla Kanner M.D.   On: 01/22/2017 15:51   Dg Chest Portable  1 View  Result Date: 02/11/2017 CLINICAL DATA:  Shortness of breath.  History of empyema. EXAM: PORTABLE CHEST 1 VIEW COMPARISON:  PA and lateral chest 02-06-2017 and 01/18/2017. CT chest 01/16/2017. FINDINGS: Pleural effusion which appears loculated along the right chest wall is again seen. Right basilar effusion is new since the most recent examination. There is new airspace disease throughout the right chest. The left lung is clear. Heart size is normal. IMPRESSION: New airspace disease throughout the right chest with a new right basilar effusion worrisome for pneumonia. Loculated right pleural effusion does not appear notably changed since the most recent exam. Electronically Signed   By: Drusilla Kanner M.D.   On: 02/13/2017 20:03     Marthenia Rolling, DO 01/31/2017, 6:15 AM PGY-1, [facility] FPTS Intern pager: [phone], text pages welcome

## 2017-02-01 LAB — BASIC METABOLIC PANEL
Anion gap: 10 (ref 5–15)
Anion gap: 14 (ref 5–15)
BUN: 21 mg/dL — AB (ref 6–20)
BUN: 27 mg/dL — AB (ref 6–20)
CALCIUM: 8.1 mg/dL — AB (ref 8.9–10.3)
CO2: 30 mmol/L (ref 22–32)
CO2: 19 mmol/L — ABNORMAL LOW (ref 22–32)
CREATININE: 1.7 mg/dL — AB (ref 0.61–1.24)
CREATININE: 1.71 mg/dL — AB (ref 0.61–1.24)
Calcium: 7.8 mg/dL — ABNORMAL LOW (ref 8.9–10.3)
Chloride: 110 mmol/L (ref 101–111)
Chloride: 113 mmol/L — ABNORMAL HIGH (ref 101–111)
GFR calc Af Amer: 46 mL/min — ABNORMAL LOW (ref >60)
GFR calc non Af Amer: 39 mL/min — ABNORMAL LOW (ref >60)
GFR, EST AFRICAN AMERICAN: 45 mL/min — AB (ref >60)
GFR, EST NON AFRICAN AMERICAN: 39 mL/min — AB (ref >60)
GLUCOSE: 101 mg/dL — AB (ref 65–99)
Glucose, Bld: 104 mg/dL — ABNORMAL HIGH (ref 65–99)
Potassium: 2.9 mmol/L — ABNORMAL LOW (ref 3.5–5.1)
Potassium: 5.3 mmol/L — ABNORMAL HIGH (ref 3.5–5.1)
SODIUM: 150 mmol/L — AB (ref 135–145)
SODIUM: 146 mmol/L — AB (ref 135–145)

## 2017-02-01 LAB — BLOOD CULTURE ID PANEL (REFLEXED)
ACINETOBACTER BAUMANNII: NOT DETECTED
CANDIDA KRUSEI: NOT DETECTED
CANDIDA PARAPSILOSIS: NOT DETECTED
CANDIDA TROPICALIS: NOT DETECTED
CARBAPENEM RESISTANCE: NOT DETECTED
Candida albicans: NOT DETECTED
Candida glabrata: NOT DETECTED
ENTEROBACTERIACEAE SPECIES: NOT DETECTED
ESCHERICHIA COLI: NOT DETECTED
Enterobacter cloacae complex: NOT DETECTED
Enterococcus species: NOT DETECTED
Haemophilus influenzae: NOT DETECTED
KLEBSIELLA OXYTOCA: NOT DETECTED
KLEBSIELLA PNEUMONIAE: NOT DETECTED
LISTERIA MONOCYTOGENES: NOT DETECTED
Methicillin resistance: NOT DETECTED
NEISSERIA MENINGITIDIS: NOT DETECTED
Proteus species: NOT DETECTED
Pseudomonas aeruginosa: NOT DETECTED
SERRATIA MARCESCENS: NOT DETECTED
STAPHYLOCOCCUS SPECIES: NOT DETECTED
Staphylococcus aureus (BCID): NOT DETECTED
Streptococcus agalactiae: NOT DETECTED
Streptococcus pneumoniae: NOT DETECTED
Streptococcus pyogenes: NOT DETECTED
Streptococcus species: NOT DETECTED
Vancomycin resistance: NOT DETECTED

## 2017-02-01 LAB — GLUCOSE, CAPILLARY
GLUCOSE-CAPILLARY: 107 mg/dL — AB (ref 65–99)
GLUCOSE-CAPILLARY: 113 mg/dL — AB (ref 65–99)
GLUCOSE-CAPILLARY: 117 mg/dL — AB (ref 65–99)
GLUCOSE-CAPILLARY: 96 mg/dL (ref 65–99)
Glucose-Capillary: 101 mg/dL — ABNORMAL HIGH (ref 65–99)
Glucose-Capillary: 107 mg/dL — ABNORMAL HIGH (ref 65–99)
Glucose-Capillary: 117 mg/dL — ABNORMAL HIGH (ref 65–99)

## 2017-02-01 LAB — MAGNESIUM: MAGNESIUM: 1.7 mg/dL (ref 1.7–2.4)

## 2017-02-01 LAB — CBC
HCT: 27.1 % — ABNORMAL LOW (ref 39.0–52.0)
Hemoglobin: 8.3 g/dL — ABNORMAL LOW (ref 13.0–17.0)
MCH: 27 pg (ref 26.0–34.0)
MCHC: 30.6 g/dL (ref 30.0–36.0)
MCV: 88.3 fL (ref 78.0–100.0)
PLATELETS: 236 10*3/uL (ref 150–400)
RBC: 3.07 MIL/uL — ABNORMAL LOW (ref 4.22–5.81)
RDW: 17.8 % — AB (ref 11.5–15.5)
WBC: 8.2 10*3/uL (ref 4.0–10.5)

## 2017-02-01 LAB — TROPONIN I: Troponin I: 3.48 ng/mL (ref <0.03)

## 2017-02-01 LAB — PHOSPHORUS: PHOSPHORUS: 3.3 mg/dL (ref 2.5–4.6)

## 2017-02-01 MED ORDER — CHLORHEXIDINE GLUCONATE 0.12 % MT SOLN
OROMUCOSAL | Status: AC
  Administered 2017-02-02: 15 mL via OROMUCOSAL
  Filled 2017-02-01: qty 15

## 2017-02-01 MED ORDER — POTASSIUM CHLORIDE 10 MEQ/100ML IV SOLN
10.0000 meq | INTRAVENOUS | Status: AC
  Administered 2017-02-01 (×6): 10 meq via INTRAVENOUS
  Filled 2017-02-01 (×6): qty 100

## 2017-02-01 NOTE — Progress Notes (Signed)
PHARMACY - PHYSICIAN COMMUNICATION CRITICAL VALUE ALERT - BLOOD CULTURE IDENTIFICATION (BCID)  Results for orders placed or performed during the hospital encounter of 01/30/2017  Blood Culture ID Panel (Reflexed) (Collected: 01/28/2017  9:42 PM)  Result Value Ref Range   Enterococcus species NOT DETECTED NOT DETECTED   Vancomycin resistance NOT DETECTED NOT DETECTED   Listeria monocytogenes NOT DETECTED NOT DETECTED   Staphylococcus species NOT DETECTED NOT DETECTED   Staphylococcus aureus NOT DETECTED NOT DETECTED   Methicillin resistance NOT DETECTED NOT DETECTED   Streptococcus species NOT DETECTED NOT DETECTED   Streptococcus agalactiae NOT DETECTED NOT DETECTED   Streptococcus pneumoniae NOT DETECTED NOT DETECTED   Streptococcus pyogenes NOT DETECTED NOT DETECTED   Acinetobacter baumannii NOT DETECTED NOT DETECTED   Enterobacteriaceae species NOT DETECTED NOT DETECTED   Enterobacter cloacae complex NOT DETECTED NOT DETECTED   Escherichia coli NOT DETECTED NOT DETECTED   Klebsiella oxytoca NOT DETECTED NOT DETECTED   Klebsiella pneumoniae NOT DETECTED NOT DETECTED   Proteus species NOT DETECTED NOT DETECTED   Serratia marcescens NOT DETECTED NOT DETECTED   Carbapenem resistance NOT DETECTED NOT DETECTED   Haemophilus influenzae NOT DETECTED NOT DETECTED   Neisseria meningitidis NOT DETECTED NOT DETECTED   Pseudomonas aeruginosa NOT DETECTED NOT DETECTED   Candida albicans NOT DETECTED NOT DETECTED   Candida glabrata NOT DETECTED NOT DETECTED   Candida krusei NOT DETECTED NOT DETECTED   Candida parapsilosis NOT DETECTED NOT DETECTED   Candida tropicalis NOT DETECTED NOT DETECTED     Changes to prescribed antibiotics required: No ID on BCID, await full report.  Vernard GamblesVeronda Josel Keo, PharmD, BCPS  02/01/2017  6:36 AM

## 2017-02-01 NOTE — Progress Notes (Signed)
Sent a text page to the MD re: 8 beat run of V-Tach, asymptomatic, VSS, will continue to monitor and await further orders as needed.

## 2017-02-01 NOTE — Progress Notes (Signed)
  Speech Language Pathology Treatment: Dysphagia  Patient Details Name: Jay HibbsJessie Stephens MRN: [id number] DOB: 01/16/1946 Today's Date: 02/01/2017 Time: [id number] SLP Time Calculation (min) (ACUTE ONLY): 14 min  Assessment / Plan / Recommendation Clinical Impression  Followed up with pt re: dysphagia. Pt alert; oral care provided. Unable to elicit a volitional cough or throat clear indicative of weakened respiratory effort. MBS 8/11 revealed severely decreased laryngeal elevation and pharyngeal constriction resulting in copious pharyngeal residue which he did not sense and was unable to clear with verbal cues. Today pt consumed ice chips with delayed oral transit and suspected delayed initiation of swallow and delayed cough likely resulting from laryngeal penetrates. Pt was very happy to have ice chips stating "this taste so good."Difficult to fully assess if any improvements in swallow ability at bedside although suspect there may not be a significant change from Sun to today. Pt may be discharging to LTAC with Palliative care meeting scheduled. Okay with this SLP if pt have several ice chips PRN after oral care for comfort and increased use of swallow musculature.    HPI HPI: Pt is a 71 y.o.debilitated man w hx malnutrition, HTN and diastolic dysfxn, recent aspiration PNA c/b S viridans R empyema. MBS recommended Dys 1 diet and nectar thick liquids by spoon. He was discharged to SNF on 8/3 but returned later the same day with encephalopathy, dyspnea, hypoxemia, transiently requiring BiPAP. BSE upon readmission recommended the same dysphagia diet and precautions. Repeat CT scan 8/4 shows multiple large fluid collections largest posterior R chest, other possibly contiguous collections. He was intubated on 8/5 with self-extubation and reintubation the same day. He self-extubated again on 8/8.      SLP Plan  Continue with current plan of care       Recommendations  Diet recommendations: NPO Medication  Administration: Via alternative means                Oral Care Recommendations: Oral care QID Follow up Recommendations: Skilled Nursing facility SLP Visit Diagnosis: Dysphagia, oropharyngeal phase (R13.12) Plan: Continue with current plan of care       GO                [name], [place] Keena Willis 02/01/2017, 3:19 PM Breck CoonsLisa Willis Morris Longenecker M.Ed [employer]-SLP Pager [phone]

## 2017-02-01 NOTE — Progress Notes (Signed)
PULMONARY / CRITICAL CARE MEDICINE   Name: Jay Stephens MRN: [id number] DOB: 1946/02/13    ADMISSION DATE:  02/18/2017 CONSULTATION DATE:  01/22/2013  REFERRING MD:  Dr. Roxan Hockey / CVTS  CHIEF COMPLAINT:  Complicated Right Pleural Space  HISTORY OF PRESENT ILLNESS:  71 y.o. debilitated man w hx malnutrition, HTN and diastolic dysfxn, recent aspiration PNA c/b S viridans R empyema. This was drained by chest tube with improvement but some residual fluid pockets of fluid by CT chest 01/16/17. He was discharged to SNF on 8/3 but returned later the same day with encephalopathy, dyspnea, hypoxemia. Treated with broad spectrum Abx and transiently required BiPAP. Repeat CT scan 8/4 shows multiple large fluid collections largest posterior R chest, other possibly contiguous collections. TCTS and PCCM consulted to help w management.   SUBJECTIVE:   Cortrak placed 8/13 On room air at 100% Family meeting with PMT scheduled today at 50 to discuss [facility] Patient repeatedly asking for water, confused.   VITAL SIGNS: BP 138/64 (BP Location: Right Arm)   Pulse 87   Temp 98.7 F (37.1 C) (Axillary)   Resp 13   Ht _0  (1.753 m)   Wt 116 lb (52.6 kg)   SpO2 96%   BMI 17.13 kg/m   HEMODYNAMICS:   VENTILATOR SETTINGS:   INTAKE / OUTPUT: I/O last 3 completed shifts: In: 660 [I.V.:100; NG/GT:410; IV Piggyback:150] Out: 2100 [Urine:2100]  PHYSICAL EXAMINATION: General: chronically ill, cachetic appearing male in NAD, lying in bed HEENT: MM pink/dry, no jvd, cortrak to left nare Neuro: Awake, alert, oriented to self, follows commands, weakly MAE CV: s1s2 rrr, no murmur PULM: even/non-labored, clear, diminished in bases, on RA at 100% WU:JWJX, NT , BS +  Extremities: warm/dry, no edema  Skin: no rashes or lesions  LABS:  BMET  Recent Labs Lab 01/30/17 0608 01/31/17 0014 02/01/17 0201  NA 151* 151* 150*  K 3.0* 3.8 2.9*  CL 110 110 110  CO2 _1 BUN 19 21* 21*  CREATININE 1.41*  1.53* 1.70*  GLUCOSE 110* 144* 101*    Electrolytes  Recent Labs Lab 01/27/17 0301 01/28/17 0307  01/30/17 0608 01/31/17 0014 02/01/17 0201 02/01/17 0832  CALCIUM 7.6* 7.7*  < > 8.0* 8.1* 7.8*  --   MG 2.0 2.0  --   --   --   --  1.7  PHOS 3.6 3.8  --   --   --   --  3.3  < > = values in this interval not displayed.  CBC  Recent Labs Lab 01/30/17 0608 01/31/17 0014 02/01/17 0201  WBC 6.0 6.3 8.2  HGB 8.6* 8.7* 8.3*  HCT 27.7* 28.0* 27.1*  PLT 249 219 236    Coag's No results for input(s): APTT, INR in the last 168 hours.  Sepsis Markers  Recent Labs Lab 01/26/17 0206  PROCALCITON 1.23    ABG  Recent Labs Lab 01/28/17 2110  PHART 7.424  PCO2ART 34.9  PO2ART 59.3*    Liver Enzymes  Recent Labs Lab 01/26/17 0206 01/27/17 0301 01/28/17 0307  ALBUMIN 1.3* 1.4* 1.4*    Cardiac Enzymes  Recent Labs Lab 01/30/17 1827 01/31/17 0014 02/01/17 0201  TROPONINI 2.92* 3.45* 3.48*    Glucose  Recent Labs Lab 01/31/17 1543 01/31/17 1653 01/31/17 2024 02/01/17 0030 02/01/17 0413 02/01/17 0741  GLUCAP 53* 104* 105* 96 107* 101*    Imaging No results found. STUDIES:  CT CHEST W/ CONTRAST 7/29: IMPRESSION: 1. Significantly improved right-sided empyema. Small  pockets of residual fluid are seen along the lateral chest wall and fissures. Pneumothorax is small and stable from chest x-ray earlier today. 2. Pneumonia and atelectasis on the right, greatest in the lower lobe. 3. Diminished concern for obstructing airway lesion when compared to admission CT, when there was bronchial distortion due to the pleural effusion. Recommend three-month follow-up CT in this smoker with nonspecific adenopathy. 4. Small upper abdominal ascites that is newly seen. 5. Aortic Atherosclerosis (ICD10-I70.0) and Emphysema (ICD10-J43.9). 6. 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 PORT CXR 7/30:  Minimal residual right pleural effusion.  Minimal residual right pneumothorax. Right-sided chest tube directed caudally. PORT CXR 7/31:  Small right apical pneumothorax. No appreciable change compared with chest x-ray imaging from last evening. No new focal opacity appreciated. No clear evolution of or development of right pleural effusion. CT CHEST W/ CONTRAST 8/4: IMPRESSION: 1. Large complex collections of fluid and air throughout the right chest, locations and measurements given above, largest collection along the posterior right chest measuring over 20 cm greatest dimension, presumed empyemas. These findings represent a significant worsening compared to the earlier chest CT of 01/16/2017. These collections appear to be contiguous but are also likely multiloculated to some degree. Right-sided chest tube has been removed in the interval. 2. Moderate-sized left pleural effusion. 3. Mediastinal and perihilar lymphadenopathy is likely reactive in nature. 4. Upper abdominal ascites, incompletely imaged. PORT CXR 8/5:   Right-sided chest tube in good position. Endotracheal tube in good position. Enteric feeding tube coursing below diaphragm. Right lung opacities persistent. CT CHEST W/O 8/7: IMPRESSION: 1. Multifactorial degradation, as detailed above. 2. Similar appearance and configuration of multiple loculated right-sided pleural fluid collections, most consistent with empyemas. Small foci of pleural gas are not significantly changed.  3. Placement of a right-sided large-bore chest tube since the prior CT. The chest tube is surrounded by pulmonary parenchyma, and is likely not sufficiently draining the pleural space. 4.  Emphysema (ICD10-J43.9). 5. Coronary artery atherosclerosis. Aortic Atherosclerosis (ICD10-I70.0). 6. Similar small left pleural effusion. 7. Upper abdominal ascites. PORT CXR 8/8:  Enteric feeding tube coursing below diaphragm. Endotracheal tube in good position. Right chest tube in good position. Slight improvement in  right lung patchy opacification with residual pleural effusion still present. PORT CXR 8/8:  Endotracheal and enteric feeding tubes removed. Right chest tube removed. Lordotic view with slight rotation to the right. Increased opacity right lung with residual effusion.   MICROBIOLOGY: MRSA PCR 7/24:  Negative Blood Cultures x2 7/24:  Negative  Right BAL 7/24:  Multiple Organisms Right Pleural Fluid culture 7/24:  Viridans streptococci  MRSA PCR 8/1:  Negative Blood Cultures x2 8/3: negative Urine Culture 8/3:  Negative  MRSA PCR 8/4:  Negative  Tracheal Aspirate Culture 8/6:  Negative  Urine Culture 8/6:  Negative  Blood Cultures x2 8/6:  negative Blood Cultures 8/10 >> GPC >>  ANTIBIOTICS: Vancomycin 7/24 - 7/25 Zosyn 7/24 - 7/28 Ancef 7/28 - 8/3 Vancomycin 8/3  - 8/8  Zosyn 8/3 >>>  SIGNIFICANT EVENTS: 7/23 - admit to [facility]MTS 7/24 - rapid response, intubated, bronchoscopy, R chest tube placed with ~3L milky yellow pus drained 7/29 - chest tube to water seal w/ small residual pneumothorax 7/30 - chest tube removed w/ small residual right pneumothorax 8/03 - discharged & returned promptly  8/05 - self-extubated & was reintubated w/ 7.5 ETT from 8.0 ETT 8/06 - febrile >> recultured  8/08 - self-extubated despite restraints. Chest tube removed.  8/10 -  on floor, no distress  LINES/TUBES: OETT 7.5 8/5 - 8/8 (self extubated twice now) OGT 8/5 - 8/8 R CHEST TUBE 8/5 - 8/8 PIV  ASSESSMENT / PLAN:  PULMONARY A: Acute Hypoxic Respiratory Failure:  Self-extubated x2 - last 8/9  Hemoptysis - occurred with intrapleural lytic therapy.  Resolved.  Right Empyema - S/P removal of chest tube 8/8. Apnea - No respiratory effort. Suspect secondary to Fentanyl drip. - resolved Severe Aspiration Risk  -previously intolerant of intrapleural lytic therapy with hemoptysis  P:   Follow intermittent CXR   Supplemental O2 PRN sats >92% Pulmonary hygiene - IS, mobilize as able Remains NPO with  cortrak, SLP following- severe aspiration risk  Awaiting recs from CT surgery, anticipate he is not a VATS candiate Appreciate PMT assistance  INFECTIOUS A:   Streptococcus Pneumonia w/ Empyema  Sepsis - high risk for recurrent infections given chronic aspiration and severe debilitation P:   Follow cultures  Continue Zosyn- for 14 days, stop date 8/18 Monitor fever curve/ WBC   FAMILY  - Updates: No family at bedside.  PMT following and family meeting scheduled for 8/14 at 1530 to discuss further [place].      Kennieth Rad, AGACNP-BC [facility] Pgr: [phone] or if no answer [phone] 02/01/2017, 10:08 AM  Attending Note:  I have examined patient, reviewed labs, studies and notes. I have discussed the case with B Simpson, and I agree with the data and plans as amended above. No significant changes since yesterday. He did have gastric tube placed for nutrition, failed swallowing eval. 71 yo man with chronic aspiration, R PNA and complicated R pleural space, loculated empyema. Poor candidate for intrapleural meds or VATS - would defer both. Treat for 14 days w abx and then follow clinically. Unfortunately low likelihood that the empyema will be fully treated, will likely have another decompensation. Note palliative care meeting today. I support this. Please call if we can help.    [name], MD, PhD 02/01/2017, 1:57 PM [facility] [phone] or if no answer [phone]

## 2017-02-01 NOTE — Progress Notes (Signed)
MD called back and notified of rhythm and VS and also that patient looks and sounds much better, no orders received at this time, will continue to monitor.

## 2017-02-01 NOTE — Progress Notes (Signed)
Pharmacy Antibiotic Note  Jay Stephens is a [age] y.o. male with strep pneumonia w/ R empyema. Pharmacy  consulted for zosyn dosing (day 11 therapy) Plans noted for at least 14 days treatment.  -WBC= 8.2, afebrile, CrCl ~ 30 -8/10 blood cultures with GPC/clusters x1: spoke to MD and no changes planned  Plan: Continue Zosyn 3.375 grams IV Q8h Will follow renal function and clinical progress  Height: 5\' 9"  (175.3 cm) Weight: 116 lb (52.6 kg) IBW/kg (Calculated) : 70.7  Temp (24hrs), Avg:98.2 F (36.8 C), Min:97.8 F (36.6 C), Max:98.7 F (37.1 C)   Recent Labs Lab 01/26/17 0930  01/28/17 0307 01/29/17 0355 01/29/17 1055 01/30/17 0608 01/31/17 0014 02/01/17 0201  WBC  --   < > 4.7 4.1 5.0 6.0 6.3 8.2  CREATININE  --   < > 1.28* 1.37*  --  1.41* 1.53* 1.70*  VANCOTROUGH 15  --   --   --   --   --   --   --   < > = values in this interval not displayed.  Estimated Creatinine Clearance: 30.5 mL/min (A) (by C-G formula based on SCr of 1.7 mg/dL (H)).    No Known Allergies  Antimicrobials this admission: Zosyn 08/03>> Vancomycin 08/03 >>8/8  Microbiology results: 8/10 blood ntd (BCID w/ GPC/clusters) Urine cx 08/06>> Neg Blood cx 08/06>> Neg Resp cx 08/06>> Neg Urine cx 08/03>> no growth; final Blood cx 08/03>> nf MRSA PCR >> negative  Jay Germanndrew Karl Stephens, Pharm D 02/01/2017 9:09 AM

## 2017-02-01 NOTE — Progress Notes (Signed)
Pt pulled out cortrak tube. Tube feeding was stopped. MD notified.

## 2017-02-01 NOTE — Progress Notes (Signed)
Family Medicine Teaching Service Daily Progress Note Intern Pager: [phone]  Patient name: Jay Stephens Medical record number: [id number] Date of birth: Dec 05, 1945 Age: 71 y.o. Gender: male  Primary Care Provider: System, Pcp Not In Consultants: palliative, ccm, cardio, nutrition Code Status: FULL  Pt Overview and Major Events to Date:  Jay Stephens a 32 y.o.malewith PMH is significant for right sided empyema s/p drainage via chest tube, altered mental status, R facial droop, tooth abscess s/p extraction and I&D, protein-calorie malnutrition, electrolyte abnormalities and generalized weakness admitted with acute resp failure from Strep pneumonia and re accumulation of R empyema. Intubated on 8/5 - 8/9. Rapid response 8/10 for resp. distress w/CCM and Cards called, no longer in respiratory distress.   High troponins from 8/11-8/14 with oxygen demand and increased altered mental status.  Cortrack placed 8/13.  PLAN: Pulmonary: Dyspnea secondary to strep pneumonia w/ R empyema:Tachypmic overnight in 20s. On 5L nasal canula during exam. Afebrile and no leukocytosis. Blood cx drawn on 8/10 NGTD.  -CCM following, appreciate their assistance -patient has self-extubated twice during prior ICU stay -O2 as needed for O2 <92% -continue zosyn -continuous pulse ox -dailyCBC, BMP -NPO- risk for aspiration -vitals per unit routine - diurese as necessary for fluid overload  Cardiac: Elevated troponin/ST changes/NSTEMI:: Troponin 3.9<3.69<2.92<3.45<3.48 from 8/12 to AM 8/13. ECG showed ST changes. Evaluated by cardiology, determined to be likely due to hypoxia from respiratory issues and anemia. Troponin 3.9 this morning. Denies chest pain or shortness of breath -call cardiology if new symptoms develop, but patient is not a candidate for procedural intervention -daily troponins  HFpEF: 01/12/17 Echo EF55%to 60% with G2DD. Has 1+ pitting edema in ankles. 2 L UO over last 24 hours. 800 output  total on 8/12 - lasix - Continue strict I/Os and daily weights  GI Aspiration risk and NPO: Patient went for modified barium swallow and recommendations are nothing by mouth secondary to severe aspiration risk -NPO  -Cortrak to be placed today  Protein-calorie malnutrition: Patient is cachectic, which is likely due to a chronic illness such as cancer as well as his low function at baseline. Nephew reported earlier that he has to be fed at home. Albumin on admission is 1.4. Seen by nutrition and palliative care during his recent hospitalization. Is NPO for now as he failed swallow evaluation. Cortrak attempt was placed on 8/11, failed.  -monitor Mag, Phos, K daily  -continue to follow nutrition and palliative care recs - D50 bolus as needed   Renal Electrolyte abnormalities: Hypokalemia resolved, K of 2.9 this morning -ordered 60 mEq  KCl IV   Mag -add on ordered 8/14  Phos -add on ordered 8/14  AKI: Cr 1.7 this morningm, trending up for 5 days from 1.28. Likely pre renal etiology - Daily BMP - Attempt to avoid nephrotoxic agents -trying to balance respiratory/renal  Foley catheter in Place  Infectious:  Tooth abscess s/p extraction and I&D:Patient denied pain in mouth. No obvious bleeding.Since the extraction occurred so recently (on 8/1), will need to be monitored for any infection or bleeding. -continue to monitor daily -chlorhexadine mouthwash TID until 8/15  Endocrine Hypoglycemia: Glucose continues to dip occasionally into the 50's. Not being fed. He has very little reserve. Glucose 110 this AM -bolus of d50 as needed -custom SSI to reduce insulin administrion  Social/Disposition Goals of Care: Family wants full code for now. Patient only oriented to self today.  -Palliative arranged conference call @3 :30pm 8/14, meet at patient's room - Palliative consulted, appreciate their  assistance  Hematological Anemia: currently 8.7Hgb. Transfusion threshold  <8 -Received 1 unit p RBCs for hgb to 7.5 on 8/11. Hgb A1C 8.6 after transfusion.  - Daily CBC  Neurological AMS with R-sided facial droop and weakness:Patient is alert but only oriented to person, seems to be. at baseline for this visit based off previous notes - Continue to monitor   IV access: Patient has pulled multiple pivs and has been difficult to place new ones, consider PIC?  Pain control: -tylenol prn, can do rectal if needed  Fall risk: patient was found on the floor by his bed x 2 since being transferred out of ICU. -sitter ordered but nursing informed us that no sitters available, tele-sitter ordered  Dermatologic:  Sacral ulcer: -foam/turning Q2 per nursing protocol   FEN/GI:NPO Prophylaxis:Lovenox  Disposition:Potential LTAC placement by CM, when medically stable but patient has many medical issues that need to be addressed  Subjective:  Patient asking for a glass of water, oriented only to person, said not in pain  Objective: Temp:  [97.6 F (36.4 C)-98.7 F (37.1 C)] 98.7 F (37.1 C) (08/14 0011) Pulse Rate:  [87-95] 87 (08/14 0414) Resp:  [13-27] 13 (08/14 0414) BP: (128-144)/(64-101) 138/64 (08/14 0414) SpO2:  [98 %-100 %] 100 % (08/14 0217) Weight:  [116 lb (52.6 kg)] 116 lb (52.6 kg) (08/14 0414) Physical Exam: General: frail, laying in bed, in NAD Eyes: EOMI, right eye somewhat laterally deviates ENTM: dry mucous membranes, dry lips Cardiovascular: RRR, systolic murmur, 1+ pitting edema in bilateral ankles Respiratory: On 5L nasal canula, tachpynicmid 20s, decreasing lung sounds on right side anteriorly compared to left. Left lung sounds clear on upper lung but rhonchi on lower left lung.  Breath not audibly raspy from foot of bed today Gastrointestinal: +bowel sounds, nontender/soft belly MSK: thin extremities Neuro: alert and oriented to self only   Laboratory:  Recent Labs Lab 01/30/17 0608 01/31/17 0014 02/01/17 0201  WBC  6.0 6.3 8.2  HGB 8.6* 8.7* 8.3*  HCT 27.7* 28.0* 27.1*  PLT 249 219 236    Recent Labs Lab 01/30/17 0608 01/31/17 0014 02/01/17 0201  NA 151* 151* 150*  K 3.0* 3.8 2.9*  CL 110 110 110  CO2 31 26 30   BUN 19 21* 21*  CREATININE 1.41* 1.53* 1.70*  CALCIUM 8.0* 8.1* 7.8*  GLUCOSE 110* 144* 101*      Imaging/Diagnostic Tests: Ct Chest W Contrast  Result Date: 01/22/2017 CLINICAL DATA:  Status post chest tube for right empyema. EXAM: CT CHEST WITH CONTRAST TECHNIQUE: Multidetector CT imaging of the chest was performed during intravenous contrast administration. CONTRAST:  75mL ISOVUE-300 IOPAMIDOL (ISOVUE-300) INJECTION 61% COMPARISON:  Chest CT dated 01/16/2017. Chest x-ray from earlier same day. FINDINGS: Cardiovascular: Heart size is normal. No pericardial effusion. Coronary artery calcifications. No thoracic aortic aneurysm or dissection. Mediastinum/Nodes: Scattered small and moderate-sized lymph nodes within the mediastinum and perihilar regions, likely reactive in nature. Trachea and central bronchi are unremarkable. Lungs/Pleura: Large complex collections are seen throughout the right chest, major components probably contiguous but also likely multiloculated to some degree. Dominant collection of fluid and air along the posterior aspects of the right upper lobe and right lower lobe measures over 23 cm craniocaudal dimension (series 7, image 66) and approximately 12 x 7 cm transverse by AP dimensions near the right lung base (series 7, image 61; series 3, image 126). The posterior collection appears contiguous with an additional large complex collection of fluid and air at the right lung  apex (probable connection seen on series 3, image 52). The complex collection in the right lung apex measures 14 cm craniocaudal dimension and 5 cm thickness (series 7, image 54; series 3, image 63). Lastly, additional complex fluid collection is seen along the right lateral chest wall measuring 9 cm AP  dimension and 2.5 cm thickness (series 3, image 80). This collection does appear contiguous with the collection at the right lung apex. Moderate-size layering pleural effusion on the left with adjacent compressive atelectasis. Upper Abdomen: Free fluid/ascites within the upper abdomen, at least a moderate amount, incompletely imaged. Musculoskeletal: No acute or suspicious osseous finding. Right-sided chest tube seen on CT of 01/16/2017 has been removed. IMPRESSION: 1. Large complex collections of fluid and air throughout the right chest, locations and measurements given above, largest collection along the posterior right chest measuring over 20 cm greatest dimension, presumed empyemas. These findings represent a significant worsening compared to the earlier chest CT of 01/16/2017. These collections appear to be contiguous but are also likely multiloculated to some degree. Right-sided chest tube has been removed in the interval. 2. Moderate-sized left pleural effusion. 3. Mediastinal and perihilar lymphadenopathy is likely reactive in nature. 4. Upper abdominal ascites, incompletely imaged. These results were called by telephone at the time of interpretation on 01/22/2017 at 11:00 am to Dr. Acquanetta Belling , who verbally acknowledged these results. Electronically Signed   By: Bary Richard M.D.   On: 01/22/2017 11:17   Dg Chest Port 1 View  Result Date: 01/22/2017 CLINICAL DATA:  Empyema.  Status post chest tube placement. EXAM: PORTABLE CHEST 1 VIEW COMPARISON:  CT chest 0 8/0 scratch the CT chest earlier today. Single-view of the chest 02/14/2017. FINDINGS: A new right chest tube is in place. Loculated pleural fluid collections in the right chest do not appear changed. Airspace disease in the right lung is also not notably changed. The left lung is expanded and clear. Left pleural effusion is noted. IMPRESSION: No marked change in loculated right pleural effusions and airspace disease with a new chest tube in  place. Negative for pneumothorax. Small left pleural effusion seen on prior CT scan is not well demonstrated on this exam. Electronically Signed   By: Drusilla Kanner M.D.   On: 01/22/2017 15:51   Dg Chest Portable 1 View  Result Date: 01/20/2017 CLINICAL DATA:  Shortness of breath.  History of empyema. EXAM: PORTABLE CHEST 1 VIEW COMPARISON:  PA and lateral chest 01-25-17 and 01/18/2017. CT chest 01/16/2017. FINDINGS: Pleural effusion which appears loculated along the right chest wall is again seen. Right basilar effusion is new since the most recent examination. There is new airspace disease throughout the right chest. The left lung is clear. Heart size is normal. IMPRESSION: New airspace disease throughout the right chest with a new right basilar effusion worrisome for pneumonia. Loculated right pleural effusion does not appear notably changed since the most recent exam. Electronically Signed   By: Drusilla Kanner M.D.   On: 02/09/2017 20:03     Marthenia Rolling, DO 02/01/2017, 6:16 AM PGY-1, [facility] FPTS Intern pager: [phone], text pages welcome

## 2017-02-01 NOTE — Progress Notes (Signed)
Daily Progress Note   Patient Name: Jay Stephens       Date: 02/01/2017 DOB: 1946/03/20  Age: 71 y.o. MRN#: [id number] Attending Physician: Leeanne Rio, MD Primary Care Physician: System, Pcp Not In Admit Date: 02/05/2017  Reason for Consultation/Follow-up: Establishing goals of care   Subjective: I met this afternoon with Mr. Trulson. He is confused but pleasant.    Length of Stay: 11  Current Medications: Scheduled Meds:  . chlorhexidine      . chlorhexidine gluconate (MEDLINE KIT)  15 mL Mouth Rinse BID  . heparin subcutaneous  5,000 Units Subcutaneous Q8H  . insulin aspart  1-2 Units Subcutaneous TID WC  . ipratropium-albuterol  3 mL Nebulization BID  . mouth rinse  15 mL Mouth Rinse QID    Continuous Infusions: . feeding supplement (JEVITY 1.2 CAL) Stopped (02/01/17 1418)  . piperacillin-tazobactam (ZOSYN)  IV 3.375 g (02/01/17 1416)    PRN Meds: acetaminophen, albuterol, RESOURCE THICKENUP CLEAR  Physical Exam  Constitutional:  Frail cachectic older man; in no acute distress. Pleasantly confused  HENT:  Head: Normocephalic and atraumatic.  Neck: Normal range of motion.  Cardiovascular:  Warm and dry  Pulmonary/Chest: Effort normal. No respiratory distress.  Abdominal: Soft. He exhibits no distension.  Musculoskeletal: Normal range of motion.  Neurological: He is alert.  Oriented to himself Recognizes his family Doesn't understand his clinical condition  Psychiatric: He has a normal mood and affect.  Patient is confused; having difficulty with hospital procedures such as he pulled out his IV today because it burned  Nursing note and vitals reviewed.           Vital Signs: BP 124/88 (BP Location: Right Arm)   Pulse 93   Temp (!) 97.5 F (36.4 C) (Oral)    Resp (!) 22   Ht _0  (1.753 m)   Wt 52.6 kg (116 lb)   SpO2 100%   BMI 17.13 kg/m  SpO2: SpO2: 100 % O2 Device: O2 Device: Not Delivered O2 Flow Rate: O2 Flow Rate (L/min): 2 L/min  Intake/output summary:   Intake/Output Summary (Last 24 hours) at 02/01/17 1702 Last data filed at 02/01/17 1600  Gross per 24 hour  Intake          1533.83 ml  Output  950 ml  Net           583.83 ml   LBM: Last BM Date: 01/29/17 Baseline Weight: Weight: 64 kg (141 lb 1.5 oz) Most recent weight: Weight: 52.6 kg (116 lb)       Palliative Assessment/Data: 30%    Flowsheet Rows     Most Recent Value  Intake Tab  Referral Department  Hospitalist  Unit at Time of Referral  Med/Surg Unit  Palliative Care Primary Diagnosis  Sepsis/Infectious Disease  Date Notified  01/27/17  Palliative Care Type  Return patient Palliative Care  Reason for referral  Clarify Goals of Care, Psychosocial or Spiritual support  Date of Admission  01/20/2017  Date first seen by Palliative Care  01/28/17  # of days Palliative referral response time  1 Day(s)  # of days IP prior to Palliative referral  6  Clinical Assessment  Palliative Performance Scale Score  30%  Pain Max last 24 hours  Not able to report  Pain Min Last 24 hours  Not able to report  Dyspnea Max Last 24 Hours  Not able to report  Dyspnea Min Last 24 hours  Not able to report  Nausea Max Last 24 Hours  Not able to report  Nausea Min Last 24 Hours  Not able to report  Anxiety Max Last 24 Hours  Not able to report  Anxiety Min Last 24 Hours  Not able to report  Other Max Last 24 Hours  Not able to report  Psychosocial & Spiritual Assessment  Palliative Care Outcomes  Patient/Family meeting held?  No  Who was at the meeting?  scheduled for 11am 8/11  Palliative Care Outcomes  Provided psychosocial or spiritual support  Palliative Care follow-up planned  Yes, Facility      Patient Active Problem List   Diagnosis Date Noted  .  Palliative care by specialist   . Obtundation 01/22/2017  . Aspiration pneumonia ([place]) 01/22/2017  . Ascites 01/22/2017  . Emphysema lung ([place]) 01/22/2017  . Aortic atherosclerosis ([place]) 01/22/2017  . Echocardiogram shows left ventricular diastolic dysfunction, Grade Two 01/22/2017  . Respiratory distress 01/22/2017  . Pressure ulcer of sacrum 01/27/2017  . Pleural effusion on left   . Pneumothorax on right   . Palliative care encounter   . Protein-calorie malnutrition, severe ([place]) 01/11/2017  . Acute respiratory failure with hypoxia ([place])   . Empyema ([place])   . 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 Altered mental status 12/21/2016    Palliative Care Assessment & Plan   Patient Profile:  71 y.o.malewith PMH is significant for right sided empyema s/p drainage via chest tube, altered mental status, R facial droop, tooth abscess s/p extraction and I&D, protein-calorie malnutrition, electrolyte abnormalities and generalized weakness admitted with acute resp failure from Strep pneumonia and re accumulation of R empyema. Intubated on 8/5 - 8/9. Rapid response 8/10 for resp. distress w/CCM and has failed chest tube therapy and has been on antibiotics, not a candidate for cardiothoracic surgery.   Myself, Crystal NP, and Dr. Burr Medico spoke with nephew/NOK Trilby Drummer over the phone. Trilby Drummer is struggling with his uncles decline but is able to admit that he too believes that Mr. Cappelletti is approaching the end of his life. With this understanding I discussed further with Trilby Drummer my recommendation to consider DNR and transition to hospice care. Trilby Drummer became very quiet and tearful. He says that he wishes to discuss this with his uncle himself (we did remind him that unfortunately  with Mr. Franzen confusion he is unlikely to understand but respect his desire to try and discuss). Trilby Drummer says he plans on speaking further with the rest of his family today and we will speak again tomorrow ~3:30 pm over the phone to  follow up on our conversation.   Recommendations/Plan:  Family considering hospice/comfort care. They are struggling with decisions.   Goals of Care and Additional Recommendations:  Limitations on Scope of Treatment: Full Scope Treatment  Code Status:    Code Status Orders        Start     Ordered   01/22/17 0203  Full code  Continuous     01/22/17 0202    Code Status History    Date Active Date Inactive Code Status Order ID Comments User Context   12/29/2016  9:48 PM 02/15/2017  7:19 PM Full Code [id number]  Carlyle Dolly, MD Inpatient       Prognosis:   Unable to determine  Discharge Planning:  To Be Determined  Care plan was discussed with Intern with Family Medicine Service.   Thank you for allowing the Palliative Medicine Team to assist in the care of this patient.   Total Time 34mn Prolonged Time Billed  no       Greater than 50%  of this time was spent counseling and coordinating care related to the above assessment and plan.  PPershing Proud NP   AVinie Sill NP Palliative Medicine Team Pager # [phone](M-F 8a-5p) Team Phone # [phone](Nights/Weekends)  Please contact Palliative Medicine Team phone at [phone] questions and concerns.

## 2017-02-01 NOTE — Care Management Note (Addendum)
Case Management Note  Patient Details  Name: Ferd HibbsJessie Lares MRN: [id number] Date of Birth: 05/14/1946  Subjective/Objective: [facility] Pt with loculated effusions- Chest tube placed/ removed 01-26-17. Acute Hypoxic Respiratory Failure- intubated and self extubated 01-27-17. Pt failed Swallow Evaluation and Cortrak placed 01-31-17. Pt continues on IV Zosyn. Pt was discussed in the [facility]OS meeting in regards to LTAC- pt is appropriate for LTAC- CM did [facility] today and have Palliative Care follow at the Facility.                      Action/Plan: Palliative Care Meeting scheduled for 3:30 with the Nephew. [facility] received [facility] call back in regards to LTAC recommendations/ thoughts. MD appreciative of information about LTAC- could be discussed in the Palliative Care Meeting as well. Unsure of outcome of meeting- may need Hospice Services- however other options available. Will continue to follow up.   Expected Discharge Date:                  Expected Discharge Plan:  Long Term Acute Care (LTAC)  In-House Referral:  Clinical Social Work  Discharge planning Services  CM Consult  Post Acute Care Choice:  Long Term Acute Care (LTAC) Choice offered to:    DME Arranged:  N/A DME Agency:  NA  HH Arranged:  NA HH Agency:  NA  Status of Service:     If discussed at Long Length of Stay Meetings, dates discussed:  02-01-17, 02-03-17  Additional Comments: 1440 02-04-17 Tomi BambergerBrenda Graves-Bigelow, RN,BSN [phone] CM did speak with MD Parke SimmersBland in regards to disposition for the patient. Ethics did speak with MD Parke SimmersBland and note to follow from physician. Per Dr. Armanda MagicBland Nephew to speak with Dr. Leveda AnnaHensel on Friday. Cortrak remains out and IVF continues. Pt is a Full Code.    1206 02-03-17 Tomi BambergerBrenda Graves-Bigelow, RN,BSN [phone] CM did discuss the pt in [facility]OS Meeting this  am. Recommendations from PT for LTAC- CM did speak with MD 2 days ago in regards to LTAC. CM called and spoke [facility] @ 1225 [facility] and recommendations from PT- Ethics has been consulted. Awaiting notes or any information in regards to Ethics Meeting. MD to speak with family in regards to LTAC to see if agreeable vs if pt needs Hospice Facility.  [facility] willing to take the patient- and they can assist with the Palliative Conversation as well. Cortrak remains out and pt has IVF infusing.CM will continue to monitor for additional needs.   Gala LewandowskyGraves-Bigelow, Nehemie Casserly Kaye, RN 02/01/2017, 12:03 PM

## 2017-02-01 NOTE — Care Management Important Message (Signed)
Important Message  Patient Details  Name: Jay HibbsJessie Hoefle MRN: [id number] Date of Birth: 09/14/1945   Medicare Important Message Given:  Yes    [name], [name] 02/01/2017, 10:35 AM

## 2017-02-02 DIAGNOSIS — R1310 Dysphagia, unspecified: Secondary | ICD-10-CM

## 2017-02-02 DIAGNOSIS — Z7189 Other specified counseling: Secondary | ICD-10-CM

## 2017-02-02 LAB — BASIC METABOLIC PANEL
Anion gap: 15 (ref 5–15)
BUN: 27 mg/dL — AB (ref 6–20)
CHLORIDE: 109 mmol/L (ref 101–111)
CO2: 27 mmol/L (ref 22–32)
CREATININE: 1.77 mg/dL — AB (ref 0.61–1.24)
Calcium: 8.3 mg/dL — ABNORMAL LOW (ref 8.9–10.3)
GFR calc Af Amer: 43 mL/min — ABNORMAL LOW (ref >60)
GFR calc non Af Amer: 37 mL/min — ABNORMAL LOW (ref >60)
Glucose, Bld: 117 mg/dL — ABNORMAL HIGH (ref 65–99)
Potassium: 4.1 mmol/L (ref 3.5–5.1)
SODIUM: 151 mmol/L — AB (ref 135–145)

## 2017-02-02 LAB — GLUCOSE, CAPILLARY
GLUCOSE-CAPILLARY: 107 mg/dL — AB (ref 65–99)
GLUCOSE-CAPILLARY: 113 mg/dL — AB (ref 65–99)
Glucose-Capillary: 123 mg/dL — ABNORMAL HIGH (ref 65–99)
Glucose-Capillary: 100 mg/dL — ABNORMAL HIGH (ref 65–99)
Glucose-Capillary: 111 mg/dL — ABNORMAL HIGH (ref 65–99)
Glucose-Capillary: 113 mg/dL — ABNORMAL HIGH (ref 65–99)

## 2017-02-02 LAB — CBC
HCT: 32 % — ABNORMAL LOW (ref 39.0–52.0)
HEMOGLOBIN: 9.8 g/dL — AB (ref 13.0–17.0)
MCH: 28 pg (ref 26.0–34.0)
MCHC: 30.6 g/dL (ref 30.0–36.0)
MCV: 91.4 fL (ref 78.0–100.0)
Platelets: 229 10*3/uL (ref 150–400)
RBC: 3.5 MIL/uL — ABNORMAL LOW (ref 4.22–5.81)
RDW: 19.6 % — ABNORMAL HIGH (ref 11.5–15.5)
WBC: 6.7 10*3/uL (ref 4.0–10.5)

## 2017-02-02 LAB — TROPONIN I: Troponin I: 2.05 ng/mL (ref <0.03)

## 2017-02-02 LAB — CULTURE, BLOOD (SINGLE): SPECIAL REQUESTS: ADEQUATE

## 2017-02-02 LAB — MAGNESIUM: MAGNESIUM: 1.8 mg/dL (ref 1.7–2.4)

## 2017-02-02 LAB — PHOSPHORUS: Phosphorus: 3.6 mg/dL (ref 2.5–4.6)

## 2017-02-02 MED ORDER — DEXTROSE-NACL 5-0.9 % IV SOLN
INTRAVENOUS | Status: DC
  Administered 2017-02-02: 20:00:00 via INTRAVENOUS

## 2017-02-02 NOTE — Progress Notes (Signed)
Cortrak Tube Team Note:  Consult received to place a Cortrak feeding tube.   Pt pulled out Cortrak tube 8/14. Consulted for replacement. Spoke with RN, Steward DroneBrenda who was unsure if family still wanted placement after care meeting on 8/14. Spoke with resident who suggested to place tube this afternoon per family wishes. Pt refused upon explaining the procedure. Family to have another goals of care meeting this afternoon around 3 pm. Will monitor for goals of care decision.   Please re-consult Cortrak Team if tube feeding placement is warranted as current consult has been canceled for pending care goals.   Jay Stephens RD, LDN Clinical Nutrition Pager # [phone]- [phone]

## 2017-02-02 NOTE — Progress Notes (Signed)
Physical Therapy Treatment Patient Details Name: Jay Stephens MRN: [id number] DOB: [birth year] Today's Date: 02/02/2017    History of Present Illness 71 year old readmitted from SNF with AMS and dyspnea on 8/3, the same day he was discharged. Required intubation with self extubation both 8/5 and 8/8 and chest tube discontinued 8/4. PMH: recent aspiration PNA with empyema requiring chest tube and prolonged hospitalization, protein calorie malnutrition, HTN, diastolic heart failure.    PT Comments    Pt continues to require significant assist for all mobility. Pt becoming irritable when asked to attempt standing for a second time. Pt returned to supine at that time. Repeatedly ask for water and doesn't remember or understand why he is NPO.   Follow Up Recommendations  LTACH     Equipment Recommendations  Other (comment) (To be determined at next venue)    Recommendations for Other Services       Precautions / Restrictions Precautions Precautions: Fall Restrictions Weight Bearing Restrictions: No    Mobility  Bed Mobility Overal bed mobility: Needs Assistance Bed Mobility: Supine to Sit;Sit to Sidelying     Supine to sit: +2 for physical assistance;Mod assist Sit to supine: +2 for physical assistance;Min assist   General bed mobility comments: Assist to bring legs off of bed, elevate trunk into sitting, and bring hips to EOB. Assist to lower trunk and bring feet up into bed  Transfers Overall transfer level: Needs assistance Equipment used: Rolling walker (2 wheeled) Transfers: Sit to/from Stand Sit to Stand: +2 physical assistance;Mod assist         General transfer comment: Assist to bring hips up and for balance. LLE knee in flexion despite manual facilitation to extend. Pt refused to attempt further standing after first time.  Ambulation/Gait                 Stairs            Wheelchair Mobility    Modified Rankin (Stroke Patients Only)        Balance Overall balance assessment: Needs assistance Sitting-balance support: Bilateral upper extremity supported Sitting balance-Leahy Scale: Poor Sitting balance - Comments: UE support and min A for static standing   Standing balance support: Bilateral upper extremity supported Standing balance-Leahy Scale: Poor Standing balance comment: walker and mod assist to stand. Pt with lt knee in flexion. Unable to fully extend hips and trunk.Pt leaning lt.                             Cognition Arousal/Alertness: Awake/alert Behavior During Therapy: Flat affect Overall Cognitive Status: Impaired/Different from baseline Area of Impairment: Safety/judgement;Following commands;Problem solving;Memory;Orientation;Attention                 Orientation Level: Disoriented to;Place;Time;Situation Current Attention Level: Sustained Memory: Decreased short-term memory;Decreased recall of precautions Following Commands: Follows one step commands with increased time Safety/Judgement: Decreased awareness of safety;Decreased awareness of deficits   Problem Solving: Slow processing;Decreased initiation;Difficulty sequencing;Requires verbal cues;Requires tactile cues        Exercises      General Comments        Pertinent Vitals/Pain Pain Assessment: No/denies pain    Home Living                      Prior Function            PT Goals (current goals can now be found in the care plan section) Progress towards  PT goals: Not progressing toward goals - comment (Poor activity tolerance)    Frequency    Min 2X/week      PT Plan Discharge plan needs to be updated    Co-evaluation PT/OT/SLP Co-Evaluation/Treatment: Yes Reason for Co-Treatment: For patient/therapist safety;Other (comment) (Pt's limited activity tolerance) PT goals addressed during session: Mobility/safety with mobility;Balance        AM-PAC PT "6 Clicks" Daily Activity  Outcome Measure   Difficulty turning over in bed (including adjusting bedclothes, sheets and blankets)?: Total Difficulty moving from lying on back to sitting on the side of the bed? : Total Difficulty sitting down on and standing up from a chair with arms (e.g., wheelchair, bedside commode, etc,.)?: Total Help needed moving to and from a bed to chair (including a wheelchair)?: Total Help needed walking in hospital room?: Total Help needed climbing 3-5 steps with a railing? : Total 6 Click Score: 6    End of Session Equipment Utilized During Treatment: Gait belt Activity Tolerance: Treatment limited secondary to agitation Patient left: in bed;with call bell/phone within reach;with bed alarm set Nurse Communication: Mobility status PT Visit Diagnosis: Unsteadiness on feet (R26.81);Other abnormalities of gait and mobility (R26.89);Muscle weakness (generalized) (M62.81);Adult, failure to thrive (R62.7)     Time: 1136-1150 PT Time Calculation (min) (ACUTE ONLY): 14 min  Charges:  $Therapeutic Activity: 8-22 mins                    G Codes:       [facility] Edris Schneck PT [phone]    Angelina OkCary W [facility] 02/02/2017, 12:39 PM

## 2017-02-02 NOTE — Plan of Care (Signed)
Problem: Fluid Volume: Goal: Ability to maintain a balanced intake and output will improve Outcome: Progressing Maintenance IV fluids initiated   Problem: Coping: Goal: Level of anxiety will decrease Outcome: Completed/Met Date Met: 02/02/17 Denies anxiety

## 2017-02-02 NOTE — Progress Notes (Signed)
    Primary cardiologist: Hochrein  Tele revewied.  NSR. Discussed with nurse.  No cardiac complaints.  Awaiting goals of care discussion with palliative care.  Please call if new cardiac issues arise.  Will sign off.  Corky CraftsVaranasi, Breigh Annett S, MD

## 2017-02-02 NOTE — Progress Notes (Signed)
Occupational Therapy Treatment Patient Details Name: Jay Stephens MRN: [id number] DOB: Nov 12, 1945 Today's Date: 02/02/2017    History of present illness 71 year old readmitted from SNF with AMS and dyspnea on 8/3, the same day he was discharged. Required intubation with self extubation both 8/5 and 8/8 and chest tube discontinued 8/4. PMH: recent aspiration PNA with empyema requiring chest tube and prolonged hospitalization, protein calorie malnutrition, HTN, diastolic heart failure.   OT comments  Seen with PT due to requiring plus 2 assist and poor activity tolerance. Pt assisted to EOB and to stand x 1. Refused further activity and began to lay back down. Continues to be appropriate for SNF.  Follow Up Recommendations  SNF;Supervision/Assistance - 24 hour    Equipment Recommendations       Recommendations for Other Services      Precautions / Restrictions Precautions Precautions: Fall Restrictions Weight Bearing Restrictions: No       Mobility Bed Mobility Overal bed mobility: Needs Assistance Bed Mobility: Supine to Sit;Sit to Sidelying     Supine to sit: +2 for physical assistance;Mod assist Sit to supine: +2 for physical assistance;Min assist   General bed mobility comments: Assist to bring legs off of bed, elevate trunk into sitting, and bring hips to EOB. Assist to lower trunk and bring feet up into bed  Transfers Overall transfer level: Needs assistance Equipment used: Rolling walker (2 wheeled) Transfers: Sit to/from Stand Sit to Stand: +2 physical assistance;Mod assist         General transfer comment: Assist to bring hips up and for balance. LLE knee in flexion despite manual facilitation to extend. Pt refused to attempt further standing after first time.    Balance Overall balance assessment: Needs assistance Sitting-balance support: Bilateral upper extremity supported Sitting balance-Leahy Scale: Poor Sitting balance - Comments: UE support and min A  for static sitting   Standing balance support: Bilateral upper extremity supported Standing balance-Leahy Scale: Poor Standing balance comment: walker and mod assist to stand. Pt with lt knee in flexion. Unable to fully extend hips and trunk.Pt leaning lt.                            ADL either performed or assessed with clinical judgement   ADL Overall ADL's : Needs assistance/impaired Eating/Feeding: NPO                   Lower Body Dressing: Total assistance;Bed level                       Vision       Perception     Praxis      Cognition Arousal/Alertness: Awake/alert Behavior During Therapy: Flat affect Overall Cognitive Status: Impaired/Different from baseline Area of Impairment: Safety/judgement;Following commands;Problem solving;Memory;Orientation;Attention                 Orientation Level: Disoriented to;Place;Time;Situation Current Attention Level: Sustained Memory: Decreased short-term memory;Decreased recall of precautions Following Commands: Follows one step commands with increased time Safety/Judgement: Decreased awareness of safety;Decreased awareness of deficits   Problem Solving: Slow processing;Decreased initiation;Difficulty sequencing;Requires verbal cues;Requires tactile cues          Exercises     Shoulder Instructions       General Comments      Pertinent Vitals/ Pain       Pain Assessment: No/denies pain  Home Living  Prior Functioning/Environment              Frequency  Min 2X/week        Progress Toward Goals  OT Goals(current goals can now be found in the care plan section)  Progress towards OT goals: Not progressing toward goals - comment (pt resistant to activity)  Acute Rehab OT Goals Patient Stated Goal: to drink water Time For Goal Achievement: 02/11/17 Potential to Achieve Goals: Fair  Plan Discharge plan remains  appropriate    Co-evaluation    PT/OT/SLP Co-Evaluation/Treatment: Yes Reason for Co-Treatment: For patient/therapist safety;Necessary to address cognition/behavior during functional activity PT goals addressed during session: Mobility/safety with mobility;Balance OT goals addressed during session: Strengthening/ROM      AM-PAC PT "6 Clicks" Daily Activity     Outcome Measure   Help from another person eating meals?: Total Help from another person taking care of personal grooming?: A Lot Help from another person toileting, which includes using toliet, bedpan, or urinal?: Total Help from another person bathing (including washing, rinsing, drying)?: A Lot Help from another person to put on and taking off regular upper body clothing?: A Little Help from another person to put on and taking off regular lower body clothing?: Total 6 Click Score: 10    End of Session Equipment Utilized During Treatment: Rolling walker;Gait belt  OT Visit Diagnosis: Unsteadiness on feet (R26.81);Muscle weakness (generalized) (M62.81);Other symptoms and signs involving cognitive function   Activity Tolerance Patient tolerated treatment well   Patient Left in bed;with call bell/phone within reach;with bed alarm set   Nurse Communication          Time: [id number] OT Time Calculation (min): 15 min  Charges: OT General Charges $OT Visit: 1 Procedure  02/02/2017 Martie RoundJulie Corwyn Vora, OTR/L Pager: [phone]   Evern BioMayberry, Kimala Horne Lynn 02/02/2017, 2:07 PM

## 2017-02-02 NOTE — Progress Notes (Signed)
Family Medicine Teaching Service Daily Progress Note Intern Pager: [phone]  Patient name: Jay Stephens Medical record number: [id number] Date of birth: August 26, 1945 Age: 71 y.o. Gender: male  Primary Care Provider: System, Pcp Not In Consultants: palliative, ccm, cardio, nutrition Code Status: FULL  Pt Overview and Major Events to Date:  Jay Stephens a 50 y.o.malewith PMH is significant for right sided empyema s/p drainage via chest tube, altered mental status, R facial droop, tooth abscess s/p extraction and I&D, protein-calorie malnutrition, electrolyte abnormalities and generalized weakness admitted with acute resp failure from Strep pneumonia and re accumulation of R empyema. Intubated on 8/5 - 8/9. Rapid response 8/10 for resp. distress w/CCM and Cards called, no longer in respiratory distress. High troponins from 8/11-8/14 with oxygen demand and increased altered mental status.  Cortrack placed 8/13 and pulled by patient on 8/14.  PLAN:    **Ethics consult pending given patient's physical resistance to feeding tube and questionable capacity** Pulmonary: Dyspnea secondary to strep pneumonia w/ R empyema:Tachypmic overnight in 30s-20s. On 5L nasal canula during exam. Afebrile and no leukocytosis. Blood cx drawn on 8/10 + gram+ cocci in clusters (pipercillin/tazobactam enough?) -CCM following, appreciate their assistance -patient has self-extubated twice during prior ICU stay -O2 as needed for O2 <92% -continue zosyn -continuous pulse ox -dailyCBC, BMP -NPO- risk for aspiration -vitals per unit routine - diurese as necessary for fluid overload  Cardiac: Elevated troponin/ST changes/NSTEMI:: Troponin 3.9<3.69<2.92<3.45<3.48<2.05 from 8/12 to AM 8/13. ECG showed ST changes. Evaluated by cardiology, determined to be likely due to hypoxia from respiratory issues and anemia. Troponin 2.05 this morning. Denies chest pain or shortness of breath -call cardiology if new symptoms  develop, but patient is not a candidate for procedural intervention -daily troponins  HFpEF: 01/12/17 Echo EF55%to 60% with G2DD. No pitting edema. 2 L UO over last 24 hours. 800 output total on 8/12 - lasix - Continue strict I/Os and daily weights  GI Aspiration risk and NPO: Patient went for modified barium swallow and recommendations are nothing by mouth secondary to severe aspiration risk -NPO  -He pulled cortrak, can possibly be replaced today.  Will hold on cortrak placement pending ethic consult  Protein-calorie malnutrition: Patient is cachectic, which is likely due to a chronic illness such as cancer as well as his low function at baseline. Nephew reported earlier that he has to be fed at home. Albumin on admission is 1.4. Seen by nutrition and palliative care during his recent hospitalization. Is NPO for now as he failed swallow evaluation. Cortrak attempt was placed on 8/11, failed.  -will need to replace feeding access but patient refuses and resisted cortrak today.  Will hold on cortrak placement pending ethic consult -monitor Mag, Phos, K daily  -continue to follow nutrition and palliative care recs - D50 bolus as needed   Renal Electrolyte abnormalities: Hypokalemia resolved, K of 4.1this morning (had rebounded to 5.3) -daily BMP  Mag 1.8 -daily checks  Phos- 3.6 -daily checks  AKI: Cr 1.77 this morningm, trending up for 5 days from 1.28. Likely pre renal etiology - Daily BMP - Attempt to avoid nephrotoxic agents -trying to balance respiratory/renal  Foley catheter in Place  Infectious:  Tooth abscess s/p extraction and I&D:Patient denied pain in mouth. No obvious bleeding.Since the extraction occurred so recently (on 8/1), will need to be monitored for any infection or bleeding. -continue to monitor daily -chlorhexadine mouthwash TID until 8/15  Endocrine Hypoglycemia: Steady low 100s overnight -bolus of d50 as needed -custom SSI to  reduce  insulin administrion  Social/Disposition Goals of Care: Family wants full code for now. Patient only oriented to self today.  -Palliative arranged conference call @3 :30pm 8/14, meet at patient's room -Palliative consulted, appreciate their assistance  Hematological Anemia: currently 8.7Hgb. Transfusion threshold <8 -Received 1 unit p RBCs for hgb to 7.5 on 8/11. Hgb A1C 8.6 after transfusion.  - Daily CBC  Neurological AMS with R-sided facial droop and weakness:Patient is alert but only oriented to person, seems to be. at baseline for this visit based off previous notes - Continue to monitor   IV access: Patient has pulled multiple pivs and has been difficult to place new ones, consider PIC?  Pain control: -tylenol prn, can do rectal if needed  Fall risk: patient was found on the floor by his bed x 2 since being transferred out of ICU. -sitter ordered but nursing informed us that no sitters available, tele-sitter ordered with lowered bed and fall alarm  Dermatologic:  Sacral ulcer: -foam/turning Q2 per nursing protocol   FEN/GI:NPO Prophylaxis:Lovenox  Disposition:Potential LTAC placement by CM,when medically stable but patient has many medical issues that need to be addressed  Subjective:  Jay Stephens was more alert but still not oriented today, did not remember pulling cortrak tube.  Said he was not in pain.  Asked for water and we discussed sponging his mouth because he chokes on water to which he said he didn't want to choke  Objective: Temp:  [97.4 F (36.3 C)-98.9 F (37.2 C)] 97.4 F (36.3 C) (08/15 0426) Pulse Rate:  [90-98] 90 (08/15 0426) Resp:  [16-36] 26 (08/15 0426) BP: (109-136)/(88-97) 134/96 (08/15 0426) SpO2:  [93 %-100 %] 93 % (08/15 0426) Weight:  [121 lb (54.9 kg)] 121 lb (54.9 kg) (08/15 0426) Physical Exam: General: frail, laying in bed, in NAD Eyes: EOMI, right eye somewhat laterally deviates ENTM: dry mucous membranes, dry  lips Cardiovascular: RRR, systolicmurmur, no pitting edema in bilateral ankles Respiratory: On 5L nasal canula, tachpynicmid 20s, decreasing lung sounds on right side anteriorly compared to left. Left lung sounds clear. Breath not audibly raspy from foot of bed today Gastrointestinal: +bowel sounds, nontender/soft belly MSK: thin extremities Neuro: alert and oriented to self only   Laboratory:  Recent Labs Lab 01/31/17 0014 02/01/17 0201 02/02/17 0413  WBC 6.3 8.2 6.7  HGB 8.7* 8.3* 9.8*  HCT 28.0* 27.1* 32.0*  PLT 219 236 229    Recent Labs Lab 02/01/17 0201 02/01/17 2010 02/02/17 0413  NA 150* 146* 151*  K 2.9* 5.3* 4.1  CL 110 113* 109  CO2 30 19* 27  BUN 21* 27* 27*  CREATININE 1.70* 1.71* 1.77*  CALCIUM 7.8* 8.1* 8.3*  GLUCOSE 101* 104* 117*      Imaging/Diagnostic Tests: Ct Chest W Contrast  Result Date: 01/22/2017 CLINICAL DATA:  Status post chest tube for right empyema. EXAM: CT CHEST WITH CONTRAST TECHNIQUE: Multidetector CT imaging of the chest was performed during intravenous contrast administration. CONTRAST:  75mL ISOVUE-300 IOPAMIDOL (ISOVUE-300) INJECTION 61% COMPARISON:  Chest CT dated 01/16/2017. Chest x-ray from earlier same day. FINDINGS: Cardiovascular: Heart size is normal. No pericardial effusion. Coronary artery calcifications. No thoracic aortic aneurysm or dissection. Mediastinum/Nodes: Scattered small and moderate-sized lymph nodes within the mediastinum and perihilar regions, likely reactive in nature. Trachea and central bronchi are unremarkable. Lungs/Pleura: Large complex collections are seen throughout the right chest, major components probably contiguous but also likely multiloculated to some degree. Dominant collection of fluid and air along the posterior aspects  of the right upper lobe and right lower lobe measures over 23 cm craniocaudal dimension (series 7, image 66) and approximately 12 x 7 cm transverse by AP dimensions near the right  lung base (series 7, image 61; series 3, image 126). The posterior collection appears contiguous with an additional large complex collection of fluid and air at the right lung apex (probable connection seen on series 3, image 52). The complex collection in the right lung apex measures 14 cm craniocaudal dimension and 5 cm thickness (series 7, image 54; series 3, image 63). Lastly, additional complex fluid collection is seen along the right lateral chest wall measuring 9 cm AP dimension and 2.5 cm thickness (series 3, image 80). This collection does appear contiguous with the collection at the right lung apex. Moderate-size layering pleural effusion on the left with adjacent compressive atelectasis. Upper Abdomen: Free fluid/ascites within the upper abdomen, at least a moderate amount, incompletely imaged. Musculoskeletal: No acute or suspicious osseous finding. Right-sided chest tube seen on CT of 01/16/2017 has been removed. IMPRESSION: 1. Large complex collections of fluid and air throughout the right chest, locations and measurements given above, largest collection along the posterior right chest measuring over 20 cm greatest dimension, presumed empyemas. These findings represent a significant worsening compared to the earlier chest CT of 01/16/2017. These collections appear to be contiguous but are also likely multiloculated to some degree. Right-sided chest tube has been removed in the interval. 2. Moderate-sized left pleural effusion. 3. Mediastinal and perihilar lymphadenopathy is likely reactive in nature. 4. Upper abdominal ascites, incompletely imaged. These results were called by telephone at the time of interpretation on 01/22/2017 at 11:00 am to Dr. Acquanetta BellingDD MCDIARMID , who verbally acknowledged these results. Electronically Signed   By: Bary RichardStan  Maynard M.D.   On: 01/22/2017 11:17   Dg Chest Port 1 View  Result Date: 01/22/2017 CLINICAL DATA:  Empyema.  Status post chest tube placement. EXAM: PORTABLE CHEST  1 VIEW COMPARISON:  CT chest 0 8/0 scratch the CT chest earlier today. Single-view of the chest 2017/05/16. FINDINGS: A new right chest tube is in place. Loculated pleural fluid collections in the right chest do not appear changed. Airspace disease in the right lung is also not notably changed. The left lung is expanded and clear. Left pleural effusion is noted. IMPRESSION: No marked change in loculated right pleural effusions and airspace disease with a new chest tube in place. Negative for pneumothorax. Small left pleural effusion seen on prior CT scan is not well demonstrated on this exam. Electronically Signed   By: Drusilla Kannerhomas  Dalessio M.D.   On: 01/22/2017 15:51   Dg Chest Portable 1 View  Result Date: 02/03/2017 CLINICAL DATA:  Shortness of breath.  History of empyema. EXAM: PORTABLE CHEST 1 VIEW COMPARISON:  PA and lateral chest 01/23/2017 and 01/18/2017. CT chest 01/16/2017. FINDINGS: Pleural effusion which appears loculated along the right chest wall is again seen. Right basilar effusion is new since the most recent examination. There is new airspace disease throughout the right chest. The left lung is clear. Heart size is normal. IMPRESSION: New airspace disease throughout the right chest with a new right basilar effusion worrisome for pneumonia. Loculated right pleural effusion does not appear notably changed since the most recent exam. Electronically Signed   By: Drusilla Kannerhomas  Dalessio M.D.   On: 2017/05/16 20:03     Marthenia RollingBland, Teckla Christiansen, DO 02/02/2017, 6:38 AM PGY-1, [facility] FPTS Intern pager: [phone], text pages welcome

## 2017-02-03 DIAGNOSIS — R1310 Dysphagia, unspecified: Secondary | ICD-10-CM

## 2017-02-03 DIAGNOSIS — Z7189 Other specified counseling: Secondary | ICD-10-CM

## 2017-02-03 LAB — CBC
HEMATOCRIT: 30.9 % — AB (ref 39.0–52.0)
HEMOGLOBIN: 9.2 g/dL — AB (ref 13.0–17.0)
MCH: 27.9 pg (ref 26.0–34.0)
MCHC: 29.8 g/dL — ABNORMAL LOW (ref 30.0–36.0)
MCV: 93.6 fL (ref 78.0–100.0)
Platelets: 228 10*3/uL (ref 150–400)
RBC: 3.3 MIL/uL — AB (ref 4.22–5.81)
RDW: 21 % — ABNORMAL HIGH (ref 11.5–15.5)
WBC: 5.8 10*3/uL (ref 4.0–10.5)

## 2017-02-03 LAB — BASIC METABOLIC PANEL
ANION GAP: 16 — AB (ref 5–15)
BUN: 33 mg/dL — AB (ref 6–20)
CALCIUM: 8.1 mg/dL — AB (ref 8.9–10.3)
CO2: 23 mmol/L (ref 22–32)
Chloride: 113 mmol/L — ABNORMAL HIGH (ref 101–111)
Creatinine, Ser: 1.82 mg/dL — ABNORMAL HIGH (ref 0.61–1.24)
GFR, EST AFRICAN AMERICAN: 42 mL/min — AB (ref >60)
GFR, EST NON AFRICAN AMERICAN: 36 mL/min — AB (ref >60)
GLUCOSE: 127 mg/dL — AB (ref 65–99)
Potassium: 3.6 mmol/L (ref 3.5–5.1)
SODIUM: 152 mmol/L — AB (ref 135–145)

## 2017-02-03 LAB — MAGNESIUM: Magnesium: 1.8 mg/dL (ref 1.7–2.4)

## 2017-02-03 LAB — GLUCOSE, CAPILLARY
Glucose-Capillary: 131 mg/dL — ABNORMAL HIGH (ref 65–99)
Glucose-Capillary: 152 mg/dL — ABNORMAL HIGH (ref 65–99)
Glucose-Capillary: 153 mg/dL — ABNORMAL HIGH (ref 65–99)
Glucose-Capillary: 134 mg/dL — ABNORMAL HIGH (ref 65–99)

## 2017-02-03 LAB — PHOSPHORUS: Phosphorus: 3.8 mg/dL (ref 2.5–4.6)

## 2017-02-03 MED ORDER — PIPERACILLIN-TAZOBACTAM 3.375 G IVPB 30 MIN
3.3750 g | Freq: Four times a day (QID) | INTRAVENOUS | Status: DC
  Administered 2017-02-03 – 2017-02-04 (×5): 3.375 g via INTRAVENOUS
  Filled 2017-02-03 (×6): qty 50

## 2017-02-03 MED ORDER — DEXTROSE IN LACTATED RINGERS 5 % IV SOLN
INTRAVENOUS | Status: DC
  Administered 2017-02-03 – 2017-02-05 (×3): via INTRAVENOUS

## 2017-02-03 NOTE — Progress Notes (Signed)
  Speech Language Pathology Treatment: Dysphagia  Patient Details Name: Jay Stephens MRN: [id number] DOB: 09/06/1945 Today's Date: 02/03/2017 Time: [id number] SLP Time Calculation (min) (ACUTE ONLY): 11 min  Assessment / Plan / Recommendation Clinical Impression  Pt seen 8/14 when pt was more alert and conversave. Today he is lethargic, attempted to respond to questions once, very weak. Unfortunately he is unable to safety swallow and comfort feedings is recommended as he typically requests water. SLP performed oral hygiene but did not give ice/water due to his lethargy. At present family is not responding to Palliative Cares attempts at communication. Will continue efforts for ability to consume water when more alert.   HPI HPI: Pt is a 71 y.o.debilitated man w hx malnutrition, HTN and diastolic dysfxn, recent aspiration PNA c/b S viridans R empyema. MBS recommended Dys 1 diet and nectar thick liquids by spoon. He was discharged to SNF on 8/3 but returned later the same day with encephalopathy, dyspnea, hypoxemia, transiently requiring BiPAP. BSE upon readmission recommended the same dysphagia diet and precautions. Repeat CT scan 8/4 shows multiple large fluid collections largest posterior R chest, other possibly contiguous collections. He was intubated on 8/5 with self-extubation and reintubation the same day. He self-extubated again on 8/8.      SLP Plan  Continue with current plan of care       Recommendations  Diet recommendations: NPO                Oral Care Recommendations: Oral care QID Follow up Recommendations: Skilled Nursing facility SLP Visit Diagnosis: Dysphagia, oropharyngeal phase (R13.12) Plan: Continue with current plan of care       GO                Jay Stephens, Jay Stephens 02/03/2017, 2:43 PM   Jay Stephens Rhaya Coale M.Ed CCC-SLP Pager [phone]  s

## 2017-02-03 NOTE — Progress Notes (Signed)
Daily Progress Note   Patient Name: Presten Joost       Date: 02/03/2017 DOB: 1945/08/03  Age: 71 y.o. MRN#: [id number] Attending Physician: Leeanne Rio, MD Primary Care Physician: System, Pcp Not In Admit Date: 01/31/2017  Reason for Consultation/Follow-up: Establishing goals of care   Subjective: Mr. Bergeson is more lethargic today but was able to arouse. Still pleasantly confused.    Length of Stay: 13  Current Medications: Scheduled Meds:  . chlorhexidine gluconate (MEDLINE KIT)  15 mL Mouth Rinse BID  . heparin subcutaneous  5,000 Units Subcutaneous Q8H  . insulin aspart  1-2 Units Subcutaneous TID WC  . ipratropium-albuterol  3 mL Nebulization BID  . mouth rinse  15 mL Mouth Rinse QID    Continuous Infusions: . dextrose 5% lactated ringers 50 mL/hr at 02/03/17 0849  . feeding supplement (JEVITY 1.2 CAL) Stopped (02/01/17 1418)  . piperacillin-tazobactam      PRN Meds: acetaminophen, albuterol, RESOURCE THICKENUP CLEAR  Physical Exam  Constitutional:  Frail cachectic older man; in no acute distress. Pleasantly confused  HENT:  Head: Normocephalic and atraumatic.  Neck: Normal range of motion.  Cardiovascular:  Warm and dry  Pulmonary/Chest: Effort normal. No respiratory distress.  Abdominal: Soft. He exhibits no distension.  Musculoskeletal: Normal range of motion.  Neurological: He is alert.  Oriented to himself Recognizes his family Doesn't understand his clinical condition  Psychiatric: He has a normal mood and affect.  Patient is confused; having difficulty with hospital procedures such as he pulled out his IV today because it burned  Nursing note and vitals reviewed.           Vital Signs: BP (!) 142/96   Pulse 88   Temp 97.6 F (36.4 C) (Axillary)    Resp (!) 22   Ht 5' 9"  (1.753 m)   Wt 52.6 kg (116 lb)   SpO2 94%   BMI 17.13 kg/m  SpO2: SpO2: 94 % O2 Device: O2 Device: Not Delivered O2 Flow Rate: O2 Flow Rate (L/min): 2 L/min  Intake/output summary:   Intake/Output Summary (Last 24 hours) at 02/03/17 1538 Last data filed at 02/03/17 0450  Gross per 24 hour  Intake            432.5 ml  Output  325 ml  Net            107.5 ml   LBM: Last BM Date: 01/31/17 Baseline Weight: Weight: 64 kg (141 lb 1.5 oz) Most recent weight: Weight: 52.6 kg (116 lb)       Palliative Assessment/Data: 30%    Flowsheet Rows     Most Recent Value  Intake Tab  Referral Department  Hospitalist  Unit at Time of Referral  Med/Surg Unit  Palliative Care Primary Diagnosis  Sepsis/Infectious Disease  Date Notified  01/27/17  Palliative Care Type  Return patient Palliative Care  Reason for referral  Clarify Goals of Care, Psychosocial or Spiritual support  Date of Admission  01/25/2017  Date first seen by Palliative Care  01/28/17  # of days Palliative referral response time  1 Day(s)  # of days IP prior to Palliative referral  6  Clinical Assessment  Palliative Performance Scale Score  30%  Pain Max last 24 hours  Not able to report  Pain Min Last 24 hours  Not able to report  Dyspnea Max Last 24 Hours  Not able to report  Dyspnea Min Last 24 hours  Not able to report  Nausea Max Last 24 Hours  Not able to report  Nausea Min Last 24 Hours  Not able to report  Anxiety Max Last 24 Hours  Not able to report  Anxiety Min Last 24 Hours  Not able to report  Other Max Last 24 Hours  Not able to report  Psychosocial & Spiritual Assessment  Palliative Care Outcomes  Patient/Family meeting held?  No  Who was at the meeting?  scheduled for 11am 8/11  Palliative Care Outcomes  Provided psychosocial or spiritual support  Palliative Care follow-up planned  Yes, Facility      Patient Active Problem List   Diagnosis Date Noted  .  Dysphagia   . Goals of care, counseling/discussion   . Palliative care by specialist   . Obtundation 01/22/2017  . Aspiration pneumonia ([place]) 01/22/2017  . Ascites 01/22/2017  . Emphysema lung ([place]) 01/22/2017  . Aortic atherosclerosis ([place]) 01/22/2017  . Echocardiogram shows left ventricular diastolic dysfunction, Grade Two 01/22/2017  . Respiratory distress 02/11/2017  . Pressure ulcer of sacrum 02/03/2017  . Pleural effusion on left   . Pneumothorax on right   . Palliative care encounter   . Protein-calorie malnutrition, severe ([place]) 01/11/2017  . Acute respiratory failure with hypoxia ([place])   . Empyema ([place])   . 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 Altered mental status 12/19/2016    Palliative Care Assessment & Plan   Patient Profile: 71 y.o.malewith PMH is significant for right sided empyema s/p drainage via chest tube, altered mental status, R facial droop, tooth abscess s/p extraction and I&D, protein-calorie malnutrition, electrolyte abnormalities and generalized weakness admitted with acute resp failure from Strep pneumonia and re accumulation of R empyema. Intubated on 8/5 - 8/9. Rapid response 8/10 for resp. distress w/CCM and has failed chest tube therapy and has been on antibiotics, not a candidate for cardiothoracic surgery.   Assessment:  I was able to reach nephew/surrogate decision maker, Trilby Drummer, today via phone. He said that he worked late yesterday so was not available to take my phone call. I followed up on his thoughts regarding our previous conversations for goals of care for his uncle. I offered acknowledgement that these are very difficult decisions for Trilby Drummer and his family.   Trilby Drummer tells me that he has spoken  with his family and that they all agree that they wish for continued measures to keep Mr. Riedinger alive as long as possible including full code. We further discussed feeding tube and that unfortunately Mr. Fayad removed this within ~24 hours of  receiving and has not been cooperative with further placement of feeding tube. Trilby Drummer tells me "restrain him if you have too." I further explained my concern with QOL in forcing this when he has further issues that are terminal and a feeding tube does not fix all his health issues. Offered my concern that a feeding tube is not beneficial to Mr. Masri at this time but Trilby Drummer remains firm in his decision for continued full aggressive care. I further explained to Trilby Drummer that Mr. Paddock continuously asks for water and that a feeding tube will not be satisfying to this desire for water.   Discussed with family medicine. Very difficult situation in which family consistently requests full aggressive care including full code but acknowledging that Mr. Spegal is approaching EOL (consistent family desire over past month with 3 different palliative providers). Also do not feel family understands Mr. Lundstrom level of current suffering as they are unable to be at bedside often. Please call palliative [phone] for further palliative needs.   Recommendations/Plan:  Family firm in desire for full aggressive care.    Goals of Care and Additional Recommendations:  Limitations on Scope of Treatment: Full Scope Treatment  Code Status:    Code Status Orders        Start     Ordered   01/22/17 0203  Full code  Continuous     01/22/17 0202    Code Status History    Date Active Date Inactive Code Status Order ID Comments User Context   01/05/2017  9:48 PM 02/09/2017  7:19 PM Full Code [id number]  Carlyle Dolly, MD Inpatient       Prognosis:   Unable to determine. Very poor prognosis regardless of aggressiveness of care.   Discharge Planning:  To Be Determined  Care plan was discussed with Family Medicine Service.   Thank you for allowing the Palliative Medicine Team to assist in the care of this patient.   Total Time 84mn Prolonged Time Billed  no       Greater than 50%  of this time was  spent counseling and coordinating care related to the above assessment and plan.  PPershing Proud NP   AVinie Sill NP Palliative Medicine Team Pager # [phone](M-F 8a-5p) Team Phone # [phone](Nights/Weekends)  Please contact Palliative Medicine Team phone at [phone] questions and concerns.

## 2017-02-03 NOTE — Progress Notes (Signed)
Family Medicine Teaching Service Daily Progress Note Intern Pager: [phone]  Patient name: Jay Stephens Medical record number: [id number] Date of birth: [birth year] Age: 71 y.o. Gender: male  Primary Care Provider: System, Pcp Not In Consultants: palliative, ccm, cardio, nutrition Code Status: FULL  Pt Overview and Major Events to Date:  [name] a 71 y.o.malewith PMH is significant for right sided empyema s/p drainage via chest tube, altered mental status, R facial droop, tooth abscess s/p extraction and I&D, protein-calorie malnutrition, electrolyte abnormalities and generalized weakness admitted with acute resp failure from Strep pneumonia and re accumulation of R empyema. Intubated on 8/5 - 8/9. Rapid response 8/10 for resp. distress w/CCM and Cards called, no longer in respiratory distress. High troponins from 8/11-8/14with oxygen demand and increased altered mental status. Cortrack placed 8/13 and pulled by patient on 8/14.   Patient refused replacing the cortrak bedside on 8/15 and an ethics consult was placed to discuss ethical considerations of forcing a feeding tube.  PLAN:    **Ethics consult pending given patient's physical resistance to feeding tube and questionable capacity** Pulmonary: Dyspnea secondary to strep pneumonia w/ R empyema:Tachypmic overnight in 30s-20s. On 5L nasal canula during exam. Afebrile and no leukocytosis. Blood cx drawn on 8/10 + gram+ cocci in clusters  -CCM following, appreciate their assistance -patient has self-extubated twice during prior ICU stay -O2 as needed for O2 <92% -continue zosyn -continuous pulse ox -dailyCBC, BMP -NPO- risk for aspiration -vitals per unit routine - diurese as necessary for fluid overload  Cardiac: Elevated troponin/ST changes/NSTEMI:: Troponin 3.9<3.69<2.92<3.45<3.48<2.21from 8/12 to AM 8/13. ECG showed ST changes. Evaluated by cardiology, determined to be likely due to hypoxia from respiratory issues  and anemia. Troponin 2.058/15. Denies chest pain or shortness of breath -call cardiology if new symptoms develop, but patient is not a candidate for procedural intervention -due to not being a surgical candidate, tracking of troponins has been discontinued   HFpEF: 01/12/17 Echo EF55%to 60% with G2DD. No pitting edema. 2 L UO over last 24 hours. 800 output total on 8/12 - lasix - Continue strict I/Os and daily weights  GI Aspiration risk and NPO: Patient went for modified barium swallow and recommendations are nothing by mouth secondary to severe aspiration risk -NPO  -He pulled cortrak, can possibly be replaced today.  Will hold on cortrak placement pending ethic consult  Protein-calorie malnutrition: Patient is cachectic, which is likely due to a chronic illness such as cancer as well as his low function at baseline. Nephew reported earlier that he has to be fed at home. Albumin on admission is 1.4. Seen by nutrition and palliative care during his recent hospitalization. Is NPO for now as he failed swallow evaluation. Cortrak attempt was placed on 8/11, failed.  -will need to replace feeding access but patient refuses and resisted cortrak today.  Will hold on cortrak placement pending ethic consult -with no feeding tube, we have started IVF @50ml /hr, D5LR -monitor Mag, Phos, K daily  -continue to follow nutrition and palliative care recs - D50 bolus as needed   Renal Electrolyte abnormalities: Hypokalemia resolved, K of 3.8 this morning (had rebounded to 5.3) -daily BMP  Mag 1.8 -daily checks  Phos- 3.8 -daily checks  Hypernatremia- 152 -transitioned from D5NS to D5 LR @50ml /hr  Hyperchloremia-113 -transitioned from D5NS to D5 LR @50ml /hr  AKI: Cr 1.82this morningm, trending up from 1.28. Likely pre renal etiology - Daily BMP - Attempt to avoid nephrotoxic agents -trying to balance respiratory/renal  Foley catheter in Place  Infectious:  Tooth abscess s/p  extraction and I&D:Patient denied pain in mouth. No obvious bleeding.Since the extraction occurred so recently (on 8/1), will need to be monitored for any infection or bleeding. -continue to monitor daily -chlorhexadine mouthwash TID until 8/15  Endocrine Hypoglycemia: Steady low 100s overnight -bolus of d50 as needed -custom SSI to reduce insulin administrion  Social/Disposition Goals of Care: Family wants full code for now. Patient only oriented to self today.  -Patient/s nephew and palliative unable to connect via phone last night, patient's nephew had been hoping to come in person Friday -Palliative consulted, appreciate their assistance  Hematological Anemia: currently 8.7Hgb. Transfusion threshold <8 -Received 1 unit p RBCs for hgb to 7.5 on 8/11. Hgb A1C 8.6 after transfusion.  - Daily CBC  Neurological AMS with R-sided facial droop and weakness:Patient is alert but only oriented to person, seems to be. at baseline for this visit based off previous notes - Continue to monitor   IV access: Patient has pulled multiple pivs and has been difficult to place new ones, consider PIC if we lose access again?  Pain control: -tylenol prn, can do rectal if needed  Fall risk: patient was found on the floor by his bed x 2 since being transferred out of ICU. -sitter ordered but nursing informed us that no sitters available, tele-sitter ordered with lowered bed and fall alarm  Dermatologic:  Sacral ulcer: -foam/turning Q2 per nursing protocol   FEN/GI:NPO Prophylaxis:Lovenox  Disposition:Potential LTAC placement by CM,when medically stable but patient has many medical issues that need to be addressed  Subjective:  Patient denied pain but was not oriented enough for thorough interview  Objective: Temp:  [97.7 F (36.5 C)-97.8 F (36.6 C)] 97.7 F (36.5 C) (08/16 0449) Pulse Rate:  [88-91] 88 (08/16 0449) Resp:  [17-28] 22 (08/16 0449) BP:  (127-143)/(92-102) 142/96 (08/16 0449) SpO2:  [91 %-98 %] 92 % (08/16 0449) Weight:  [116 lb (52.6 kg)] 116 lb (52.6 kg) (08/16 0449) Physical Exam: General: frail, laying in bed, in NAD Eyes: EOMI, right eye somewhat laterally deviates ENTM: dry mucous membranes, dry lips Cardiovascular: RRR, systolicmurmur, no pitting edema in bilateral ankles Respiratory: On 5L nasal canula, tachpynicmid 20s, decreasing lung sounds on right side anteriorly compared to left. Left lung sounds clear. Breath not audibly raspy from foot of bed today Gastrointestinal: +bowel sounds, nontender/soft belly MSK: thin extremities Neuro: alert and oriented to self only, but was able to physical respond to verbal direction Sacral ulcer still stage 2 but well cared for, R sided chest tube wound slightly weeping   Laboratory:  Recent Labs Lab 02/01/17 0201 02/02/17 0413 02/03/17 0304  WBC 8.2 6.7 5.8  HGB 8.3* 9.8* 9.2*  HCT 27.1* 32.0* 30.9*  PLT 236 229 228    Recent Labs Lab 02/01/17 2010 02/02/17 0413 02/03/17 0304  NA 146* 151* 152*  K 5.3* 4.1 3.6  CL 113* 109 113*  CO2 19* 27 23  BUN 27* 27* 33*  CREATININE 1.71* 1.77* 1.82*  CALCIUM 8.1* 8.3* 8.1*  GLUCOSE 104* 117* 127*      Imaging/Diagnostic Tests: Ct Chest W Contrast  Result Date: 01/22/2017 CLINICAL DATA:  Status post chest tube for right empyema. EXAM: CT CHEST WITH CONTRAST TECHNIQUE: Multidetector CT imaging of the chest was performed during intravenous contrast administration. CONTRAST:  75mL ISOVUE-300 IOPAMIDOL (ISOVUE-300) INJECTION 61% COMPARISON:  Chest CT dated 01/16/2017. Chest x-ray from earlier same day. FINDINGS: Cardiovascular: Heart size is normal. No pericardial effusion. Coronary artery  calcifications. No thoracic aortic aneurysm or dissection. Mediastinum/Nodes: Scattered small and moderate-sized lymph nodes within the mediastinum and perihilar regions, likely reactive in nature. Trachea and central bronchi are  unremarkable. Lungs/Pleura: Large complex collections are seen throughout the right chest, major components probably contiguous but also likely multiloculated to some degree. Dominant collection of fluid and air along the posterior aspects of the right upper lobe and right lower lobe measures over 23 cm craniocaudal dimension (series 7, image 66) and approximately 12 x 7 cm transverse by AP dimensions near the right lung base (series 7, image 61; series 3, image 126). The posterior collection appears contiguous with an additional large complex collection of fluid and air at the right lung apex (probable connection seen on series 3, image 52). The complex collection in the right lung apex measures 14 cm craniocaudal dimension and 5 cm thickness (series 7, image 54; series 3, image 63). Lastly, additional complex fluid collection is seen along the right lateral chest wall measuring 9 cm AP dimension and 2.5 cm thickness (series 3, image 80). This collection does appear contiguous with the collection at the right lung apex. Moderate-size layering pleural effusion on the left with adjacent compressive atelectasis. Upper Abdomen: Free fluid/ascites within the upper abdomen, at least a moderate amount, incompletely imaged. Musculoskeletal: No acute or suspicious osseous finding. Right-sided chest tube seen on CT of 01/16/2017 has been removed. IMPRESSION: 1. Large complex collections of fluid and air throughout the right chest, locations and measurements given above, largest collection along the posterior right chest measuring over 20 cm greatest dimension, presumed empyemas. These findings represent a significant worsening compared to the earlier chest CT of 01/16/2017. These collections appear to be contiguous but are also likely multiloculated to some degree. Right-sided chest tube has been removed in the interval. 2. Moderate-sized left pleural effusion. 3. Mediastinal and perihilar lymphadenopathy is likely reactive  in nature. 4. Upper abdominal ascites, incompletely imaged. These results were called by telephone at the time of interpretation on 01/22/2017 at 11:00 am to Dr. Acquanetta Belling , who verbally acknowledged these results. Electronically Signed   By: Bary Richard M.D.   On: 01/22/2017 11:17   Dg Chest Port 1 View  Result Date: 01/22/2017 CLINICAL DATA:  Empyema.  Status post chest tube placement. EXAM: PORTABLE CHEST 1 VIEW COMPARISON:  CT chest 0 8/0 scratch the CT chest earlier today. Single-view of the chest 01/23/2017. FINDINGS: A new right chest tube is in place. Loculated pleural fluid collections in the right chest do not appear changed. Airspace disease in the right lung is also not notably changed. The left lung is expanded and clear. Left pleural effusion is noted. IMPRESSION: No marked change in loculated right pleural effusions and airspace disease with a new chest tube in place. Negative for pneumothorax. Small left pleural effusion seen on prior CT scan is not well demonstrated on this exam. Electronically Signed   By: Drusilla Kanner M.D.   On: 01/22/2017 15:51   Dg Chest Portable 1 View  Result Date: 02/17/2017 CLINICAL DATA:  Shortness of breath.  History of empyema. EXAM: PORTABLE CHEST 1 VIEW COMPARISON:  PA and lateral chest 01/23/2017 and 01/18/2017. CT chest 01/16/2017. FINDINGS: Pleural effusion which appears loculated along the right chest wall is again seen. Right basilar effusion is new since the most recent examination. There is new airspace disease throughout the right chest. The left lung is clear. Heart size is normal. IMPRESSION: New airspace disease throughout the right chest with  a new right basilar effusion worrisome for pneumonia. Loculated right pleural effusion does not appear notably changed since the most recent exam. Electronically Signed   By: Drusilla Kannerhomas  Dalessio M.D.   On: June 25, 2016 20:03     Marthenia RollingBland, Izek Corvino, DO 02/03/2017, 6:41 AM PGY-1, [facility] FPT[facility]  Intern pager: [phone], text pages welcome

## 2017-02-03 NOTE — Progress Notes (Signed)
Daily Progress Note   Patient Name: Jay Stephens       Date: 02/03/2017 DOB: 02/13/1946  Age: 71 y.o. MRN#: [id number] Attending Physician: Leeanne Rio, MD Primary Care Physician: System, Pcp Not In Admit Date: 02/08/2017  Reason for Consultation/Follow-up: Establishing goals of care   Subjective: Jay Stephens continues to be resting in bed. Continues to ask for water.    Length of Stay: 13  Current Medications: Scheduled Meds:  . chlorhexidine gluconate (MEDLINE KIT)  15 mL Mouth Rinse BID  . heparin subcutaneous  5,000 Units Subcutaneous Q8H  . insulin aspart  1-2 Units Subcutaneous TID WC  . ipratropium-albuterol  3 mL Nebulization BID  . mouth rinse  15 mL Mouth Rinse QID    Continuous Infusions: . dextrose 5% lactated ringers 50 mL/hr at 02/03/17 0849  . feeding supplement (JEVITY 1.2 CAL) Stopped (02/01/17 1418)  . piperacillin-tazobactam (ZOSYN)  IV 3.375 g (02/03/17 0559)    PRN Meds: acetaminophen, albuterol, RESOURCE THICKENUP CLEAR  Physical Exam  Constitutional:  Frail cachectic older man; in no acute distress. Pleasantly confused  HENT:  Head: Normocephalic and atraumatic.  Neck: Normal range of motion.  Cardiovascular:  Warm and dry  Pulmonary/Chest: Effort normal. No respiratory distress.  Abdominal: Soft. He exhibits no distension.  Musculoskeletal: Normal range of motion.  Neurological: He is alert.  Oriented to himself Recognizes his family Doesn't understand his clinical condition  Psychiatric: He has a normal mood and affect.  Patient is confused; having difficulty with hospital procedures such as he pulled out his IV today because it burned  Nursing note and vitals reviewed.           Vital Signs: BP (!) 142/96   Pulse 88   Temp 98.6 F  (37 C) (Axillary)   Resp (!) 22   Ht _0  (1.753 m)   Wt 52.6 kg (116 lb)   SpO2 94%   BMI 17.13 kg/m  SpO2: SpO2: 94 % O2 Device: O2 Device: Not Delivered O2 Flow Rate: O2 Flow Rate (L/min): 2 L/min  Intake/output summary:   Intake/Output Summary (Last 24 hours) at 02/03/17 0920 Last data filed at 02/03/17 0450  Gross per 24 hour  Intake            482.5 ml  Output  325 ml  Net            157.5 ml   LBM: Last BM Date: 01/31/17 Baseline Weight: Weight: 64 kg (141 lb 1.5 oz) Most recent weight: Weight: 52.6 kg (116 lb)       Palliative Assessment/Data: 30%    Flowsheet Rows     Most Recent Value  Intake Tab  Referral Department  Hospitalist  Unit at Time of Referral  Med/Surg Unit  Palliative Care Primary Diagnosis  Sepsis/Infectious Disease  Date Notified  01/27/17  Palliative Care Type  Return patient Palliative Care  Reason for referral  Clarify Goals of Care, Psychosocial or Spiritual support  Date of Admission  02/11/2017  Date first seen by Palliative Care  01/28/17  # of days Palliative referral response time  1 Day(s)  # of days IP prior to Palliative referral  6  Clinical Assessment  Palliative Performance Scale Score  30%  Pain Max last 24 hours  Not able to report  Pain Min Last 24 hours  Not able to report  Dyspnea Max Last 24 Hours  Not able to report  Dyspnea Min Last 24 hours  Not able to report  Nausea Max Last 24 Hours  Not able to report  Nausea Min Last 24 Hours  Not able to report  Anxiety Max Last 24 Hours  Not able to report  Anxiety Min Last 24 Hours  Not able to report  Other Max Last 24 Hours  Not able to report  Psychosocial & Spiritual Assessment  Palliative Care Outcomes  Patient/Family meeting held?  No  Who was at the meeting?  scheduled for 11am 8/11  Palliative Care Outcomes  Provided psychosocial or spiritual support  Palliative Care follow-up planned  Yes, Facility      Patient Active Problem List   Diagnosis  Date Noted  . Palliative care by specialist   . Obtundation 01/22/2017  . Aspiration pneumonia ([place]) 01/22/2017  . Ascites 01/22/2017  . Emphysema lung ([place]) 01/22/2017  . Aortic atherosclerosis ([place]) 01/22/2017  . Echocardiogram shows left ventricular diastolic dysfunction, Grade Two 01/22/2017  . Respiratory distress 01/28/2017  . Pressure ulcer of sacrum 01/20/2017  . Pleural effusion on left   . Pneumothorax on right   . Palliative care encounter   . Protein-calorie malnutrition, severe ([place]) 01/11/2017  . Acute respiratory failure with hypoxia ([place])   . Empyema ([place])   . 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 Altered mental status 01/07/2017    Palliative Care Assessment & Plan   Patient Profile:  71 y.o.malewith PMH is significant for right sided empyema s/p drainage via chest tube, altered mental status, R facial droop, tooth abscess s/p extraction and I&D, protein-calorie malnutrition, electrolyte abnormalities and generalized weakness admitted with acute resp failure from Strep pneumonia and re accumulation of R empyema. Intubated on 8/5 - 8/9. Rapid response 8/10 for resp. distress w/CCM and has failed chest tube therapy and has been on antibiotics, not a candidate for cardiothoracic surgery.   Jay Stephens has removed his Cortrak feeding tube. I do feel that feeding tube will cause more harm than benefit to Jay Stephens and would not be in favor of replacing as this will not fix his medical issues which are terminal in nature. I attempted to speak further with nephew Trilby Drummer regarding where we go from here but he did not answer his phone and did not return my phone calls. I will continue to reach out to [facility]  for guidance on how to [place] care for Jay Stephens at this time.   Discussed with family medicine.   Recommendations/Plan:  Family considering hospice/comfort care. They are struggling with decisions.   Goals of Care and Additional Recommendations:  Limitations on Scope of  Treatment: Full Scope Treatment  Code Status:    Code Status Orders        Start     Ordered   01/22/17 0203  Full code  Continuous     01/22/17 0202    Code Status History    Date Active Date Inactive Code Status Order ID Comments User Context   01/11/2017  9:48 PM 01/20/2017  7:19 PM Full Code [id number]  Carlyle Dolly, MD Inpatient       Prognosis:   Unable to determine  Discharge Planning:  To Be Determined  Care plan was discussed with Intern with Family Medicine Service.   Thank you for allowing the Palliative Medicine Team to assist in the care of this patient.   Total Time 20mn Prolonged Time Billed  no       Greater than 50%  of this time was spent counseling and coordinating care related to the above assessment and plan.  PPershing Proud NP   AVinie Sill NP Palliative Medicine Team Pager # [phone](M-F 8a-5p) Team Phone # [phone](Nights/Weekends)  Please contact Palliative Medicine Team phone at [phone] questions and concerns.

## 2017-02-03 NOTE — Progress Notes (Addendum)
Pharmacy Antibiotic Note  Jay HibbsJessie Stephens is a 71 y.o. male with strep pneumonia with right empyema. Pharmacy consulted for Zosyn dosing to complete 14 days of treatment.  Patient is afebrile and his WBC is WNL.  His renal function is slowly worsening.  Awaiting goals of care decision.   Plan: Continue Zosyn 3.375gm IV Q8H, 4 hr infusion Pharmacy will sign off as dosage adjustment is likely unnecessary until patient completes his therapy on 02/05/17.   Height: 5\' 9"  (175.3 cm) Weight: 116 lb (52.6 kg) IBW/kg (Calculated) : 70.7  Temp (24hrs), Avg:97.7 F (36.5 C), Min:97.7 F (36.5 C), Max:97.8 F (36.6 C)   Recent Labs Lab 01/30/17 0608 01/31/17 0014 02/01/17 0201 02/01/17 2010 02/02/17 0413 02/03/17 0304  WBC 6.0 6.3 8.2  --  6.7 5.8  CREATININE 1.41* 1.53* 1.70* 1.71* 1.77* 1.82*    Estimated Creatinine Clearance: 28.5 mL/min (A) (by C-G formula based on SCr of 1.82 mg/dL (H)).    No Known Allergies   Zosyn 8/3 >> (8/18) Vanc 8/3 >> 8/8  8/7 VT 0930 = 27 on 750mg  q12 8/8 VR = 15  8/3, 8/6 UCx - negative 8/3, 8/6 BCx - negative 8/6 resp cx - negative 8/10 BCx - micrococcus spp (BCID negative):  GPC, oxidase-positive, and strictly aerobic  Previous admit cx: 7/24 body fluid - viridans strep (S quinolones, vanc)     Tricha Ruggirello D. Laney Potashang, PharmD, BCPS Pager:  [phone] 02/03/2017, 7:59 AM    ====================================   Addendum: - requested by palliative care NP to shorten Zosyn infusion time due to line/compatability issue with giving meds - Change Zosyn to  3.375gm IV Q6H, infuse each dose over 30 min.    Jadea Shiffer D. Laney Potashang, PharmD, BCPS Pager:  [phone] 02/03/2017, 1:33 PM

## 2017-02-04 LAB — BASIC METABOLIC PANEL
ANION GAP: 9 (ref 5–15)
BUN: 7 mg/dL (ref 6–20)
CO2: 27 mmol/L (ref 22–32)
Calcium: 8.2 mg/dL — ABNORMAL LOW (ref 8.9–10.3)
Chloride: 104 mmol/L (ref 101–111)
Creatinine, Ser: 0.68 mg/dL (ref 0.61–1.24)
GLUCOSE: 120 mg/dL — AB (ref 65–99)
POTASSIUM: 3.9 mmol/L (ref 3.5–5.1)
Sodium: 140 mmol/L (ref 135–145)

## 2017-02-04 LAB — GLUCOSE, CAPILLARY
GLUCOSE-CAPILLARY: 154 mg/dL — AB (ref 65–99)
GLUCOSE-CAPILLARY: 127 mg/dL — AB (ref 65–99)
Glucose-Capillary: 132 mg/dL — ABNORMAL HIGH (ref 65–99)
Glucose-Capillary: 121 mg/dL — ABNORMAL HIGH (ref 65–99)

## 2017-02-04 MED ORDER — IPRATROPIUM-ALBUTEROL 0.5-2.5 (3) MG/3ML IN SOLN: 3.0000 mL | RESPIRATORY_TRACT | Status: DC | PRN

## 2017-02-04 NOTE — Progress Notes (Signed)
Family Medicine Teaching Service Daily Progress Note Intern Pager: [phone]  Patient name: Jay Stephens Medical record number: [id number] Date of birth: [birth year] Age: 71 y.o. Gender: male  Primary Care Provider: System, Pcp Not In Consultants: palliative, ccm, cardio, nutrition Code Status: FULL  Pt Overview and Major Events to Date:  Jahmez Bily a 71 y.o.malewith PMH is significant for right sided empyema s/p drainage via chest tube, altered mental status, R facial droop, tooth abscess s/p extraction and I&D, protein-calorie malnutrition, electrolyte abnormalities and generalized weakness admitted with acute resp failure from Strep pneumonia and re accumulation of R empyema. Intubated on 8/5 - 8/9. Rapid response 8/10 for resp. distress w/CCM and Cards called, no longer in respiratory distress. High troponins from 8/11-8/14with oxygen demand and increased altered mental status. Cortrack placed 8/13 and pulled by patient on 8/14.   Patient refused replacing the cortrak bedside on 8/15 and an ethics consult was placed to discuss ethical considerations of forcing a feeding tube.  PLAN: **Ethics consult pending given patient's physical resistance to feeding tube and questionable capacity** Pulmonary: Dyspnea secondary to strep pneumonia w/ R empyema:Tachypmic overnight in 20s. On 5L nasal canula during exam. Afebrile and no leukocytosis. Blood cx drawn on 8/10 + gram+ cocci in clusters (micrococcus incloncusive, susceptibility only available on request) -CCM following, appreciate their assistance -O2 as needed for O2 <92% -continue zosyn day 14 (8/4-) -continuous pulse ox -NPO- risk for aspiration per SLP - diurese as necessary for fluid overload  Cardiac: Elevated troponin/ST changes/NSTEMI:: Troponin 3.9>>>2.26from 8/12 to AM 8/13. ECG showed ST changes. Evaluated by cardiology, determined to be likely due to hypoxia from respiratory issues and anemia.  Denies chest pain or  shortness of breath but not oriented -call cardiology if new symptoms develop, but patient is not a candidate for procedural intervention -due to not being a surgical candidate, tracking of troponins has been discontinued  HFpEF: 01/12/17 Echo EF55%to 60% with G2DD. euvolemic/hypovolemic - Continue strict I/Os and daily weights  HTN: hypertensive 140s/90s while in 81ml/hr D5LR on8/17 , reduced flowrate to 76ml/hr D5LR  GI Aspiration risk and NPO: Patient had modified barium swallow and recommendations are NPO secondary to severe aspiration risk -NPO  -He pulled 1st cortrak. Will hold on cortrak placement pending ethic consult  Protein-calorie malnutrition: Patient is cachectic, which is likely due to a chronic illness such as cancer as well as his low function at baseline. Nephew told palliative he wanted his uncle restrained if that was what it took to place feeding tube, ethics consult pending regarding that request. Albumin on admission is 1.4. Seen by nutrition and palliative care during his recent hospitalization. Is NPO for now as he failed swallow evaluation.  -will need to replace feeding access but patient refuses and resisted cortrak replacement. Will hold on cortrak placement pending ethic consult -with no feeding tube, we have started IVF  -continue to follow nutrition and palliative care recs  Renal Electrolyte abnormalities: Hypokalemia resolved, K of 3.9  Hypernatremia- resolved after IVF transition to D5LR  Hyperchloremia- resolved after IVF transition to D5LR  AKI: Cr .68 8/17 improved from 1.82 8/16.  Pre renal etiology in setting of  NPO - Daily BMP  - Attempt to avoid nephrotoxic agents -trying to balance respiratory/renal  Foley catheter in Place  Infectious:  Tooth abscess s/p extraction and I&D:Patient denied pain in mouth. No obvious bleeding.Since the extraction occurred so recently (on 8/1), will need to be monitored for any infection or  bleeding. -continue to monitor  daily  Endocrine Hypoglycemia: Steady mid 100s -D5 component to IVF -CBGs -custom SSI to reduce insulin administrion  Social/Disposition Goals of Care: Family wants full code.Patient only oriented to self today.  -Patient/s nephew and palliative unable to connect via phone last night, patient's nephew had been hoping to come in person Friday -Palliative consulted, appreciate their assistance  Hematological Anemia: last 8.7Hgb. Transfusion threshold <8 -Received 1 unit p RBCs for hgb to 7.5 on 8/11. Hgb A1C 8.6 after transfusion.  - Daily CBC  Neurological AMS with R-sided facial droop and weakness:Patient is alert but only oriented to person, seems to be. at baseline for this visit based off previous notes - Continue to monitor   Fall risk: patient was found on the floor by his bed x 2 since being transferred out of ICU. -telemonitoring, lowered bed  Dermatologic:  Sacral ulcer: -foam/turning Q2 per nursing protocol   Pain control: -tylenol prn, can do rectal if needed  FEN/GI:NPO Prophylaxis:Lovenox  Disposition:Potential LTAC placement by CM,when medically stable but patient has many medical issues that need to be addressed  Subjective:  Patient was able to be aroused but was not as interactive as in past days.  Still denied pain but was not maintaining eye contact  Objective: Temp:  [97.6 F (36.4 C)-98.6 F (37 C)] 97.7 F (36.5 C) (08/17 0442) Pulse Rate:  [88-91] 89 (08/16 2345) Resp:  [12-25] 16 (08/17 0345) BP: (139-149)/(93-100) 149/97 (08/17 0345) SpO2:  [92 %-100 %] 92 % (08/17 0345) Weight:  [123 lb (55.8 kg)] 123 lb (55.8 kg) (08/17 0424) Physical Exam: General: frail, laying in bed, in NAD Eyes: EOMI, right eye somewhat laterally deviates ENTM: mucous membranes and lips seem more moist than in past Cardiovascular: RRR, systolicmurmur, no pitting edema in bilateral ankles Respiratory: tachpynicmid  20s, decreasing lung sounds on right side anteriorly compared to left with rhonchi. Left lung sounds clear. Breath not audibly raspy from foot of bed today Gastrointestinal:nontender/soft belly MSK: thin extremities Neuro: minimally alert, but was able to physical respond to verbal direction Sacral ulcer still stage 2 but well cared for, R sided chest tube wound slightly weeping  Laboratory:  Recent Labs Lab 02/01/17 0201 02/02/17 0413 02/03/17 0304  WBC 8.2 6.7 5.8  HGB 8.3* 9.8* 9.2*  HCT 27.1* 32.0* 30.9*  PLT 236 229 228    Recent Labs Lab 02/02/17 0413 02/03/17 0304 02/04/17 0405  NA 151* 152* 140  K 4.1 3.6 3.9  CL 109 113* 104  CO2 27 23 27   BUN 27* 33* 7  CREATININE 1.77* 1.82* 0.68  CALCIUM 8.3* 8.1* 8.2*  GLUCOSE 117* 127* 120*    Imaging/Diagnostic Tests: Ct Chest W Contrast  Result Date: 01/22/2017  IMPRESSION:  1. Large complex collections of fluid and air throughout the right chest, locations and measurements given above, largest collection along the posterior right chest measuring over 20 cm greatest dimension, presumed empyemas. These findings represent a significant worsening compared to the earlier chest CT of 01/16/2017. These collections appear to be contiguous but are also likely multiloculated to some degree. Right-sided chest tube has been removed in the interval.  2. Moderate-sized left pleural effusion.  3. Mediastinal and perihilar lymphadenopathy is likely reactive in nature.  4. Upper abdominal ascites, incompletely imaged. These results were called by telephone at the time of interpretation on 01/22/2017 at 11:00 am to Dr. Acquanetta Belling , who verbally acknowledged these results. Electronically Signed   By: Bary Richard M.D.   On: 01/22/2017  11:17   Dg Chest Port 1 View  Result Date: 01/22/2017  IMPRESSION: No marked change in loculated right pleural effusions and airspace disease with a new chest tube in place. Negative for pneumothorax. Small  left pleural effusion seen on prior CT scan is not well demonstrated on this exam. Electronically Signed   By: Drusilla Kanner M.D.   On: 01/22/2017 15:51   Dg Chest Portable 1 View  Result Date: February 18, 2017  IMPRESSION: New airspace disease throughout the right chest with a new right basilar effusion worrisome for pneumonia. Loculated right pleural effusion does not appear notably changed since the most recent exam. Electronically Signed   By: Drusilla Kanner M.D.   On: 18-Feb-2017 20:03     Marthenia Rolling, DO 02/04/2017, 6:49 AM PGY-1, [facility] FPTS Intern pager: [phone], text pages welcome

## 2017-02-04 NOTE — Progress Notes (Addendum)
FPTS Interim Progress Note  **ETHICS CONSULT**  S:Ethics committee on call member returned page today to deliver results from their discussion about Mr. Palomares case.    O: BP (!) 149/97 (BP Location: Right Arm)   Pulse 89   Temp 97.9 F (36.6 C) (Axillary)   Resp 16   Ht 5\' 9"  (1.753 m)   Wt 123 lb (55.8 kg)   SpO2 94%   BMI 18.16 kg/m     A/P: The Ethics committee evaluated Mr. Bethancourt documentation and found no HCPA or Advanced directives.   After review of his clinical course,given the severity of Mr. Roshto medical condition, the decision of the Ethics committee was that it would be ethical and accurate for the medical team to invoke the principle of futility in regards to code status and forcing a feeding tube on Mr. Lopezrodriguez.  We will not force a feeding a tube on Mr. Irene at this time but he is still being kept full code until further notice.  Marthenia Rolling, DO 02/04/2017, 2:44 PM PGY-1, [facility] Family Medicine Service pager [phone]

## 2017-02-04 NOTE — Care Management Important Message (Signed)
Important Message  Patient Details  Name: Jay Stephens MRN: [id number] Date of Birth: 05-28-1946   Medicare Important Message Given:  Yes    Kyla Balzarine 02/04/2017, 10:56 AM

## 2017-02-04 NOTE — Progress Notes (Signed)
CSW has been following patient peripherally, as RNCM has also been following for possible LTAC placement. CSW continuing to follow for disposition planning needs, pending palliative and ethics meetings. CSW will remain available if needed for disposition planning.   Abigail Butts, LCSWA [phone]

## 2017-02-04 NOTE — Progress Notes (Signed)
  Speech Language Pathology Treatment: Dysphagia  Patient Details Name: Jay Stephens MRN: [id number] DOB: [birth year] Today's Date: 02/04/2017 Time: 1530-1550 SLP Time Calculation (min) (ACUTE ONLY): 20 min  Assessment / Plan / Recommendation Clinical Impression  He is was much more alert today and interactive/communicative with this SLP for dysphagia treatment. Oral hygiene performed followed by trials ice chips, thin via cup and applesauce. Thin liquid penetrating likely, triggering immediate cough. No throat clear or cough after puree, slightly increased respiratory rate. No spontaneous second swallow and cues for swallows effective 70% of the time. Unable to determine at bedside, if pt able to safely initiate any po's even if significantly modified. Cortrak has been out, agree with Ethics decision not to force replacment of tube however no decision made all week re: nutrition/comfort feedings despite attempts as documented by Palliative care. May recommend repeat MBS to evaluate after last one on 8/11. Not expecting significant change.    HPI HPI: Pt is a 71 y.o.debilitated man w hx malnutrition, HTN and diastolic dysfxn, recent aspiration PNA c/b S viridans R empyema. MBS recommended Dys 1 diet and nectar thick liquids by spoon. He was discharged to SNF on 8/3 but returned later the same day with encephalopathy, dyspnea, hypoxemia, transiently requiring BiPAP. BSE upon readmission recommended the same dysphagia diet and precautions. Repeat CT scan 8/4 shows multiple large fluid collections largest posterior R chest, other possibly contiguous collections. He was intubated on 8/5 with self-extubation and reintubation the same day. He self-extubated again on 8/8.      SLP Plan  Continue with current plan of care       Recommendations  Diet recommendations: NPO Medication Administration: Via alternative means                Oral Care Recommendations: Oral care QID Follow up  Recommendations: Skilled Nursing facility SLP Visit Diagnosis: Dysphagia, oropharyngeal phase (R13.12) Plan: Continue with current plan of care       GO                Royce Macadamia 02/04/2017, 3:55 PM  Breck Coons Everlee Quakenbush M.Ed [employer] [phone]

## 2017-02-05 ENCOUNTER — Inpatient Hospital Stay (HOSPITAL_COMMUNITY): Payer: Medicare Other

## 2017-02-05 DIAGNOSIS — R188 Other ascites: Secondary | ICD-10-CM

## 2017-02-05 DIAGNOSIS — I700 Atherosclerosis of aorta: Secondary | ICD-10-CM

## 2017-02-05 DIAGNOSIS — J189 Pneumonia, unspecified organism: Secondary | ICD-10-CM

## 2017-02-05 DIAGNOSIS — R54 Age-related physical debility: Secondary | ICD-10-CM

## 2017-02-05 LAB — BASIC METABOLIC PANEL
Anion gap: 13 (ref 5–15)
BUN: 35 mg/dL — ABNORMAL HIGH (ref 6–20)
CHLORIDE: 117 mmol/L — AB (ref 101–111)
CO2: 26 mmol/L (ref 22–32)
CREATININE: 1.76 mg/dL — AB (ref 0.61–1.24)
Calcium: 8.4 mg/dL — ABNORMAL LOW (ref 8.9–10.3)
GFR calc non Af Amer: 38 mL/min — ABNORMAL LOW (ref >60)
GFR, EST AFRICAN AMERICAN: 44 mL/min — AB (ref >60)
Glucose, Bld: 125 mg/dL — ABNORMAL HIGH (ref 65–99)
Potassium: 3.4 mmol/L — ABNORMAL LOW (ref 3.5–5.1)
Sodium: 156 mmol/L — ABNORMAL HIGH (ref 135–145)

## 2017-02-05 LAB — GLUCOSE, CAPILLARY
GLUCOSE-CAPILLARY: 128 mg/dL — AB (ref 65–99)
GLUCOSE-CAPILLARY: 135 mg/dL — AB (ref 65–99)
Glucose-Capillary: 120 mg/dL — ABNORMAL HIGH (ref 65–99)

## 2017-02-05 MED ORDER — SODIUM CHLORIDE 0.9 % IV BOLUS (SEPSIS)
250.0000 mL | Freq: Once | INTRAVENOUS | Status: AC
  Administered 2017-02-05: 250 mL via INTRAVENOUS

## 2017-02-05 NOTE — Progress Notes (Signed)
  Speech Language Pathology Treatment: Dysphagia  Patient Details Name: Jay Stephens MRN: [id number] DOB: [birth year] Today's Date: 02/05/2017 Time: 1015-1030 SLP Time Calculation (min) (ACUTE ONLY): 15 min  Assessment / Plan / Recommendation Clinical Impression  Patient seen for follow-up for dysphagia to determine appropriateness for MBS this date. RN, MD present upon SLP entry, discussed plan of care. Ethics committee has determined not to force feeding tube placement on pt at this time; per MD awaiting family discussion (tonight or tomorrow) prior to change of code status, comfort measures. Pt initially lethargic, but does maintain alertness, interact with SLP with cues. After performing oral care, SLP provided skilled observation, differential diagnosis with trials of ice chips, pureed solids. Pt initiating swallow with max cues in 50% of trials, with intermittent overt signs of aspiration with both consistencies including immediate cough. At this juncture given pt's progressive decline and clinical appearance, improvements in pt's swallow function are unlikely. MBS unlikely to change outcome as pt appears to be aspirating even modified consistencies. For comfort measures, recommend dys 1, thin liquids. In the event of prolonged transition to comfort measures, recommend pt to be allowed a few ice chips after oral care when he is requesting water.      HPI HPI: Pt is a 71 y.o.debilitated man w hx malnutrition, HTN and diastolic dysfxn, recent aspiration PNA c/b S viridans R empyema. MBS recommended Dys 1 diet and nectar thick liquids by spoon. He was discharged to SNF on 8/3 but returned later the same day with encephalopathy, dyspnea, hypoxemia, transiently requiring BiPAP. BSE upon readmission recommended the same dysphagia diet and precautions. Repeat CT scan 8/4 shows multiple large fluid collections largest posterior R chest, other possibly contiguous collections. He was intubated on 8/5 with  self-extubation and reintubation the same day. He self-extubated again on 8/8.      SLP Plan  Continue with current plan of care       Recommendations  Diet recommendations: NPO Medication Administration: Via alternative means                Oral Care Recommendations: Oral care QID Follow up Recommendations: Skilled Nursing facility SLP Visit Diagnosis: Dysphagia, oropharyngeal phase (R13.12) Plan: Continue with current plan of care       GO              Rondel Baton, MS, CCC-SLP Speech-Language Pathologist [phone]  Arlana Lindau 02/05/2017, 10:38 AM

## 2017-02-05 NOTE — Progress Notes (Signed)
FPTS Interim Progress Note  S:Received a page from nursing that [name] was desaturating into the 80s and had been refusing nasal canula.   I asked nursing to put mits on [name] and retry nasal canula and immediately went to evaluate the patient.  We want to thank nursing for prompt notification of his status.  O: BP (!) 138/98 (BP Location: Right Arm)   Pulse 84   Temp (!) 97.5 F (36.4 C) (Axillary)   Resp 20   Ht 5\' 9"  (1.753 m)   Wt 137 lb (62.1 kg)   SpO2 90%   BMI 20.23 kg/m   Upon arriving the room, his nurse and NT had placed mitts on him and he comfortably on nasal canula and didn't seem to be frustrated with it.   He did not appear to be in respiratory distress and had no visible IWB over baseline from his hospitalization.  He was verbally responsive with short answers as has been his norm and said he would try to leave the nasal canula in, although with his lack of orientation we Jay Stephens not know if that  Will happen.  Decreased lung sounds with rhonchi on R lung, crackles on lower left lung.  A/P: Possible increase of infection/empyema but likely pulmonary edema from fluid bolus and increase in IVF rate to address rising creatinine.  [name] is very ill and his fluid status impacts both kidney and pulmonary function.  Decreased fluid to 71ml/hr CXR portable stat  Jay Rolling, Jay Stephens 02/05/2017, 11:18 PM PGY-1, [facility] Family Medicine Service pager [phone]

## 2017-02-05 NOTE — Progress Notes (Signed)
O2 sats in 80s, pt refused to wear [place]. Teaching services notified, Dr. Parke Simmers in to assess pt. Ordered to placed mittens on, [place] on 2L.

## 2017-02-05 NOTE — Progress Notes (Signed)
FPTS Interim Progress Note  S: Called by nursing to evaluate if the patient should have his heparin d/c due to site bleeding. Patient lying in bed appearing comfortable. Answered some of our questions, but did not seem completely alert. Patient denied any bleeding that he knew of.   O: BP (!) 151/106 (BP Location: Left Arm)   Pulse 85   Temp 97.6 F (36.4 C) (Axillary)   Resp (!) 7   Ht 5\' 9"  (1.753 m)   Wt 137 lb (62.1 kg)   SpO2 91%   BMI 20.23 kg/m    Cardio: RRR Pulmonary: CTA BL, no crackles Skin: no oozing from chest tube or ulcer, no blood in foley bag Extremities: no LE edema noted  A/P: Continue patient's heparin due to minimal bleeding noted on exam. Will not give another bolus of fluid at this time due to patient appearing euvolemic and discontinuing of his foley.   Khadijah Mastrianni, [place], DO 02/05/2017, 3:54 PM PGY-1, [facility] Family Medicine Service pager [phone]

## 2017-02-05 NOTE — Progress Notes (Signed)
Family Medicine Teaching Service Daily Progress Note Intern Pager: [phone]  Patient name: Jay Stephens Medical record number: [id number] Date of birth: [birth year] Age: 72 y.o. Gender: male  Primary Care Provider: System, Pcp Not In Consultants: palliative, ccm, cardio, nutrition Code Status: FULL  Pt Overview and Major Events to Date:  Jay Stephens a 69 y.o.malewith PMH is significant for right sided empyema s/p drainage via chest tube, altered mental status, R facial droop, tooth abscess s/p extraction and I&D, protein-calorie malnutrition, electrolyte abnormalities and generalized weakness admitted with acute resp failure from Strep pneumonia and re accumulation of R empyema. Intubated on 8/5 - 8/9. Rapid response 8/10 for resp. distress w/CCM and Cards called, no longer in respiratory distress. High troponins from 8/11-8/14with oxygen demand and increased altered mental status. Cortrack placed 8/13 and pulled by patient on 8/14.   Patient refused replacing the cortrak bedside on 8/15 and an ethics consult was placed to discuss ethical considerations of forcing a feeding tube. Ethics determined that patient's son is not official HCPOA and only next of kin, will not force feeding tube. Remains full code per nephew's wishes.   PLAN:  Pulmonary: Dyspnea secondary to strep pneumonia w/ R empyema:Increased respiratory rate overnight in 20s intermittently. Fluctuates between room air and 2 L nasal cannula for desaturations into the high 80's. Afebrile. Blood cx drawn on 8/10 + gram+ cocci in clusters (micrococcus incloncusive, susceptibility only available on request), likely contaminant. Zosyn stopped yesterday.  -CCM following, appreciate their assistance -O2 as needed for O2 <92% -continuous pulse ox -NPO- risk for aspiration per SLP - diurese as necessary for fluid overload  Cardiac: Elevated troponin/ST changes/NSTEMI:: Troponin 3.9>>>2.55from 8/12 to AM 8/13. ECG showed ST  changes. Evaluated by cardiology, determined to be likely due to hypoxia from respiratory issues and anemia.  Denies chest pain or shortness of breath but not oriented -call cardiology if new symptoms develop, but patient is not a candidate for procedural intervention -due to not being a surgical candidate, tracking of troponins has been discontinued  HFpEF: 01/12/17 Echo EF55%to 60% with G2DD. Slightly hypovolemic, fluids restarted yesterday. - Continue strict I/Os and daily weights - gentle fluids  HTN: Normotensive for age - Continue vitals per floor protocol.  - No antihypertensives at this point  GI Aspiration risk and NPO: Patient had modified barium swallow and recommendations are NPO secondary to severe aspiration risk -NPO  -No feeding tubes at this time as patient continues to pull out cortrak and refusing replacement  Protein-calorie malnutrition: Patient is cachectic, which is likely due to a chronic illness such as cancer as well as his low function at baseline. Nephew told palliative he wanted his uncle restrained if that was what it took to place feeding tube, ethics consult pending regarding that request. Albumin on admission is 1.4. Seen by nutrition and palliative care during his recent hospitalization. Is NPO for now as he failed swallow evaluation.  -IVF - No cortrak replacement as above -continue to follow SLP, nutrition and palliative care recs  Renal Electrolyte abnormalities: Hypokalemia mostly normal, K of 3.4. Hypernatremia- worse at 156. Hyperchloremia- 117  AKI: Worsening: Cr 0.68>1.76  Pre renal etiology in setting of  NPO. Dehydrated.  - Daily BMP  - Attempt to avoid nephrotoxic agents -trying to balance respiratory/renal - 250 cc bolus - increase D5 lactated ringers to 50 cc/hr  Foley catheter in Place  Infectious:  Tooth abscess s/p extraction and I&D:Patient denied pain in mouth. No obvious bleeding.Since the extraction occurred so  recently (on 8/1), will need to be monitored for any infection or bleeding. -continue to monitor daily  Endocrine Hypoglycemia: Steady mid 100s -D5 component to IVF -CBGs -custom SSI to reduce insulin administrion  Social/Disposition Goals of Care: Family wants full code.Patient only oriented to self today.  -Palliative consulted, appreciate their assistance -Patient's nephew to meet with Dr. Leveda Anna on Sunday 8/19.   Hematological Anemia: last 8.7Hgb. Transfusion threshold <8. Received 1 unit p RBCs for hgb to 7.5 on 8/11. Hgb A1C 8.6 after transfusion.  - Will check CBC tomorrow morning  Neurological AMS with R-sided facial droop and weakness:Patient is alert but only oriented to person, seems to be. at baseline for this visit based off previous notes - Continue to monitor   Fall risk: patient was found on the floor by his bed x 2 since being transferred out of ICU. -telemonitoring, lowered bed  Dermatologic:  Sacral ulcer: -foam/turning Q2 per nursing protocol  Pain control: -tylenol prn, can do rectal if needed  FEN/GI:NPO Prophylaxis:Lovenox  Disposition:Potential LTAC placement by CM vs hospice, has many medical issues that need to be addressed  Subjective:  Patient less interactive this morning, appears worse. Unable to talk with me.   Objective: Temp:  [97.6 F (36.4 C)-98.6 F (37 C)] 97.6 F (36.4 C) (08/18 0538) Pulse Rate:  [85-93] 85 (08/18 0749) Resp:  [13-29] 16 (08/18 0749) BP: (125-146)/(79-97) 146/97 (08/18 0749) SpO2:  [87 %-93 %] 91 % (08/18 0749) Weight:  [137 lb (62.1 kg)] 137 lb (62.1 kg) (08/18 0538) Physical Exam: General: frail, laying in bed, in NAD ENTM: mucous membranes and lips seem dry Cardiovascular: RRR, systolicmurmur, no pitting edema in bilateral ankles Respiratory: slightly increased respiratory rate, decreased breath sounds bilaterally  Gastrointestinal:nontender/soft belly MSK: thin extremities Neuro:  arousable but appears lethargic  Laboratory:  Recent Labs Lab 02/01/17 0201 02/02/17 0413 02/03/17 0304  WBC 8.2 6.7 5.8  HGB 8.3* 9.8* 9.2*  HCT 27.1* 32.0* 30.9*  PLT 236 229 228    Recent Labs Lab 02/03/17 0304 02/04/17 0405 02/05/17 0333  NA 152* 140 156*  K 3.6 3.9 3.4*  CL 113* 104 117*  CO2 23 27 26   BUN 33* 7 35*  CREATININE 1.82* 0.68 1.76*  CALCIUM 8.1* 8.2* 8.4*  GLUCOSE 127* 120* 125*    Imaging/Diagnostic Tests: Ct Chest W Contrast  Result Date: 01/22/2017  IMPRESSION:  1. Large complex collections of fluid and air throughout the right chest, locations and measurements given above, largest collection along the posterior right chest measuring over 20 cm greatest dimension, presumed empyemas. These findings represent a significant worsening compared to the earlier chest CT of 01/16/2017. These collections appear to be contiguous but are also likely multiloculated to some degree. Right-sided chest tube has been removed in the interval.  2. Moderate-sized left pleural effusion.  3. Mediastinal and perihilar lymphadenopathy is likely reactive in nature.  4. Upper abdominal ascites, incompletely imaged. These results were called by telephone at the time of interpretation on 01/22/2017 at 11:00 am to Dr. Acquanetta Belling , who verbally acknowledged these results. Electronically Signed   By: Bary Richard M.D.   On: 01/22/2017 11:17   Dg Chest Port 1 View  Result Date: 01/22/2017  IMPRESSION: No marked change in loculated right pleural effusions and airspace disease with a new chest tube in place. Negative for pneumothorax. Small left pleural effusion seen on prior CT scan is not well demonstrated on this exam. Electronically Signed   By: Maisie Fus  Dalessio M.D.   On: 01/22/2017 15:51   Dg Chest Portable 1 View  Result Date: 02/13/2017  IMPRESSION: New airspace disease throughout the right chest with a new right basilar effusion worrisome for pneumonia. Loculated right  pleural effusion does not appear notably changed since the most recent exam. Electronically Signed   By: Drusilla Kanner M.D.   On: 01/27/2017 20:03     Beaulah Dinning, MD 02/05/2017, 8:58 AM PGY-3, [place] [facility] FPTS Intern pager: [phone], text pages welcome

## 2017-02-06 DIAGNOSIS — J690 Pneumonitis due to inhalation of food and vomit: Secondary | ICD-10-CM

## 2017-02-06 DIAGNOSIS — I519 Heart disease, unspecified: Secondary | ICD-10-CM

## 2017-02-06 DIAGNOSIS — E43 Unspecified severe protein-calorie malnutrition: Secondary | ICD-10-CM

## 2017-02-06 LAB — BASIC METABOLIC PANEL
Anion gap: 13 (ref 5–15)
BUN: 30 mg/dL — ABNORMAL HIGH (ref 6–20)
CALCIUM: 8.3 mg/dL — AB (ref 8.9–10.3)
CHLORIDE: 117 mmol/L — AB (ref 101–111)
CO2: 26 mmol/L (ref 22–32)
Creatinine, Ser: 1.55 mg/dL — ABNORMAL HIGH (ref 0.61–1.24)
GFR calc Af Amer: 51 mL/min — ABNORMAL LOW (ref >60)
GFR calc non Af Amer: 44 mL/min — ABNORMAL LOW (ref >60)
GLUCOSE: 128 mg/dL — AB (ref 65–99)
Potassium: 3.2 mmol/L — ABNORMAL LOW (ref 3.5–5.1)
Sodium: 156 mmol/L — ABNORMAL HIGH (ref 135–145)

## 2017-02-06 LAB — CBC
HEMATOCRIT: 33.8 % — AB (ref 39.0–52.0)
Hemoglobin: 9.6 g/dL — ABNORMAL LOW (ref 13.0–17.0)
MCH: 27.4 pg (ref 26.0–34.0)
MCHC: 28.4 g/dL — ABNORMAL LOW (ref 30.0–36.0)
MCV: 96.6 fL (ref 78.0–100.0)
Platelets: 165 10*3/uL (ref 150–400)
RBC: 3.5 MIL/uL — ABNORMAL LOW (ref 4.22–5.81)
RDW: 24.5 % — AB (ref 11.5–15.5)
WBC: 5.2 10*3/uL (ref 4.0–10.5)

## 2017-02-06 MED ORDER — DEXTROSE-NACL 5-0.45 % IV SOLN
INTRAVENOUS | Status: DC
  Administered 2017-02-06: 01:00:00 via INTRAVENOUS

## 2017-02-06 NOTE — Progress Notes (Signed)
Left message for patient's nephew Jahron Bixler. Informed him that changes have been made to his uncle's medical care. Our team had hoped to make these decisions with his contributions to the discussion at planned family meeting at [facility] today, but nephew did not show. Let nephew know we would like to discuss changes to his uncle's care.  Dani Gobble, MD [facility], PGY-3

## 2017-02-06 NOTE — Progress Notes (Signed)
Family Medicine Teaching Service Daily Progress Note Intern Pager: [phone]  Patient name: Jay Stephens Medical record number: [id number] Date of birth: 03-May-1946 Age: 71 y.o. Gender: male  Primary Care Provider: System, Pcp Not In Consultants: palliative, ccm, cardio, nutrition, ethics Code Status: FULL  Pt Overview and Major Events to Date:  Keahi Mccarney a 71 y.o.malewith PMH is significant for right sided empyema s/p drainage via chest tube, altered mental status, R facial droop, tooth abscess s/p extraction and I&D, protein-calorie malnutrition, electrolyte abnormalities and generalized weakness admitted with acute resp failure from Strep pneumonia and re accumulation of R empyema. Intubated on 8/5 - 8/9. Rapid response 8/10 for resp. distress w/CCM and Cards called, no longer in respiratory distress. High troponins from 8/11-8/14with oxygen demand and increased altered mental status. Cortrack placed 8/13 and pulled by patient on 8/14. Patient refused replacing the cortrak bedside on 8/15 and an ethics consult was placed to discuss ethical considerations of forcing a feeding tube. Ethics determined that patient's son is not official HCPOA and only next of kin, will not force feeding tube. Remains full code per nephew's wishes pending 8/19 scheduled meeting with FM team @2pm .  PLAN:  Pulmonary: Dyspnea secondary to strep pneumonia w/ R empyema:for most the week has been in 20s intermittently. Fluctuates between room air and 2 L nasal cannula for desaturations into the high 80's. Hypothermic overnight 8/18. Zosyn course complete (14 days).   CXR 8/18 PM showed no significant interval change since prior radiograph with diffuse right pleural effusion or thickening and underlying opacity in the lung parenchyma. -O2 as needed for O2 <92% -continuous pulse ox -NPO- risk for aspiration per SLP -diurese as necessary for fluid overload  Cardiac: Elevated troponin/ST changes/NSTEMI::  Troponin 3.9>>>2.53from 8/12 to AM 8/13. ECG showed ST changes. Evaluated by cardiology, determined to be likely due to hypoxia from respiratory issues and anemia.  Denies chest pain or shortness of breath but not oriented -call cardiology if new symptoms develop, but patient is not a candidate for procedural intervention -due to not being a surgical candidate, tracking of troponins has been discontinued  HFpEF: 01/12/17 Echo EF55%to 60% with G2DD. Slightly hypovolemic, fluids restarted yesterday. - Continue strict I/Os and daily weights - gentle fluids (reduced to 86ml/hr overnight 8/18)  HTN: Normotensive for age - Continue vitals per floor protocol.  - No antihypertensives at this point  GI Aspiration risk and NPO: Patient had modified barium swallow and recommendations are NPO secondary to severe aspiration risk -NPO  -No feeding tubes at this time as patient continues to pull out cortrak and refusing replacement  Protein-calorie malnutrition: Patient is cachectic, which is likely due to a chronic illness such as cancer as well as his low function at baseline. Nephew told palliative he wanted his uncle restrained if that was what it took to place feeding tube, ethics consult confirmed that medical team may appropriately apply futility protocol and not restrain Mr. Birky for a feeding tube. Albumin on admission is 1.4. Seen by nutrition and palliative care during his recent hospitalization. Is NPO for now as he failed swallow evaluation.  -IVF - No cortrak replacement as above  Renal Electrolyte abnormalities: Hypernatremic -changed IVF to D5 halfNS @25ml /hr  AKI: Worsening: Cr 0.68>1.76  Likely Pre renal etiology in setting of  NPO  - Daily BMP  - Attempt to avoid nephrotoxic agents -trying to balance respiratory/renal -IVF was impacting, respirations so IVF now D5 halfNS @25ml /hr  Foley catheter in Place  Infectious:  Tooth  abscess s/p extraction and I&D:Patient  denied pain in mouth. No obvious bleeding.Since the extraction occurred so recently (on 8/1), will need to be monitored for any infection or bleeding. -continue to monitor daily  Endocrine Hypoglycemia: Steady mid 100s -D5 component to IVF -CBGs -custom SSI to reduce insulin administrion  Social/Disposition Goals of Care: Family wants full code.Patient only oriented to self today.  -Palliative consulted, appreciate their assistance -Patient's nephew to meet with Dr. Leveda Anna on Sunday 8/19.   Hematological Anemia: last 8.7Hgb. Transfusion threshold <8. Received 1 unit p RBCs for hgb to 7.5 on 8/11. Hgb A1C 8.6 after transfusion.  - Will check CBC tomorrow morning  Neurological AMS with R-sided facial droop and weakness:Patient is alert but only oriented to person, seems to be. at baseline for this visit based off previous notes - Continue to monitor   Fall risk: patient was found on the floor by his bed x 2 since being transferred out of ICU. -telemonitoring, lowered bed  Dermatologic:  Sacral ulcer: -foam/turning Q2 per nursing protocol  Pain control: -tylenol prn, can do rectal if needed  FEN/GI:NPO Prophylaxis:Lovenox  Disposition:Potential LTAC placement by CM vs hospice  Subjective:  Patient arousable but minimally answering questions,   Objective: Temp:  [97.5 F (36.4 C)-97.6 F (36.4 C)] 97.5 F (36.4 C) (08/18 2213) Pulse Rate:  [84-86] 84 (08/18 2213) Resp:  [7-23] 20 (08/18 2213) BP: (138-151)/(97-106) 138/98 (08/18 2213) SpO2:  [85 %-91 %] 90 % (08/18 2227) Weight:  [137 lb (62.1 kg)] 137 lb (62.1 kg) (08/18 0538) Physical Exam: General: frail, laying in bed, in NAD ENTM: mucous membranes and lips seem dry Cardiovascular: RRR, systolicmurmur, no pitting edema in bilateral ankles Respiratory: slightly increased respiratory rate, decreased breath sounds on R w/ rhonchi present, left lower lobe rhonci, left uper lobe  CTA Gastrointestinal:nontender/soft belly MSK: thin extremities Neuro: arousable but appears drowsy bordering on lethargic, not as able to converse as prior exams, clearly declining  Laboratory:  Recent Labs Lab 02/01/17 0201 02/02/17 0413 02/03/17 0304  WBC 8.2 6.7 5.8  HGB 8.3* 9.8* 9.2*  HCT 27.1* 32.0* 30.9*  PLT 236 229 228    Recent Labs Lab 02/03/17 0304 02/04/17 0405 02/05/17 0333  NA 152* 140 156*  K 3.6 3.9 3.4*  CL 113* 104 117*  CO2 23 27 26   BUN 33* 7 35*  CREATININE 1.82* 0.68 1.76*  CALCIUM 8.1* 8.2* 8.4*  GLUCOSE 127* 120* 125*      Imaging/Diagnostic Tests: Ct Chest W Contrast  Result Date: 01/22/2017 IMPRESSION: 1. Large complex collections of fluid and air throughout the right chest, locations and measurements given above, largest collection along the posterior right chest measuring over 20 cm greatest dimension, presumed empyemas. These findings represent a significant worsening compared to the earlier chest CT of 01/16/2017. These collections appear to be contiguous but are also likely multiloculated to some degree. Right-sided chest tube has been removed in the interval. 2. Moderate-sized left pleural effusion. 3. Mediastinal and perihilar lymphadenopathy is likely reactive in nature. 4. Upper abdominal ascites, incompletely imaged. These results were called by telephone at the time of interpretation on 01/22/2017 at 11:00 am to Dr. Acquanetta Belling , who verbally acknowledged these results. Electronically Signed   By: Bary Richard M.D.   On: 01/22/2017 11:17   Dg Chest Port 1 View  Result Date: 01/22/2017  IMPRESSION: No marked change in loculated right pleural effusions and airspace disease with a new chest tube in place. Negative for pneumothorax.  Small left pleural effusion seen on prior CT scan is not well demonstrated on this exam. Electronically Signed   By: Drusilla Kanner M.D.   On: 01/22/2017 15:51   Dg Chest Portable 1 View  Result Date:  2017/02/14  IMPRESSION: New airspace disease throughout the right chest with a new right basilar effusion worrisome for pneumonia. Loculated right pleural effusion does not appear notably changed since the most recent exam. Electronically Signed   By: Drusilla Kanner M.D.   On: 2017/02/14 20:03     Marthenia Rolling, DO 02/06/2017, 12:54 AM PGY-1, [place] [facility] FPTS Intern pager: [phone], text pages welcome

## 2017-02-07 MED ORDER — POLYVINYL ALCOHOL 1.4 % OP SOLN
1.0000 [drp] | Freq: Four times a day (QID) | OPHTHALMIC | Status: DC | PRN
  Filled 2017-02-07: qty 15

## 2017-02-07 MED ORDER — GLYCOPYRROLATE 0.2 MG/ML IJ SOLN: 0.2000 mg | INTRAMUSCULAR | Status: DC | PRN

## 2017-02-07 MED ORDER — HALOPERIDOL LACTATE 5 MG/ML IJ SOLN: 0.5000 mg | INTRAMUSCULAR | Status: DC | PRN

## 2017-02-07 MED ORDER — ACETAMINOPHEN 325 MG PO TABS: 650.0000 mg | ORAL_TABLET | Freq: Four times a day (QID) | ORAL | Status: DC | PRN

## 2017-02-07 MED ORDER — HALOPERIDOL LACTATE 2 MG/ML PO CONC
0.5000 mg | ORAL | Status: DC | PRN
  Filled 2017-02-07: qty 0.3

## 2017-02-07 MED ORDER — ONDANSETRON HCL 4 MG/2ML IJ SOLN: 4.0000 mg | Freq: Four times a day (QID) | INTRAMUSCULAR | Status: DC | PRN

## 2017-02-07 MED ORDER — ONDANSETRON 4 MG PO TBDP
4.0000 mg | ORAL_TABLET | Freq: Four times a day (QID) | ORAL | Status: DC | PRN
  Filled 2017-02-07: qty 1

## 2017-02-07 MED ORDER — GLYCOPYRROLATE 1 MG PO TABS: 1.0000 mg | ORAL_TABLET | ORAL | Status: DC | PRN

## 2017-02-07 MED ORDER — BIOTENE DRY MOUTH MT LIQD: 15.0000 mL | OROMUCOSAL | Status: DC | PRN

## 2017-02-07 MED ORDER — ACETAMINOPHEN 650 MG RE SUPP: 650.0000 mg | Freq: Four times a day (QID) | RECTAL | Status: DC | PRN

## 2017-02-07 MED ORDER — HALOPERIDOL 0.5 MG PO TABS
0.5000 mg | ORAL_TABLET | ORAL | Status: DC | PRN
  Filled 2017-02-07: qty 1

## 2017-02-19 NOTE — Progress Notes (Signed)
Nutrition Brief Note  Chart reviewed. Pt now transitioning to comfort care.  No further nutrition interventions warranted at this time.  Please re-consult as needed.   Reace Breshears A. Kurtis Anastasia, RD, LDN, CDE Pager: [phone] After hours Pager: [phone]  

## 2017-02-19 NOTE — Progress Notes (Signed)
Family Medicine Teaching Service Daily Progress Note Intern Pager: [phone]  Patient name: Jay Stephens Medical record number: [id number] Date of birth: 1945-09-04 Age: 71 y.o. Gender: male  Primary Care Provider: System, Pcp Not In Consultants: palliative, ccm, cardio, nutrition, ethics Code Status: DNR, comfort only  Pt Overview and Major Events to Date:  Jay Stephens a 71 y.o.malewith PMH is significant for right sided empyema s/p drainage via chest tube, altered mental status, R facial droop, tooth abscess s/p extraction and I&D, protein-calorie malnutrition, electrolyte abnormalities and generalized weakness admitted with acute resp failure from Strep pneumonia and re accumulation of R empyema. Intubated on 8/5 - 8/9. Rapid response 8/10 for resp. distress w/CCM and Cards called, no longer in respiratory distress. High troponins from 8/11-8/14with oxygen demand and increased altered mental status. Cortrack placed 8/13 and pulled by patient on 8/14. Patient refused replacing the cortrak bedside on 8/15 and an ethics consult was placed to discuss ethical considerations of forcing a feeding tube. Ethics determined that patient's son is not official HCPOA and only next of kin, will not force feeding tube. FM team had meeting with family scheduled for 8/19 @2pm  but family did not come and was not answering phone.  We left a message to call and discuss changes being made to the care plan.  Jay Stephens was placed as a DNR given the futility of treatment and consistent agreement amongst the services caring for him that he would not recover and we did not want to harm him.  PLAN:  Pulmonary: Dyspnea secondary to strep pneumonia w/ R empyema:for most the week has been in 20s intermittently. Fluctuates between room air and 2 L nasal cannula for desaturations into the high 80's. Hypothermic overnight 8/18. Zosyn course complete (14 days).   CXR 8/18 PM showed no significant interval change since  prior radiograph with diffuse right pleural effusion or thickening and underlying opacity in the lung parenchyma. -O2 as needed for O2 <92%  Cardiac: Elevated troponin/ST changes/NSTEMI:: Troponin 3.9>>>2.85from 8/12 to AM 8/13. ECG showed ST changes. Evaluated by cardiology, determined to be likely due to hypoxia from respiratory issues and anemia. Denies chest pain or shortness of breath but not oriented -cardiology cardiology states patient is not a candidate for procedural intervention -due to not being a surgical candidate, tracking of troponins has been discontinued  HFpEF: 01/12/17 Echo EF55%to 60% with G2DD. Slightly hypovolemic,  - fluids DD'd and patient allowed sips for comfort  HTN: not monitoring BP, comfort care only  GI Aspiration risk and NPO: Patient had modified barium swallow and recommendations are NPO secondary to severe aspiration risk -sips for patient comfort but no feeding tube will be forced on patient  Protein-calorie malnutrition: Patient is cachectic, which is likely due to a chronic illness such as cancer as well as his low function at baseline. Nephew told palliative he wanted his uncle restrained if that was what it took to place feeding tube, ethics consult confirmed that medical team may appropriately apply futility protocol and not restrain Jay Stephens for a feeding tube. - -sips for comfort - No cortrak replacement as above  Renal Electrolyte abnormalities: comfort care only  AKI: comfort care only  Infectious:  Tooth abscess s/p extraction and I&D:resolved  Endocrine Hypoglycemia: comfort care only -cbgs stopped  Social/Disposition Goals of Care: Family had expressed desire for full code status but ethics consult confirmed medical team was accurate and appropriate to operate under Futility principles and place Jay Stephens as DNR/comfort care only for  concern of harming him with no benefit to him and no forseeable chance of recovery.    We had scheduled a meeting 8/19 @2mp  to discuss with his nephew but the family did not arrive and did not answer their phone.   We left a message to call back and discuss changes being made to the care plan.  Jay Stephens is now DNR/comfort care only  Hematological Anemia: comfort care only,no  Longer checking CBC  Neurological AMS with R-sided facial droop and weakness:comfort care only  Fall risk: lowered bed w/ mitts and safety matts, nursing is watching carefully  Dermatologic:  Sacral ulcer: -foam/turning Q2 per nursing protocol  Pain control: -tylenol prn, can do rectal if needed  FEN/GI:NPO Prophylaxis:Lovenox  Disposition:Potential snf vs hospice placement  Subjective:  Jay Stephens was less interactive than in the past but said he was not in pain, only said he was tired  Objective: Temp:  [97.4 F (36.3 C)-97.6 F (36.4 C)] 97.6 F (36.4 C) (08/20 0001) Pulse Rate:  [84-85] 85 (08/20 0001) Resp:  [18] 18 (08/19 0842) BP: (132-148)/(94-104) 148/94 (08/20 0001) SpO2:  [94 %-98 %] 94 % (08/20 0001) Physical Exam: General: frail, laying in bed, in NAD, unchanged from yesterday ENTM: mucous membranes and lips seem dry Cardiovascular: RRR, systolicmurmur, no pitting edema in bilateral ankles Respiratory: slightly increased respiratory rate, decreased breath sounds on R w/ rhonchi present, left lower lobe rhonci, left uper lobe CTA Gastrointestinal:nontender/soft belly MSK: thin extremities Neuro: arousable but appears drowsy bordering on lethargic, not as able to converse as prior exams, clearly declining  Laboratory:  Recent Labs Lab 02/02/17 0413 02/03/17 0304 02/06/17 0347  WBC 6.7 5.8 5.2  HGB 9.8* 9.2* 9.6*  HCT 32.0* 30.9* 33.8*  PLT 229 228 165    Recent Labs Lab 02/04/17 0405 02/05/17 0333 02/06/17 0347  NA 140 156* 156*  K 3.9 3.4* 3.2*  CL 104 117* 117*  CO2 27 26 26   BUN 7 35* 30*  CREATININE 0.68 1.76* 1.55*  CALCIUM 8.2* 8.4*  8.3*  GLUCOSE 120* 125* 128*      Imaging/Diagnostic Tests: Ct Chest W Contrast  Result Date: 01/22/2017 CLINICAL DATA:  Status post chest tube for right empyema. EXAM: CT CHEST WITH CONTRAST TECHNIQUE: Multidetector CT imaging of the chest was performed during intravenous contrast administration. CONTRAST:  75mL ISOVUE-300 IOPAMIDOL (ISOVUE-300) INJECTION 61% COMPARISON:  Chest CT dated 01/16/2017. Chest x-ray from earlier same day. FINDINGS: Cardiovascular: Heart size is normal. No pericardial effusion. Coronary artery calcifications. No thoracic aortic aneurysm or dissection. Mediastinum/Nodes: Scattered small and moderate-sized lymph nodes within the mediastinum and perihilar regions, likely reactive in nature. Trachea and central bronchi are unremarkable. Lungs/Pleura: Large complex collections are seen throughout the right chest, major components probably contiguous but also likely multiloculated to some degree. Dominant collection of fluid and air along the posterior aspects of the right upper lobe and right lower lobe measures over 23 cm craniocaudal dimension (series 7, image 66) and approximately 12 x 7 cm transverse by AP dimensions near the right lung base (series 7, image 61; series 3, image 126). The posterior collection appears contiguous with an additional large complex collection of fluid and air at the right lung apex (probable connection seen on series 3, image 52). The complex collection in the right lung apex measures 14 cm craniocaudal dimension and 5 cm thickness (series 7, image 54; series 3, image 63). Lastly, additional complex fluid collection is seen along the right lateral chest wall  measuring 9 cm AP dimension and 2.5 cm thickness (series 3, image 80). This collection does appear contiguous with the collection at the right lung apex. Moderate-size layering pleural effusion on the left with adjacent compressive atelectasis. Upper Abdomen: Free fluid/ascites within the upper  abdomen, at least a moderate amount, incompletely imaged. Musculoskeletal: No acute or suspicious osseous finding. Right-sided chest tube seen on CT of 01/16/2017 has been removed. IMPRESSION: 1. Large complex collections of fluid and air throughout the right chest, locations and measurements given above, largest collection along the posterior right chest measuring over 20 cm greatest dimension, presumed empyemas. These findings represent a significant worsening compared to the earlier chest CT of 01/16/2017. These collections appear to be contiguous but are also likely multiloculated to some degree. Right-sided chest tube has been removed in the interval. 2. Moderate-sized left pleural effusion. 3. Mediastinal and perihilar lymphadenopathy is likely reactive in nature. 4. Upper abdominal ascites, incompletely imaged. These results were called by telephone at the time of interpretation on 01/22/2017 at 11:00 am to Dr. Acquanetta Belling , who verbally acknowledged these results. Electronically Signed   By: Bary Richard M.D.   On: 01/22/2017 11:17   Dg Chest Port 1 View  Result Date: 01/22/2017 CLINICAL DATA:  Empyema.  Status post chest tube placement. EXAM: PORTABLE CHEST 1 VIEW COMPARISON:  CT chest 0 8/0 scratch the CT chest earlier today. Single-view of the chest 01/24/2017. FINDINGS: A new right chest tube is in place. Loculated pleural fluid collections in the right chest do not appear changed. Airspace disease in the right lung is also not notably changed. The left lung is expanded and clear. Left pleural effusion is noted. IMPRESSION: No marked change in loculated right pleural effusions and airspace disease with a new chest tube in place. Negative for pneumothorax. Small left pleural effusion seen on prior CT scan is not well demonstrated on this exam. Electronically Signed   By: Drusilla Kanner M.D.   On: 01/22/2017 15:51   Dg Chest Portable 1 View  Result Date: 02/01/2017 CLINICAL DATA:  Shortness of  breath.  History of empyema. EXAM: PORTABLE CHEST 1 VIEW COMPARISON:  PA and lateral chest 30-Jan-2017 and 01/18/2017. CT chest 01/16/2017. FINDINGS: Pleural effusion which appears loculated along the right chest wall is again seen. Right basilar effusion is new since the most recent examination. There is new airspace disease throughout the right chest. The left lung is clear. Heart size is normal. IMPRESSION: New airspace disease throughout the right chest with a new right basilar effusion worrisome for pneumonia. Loculated right pleural effusion does not appear notably changed since the most recent exam. Electronically Signed   By: Drusilla Kanner M.D.   On: 01/20/2017 20:03     Marthenia Rolling, DO 02/02/2017, 4:56 AM PGY-1, [facility] FPTS Intern pager: [phone], text pages welcome

## 2017-02-19 NOTE — Progress Notes (Signed)
  Speech Language Pathology Treatment:    Patient Details Name: Jay Stephens MRN: [id number] DOB: 14-Apr-1946 Today's Date: 01/29/2017 Time:  -       MD, since pt is now comfort care, will you order a Dys 1 (puree) diet and thin liquids for comfort feeds?    Royce Macadamia 02/17/2017, 9:09 AM  Breck Coons Lonell Face.Ed [employer] [phone]

## 2017-02-19 NOTE — Progress Notes (Signed)
Called into room by nurse tech to assess patient. Patient was found unresponsive and not breathing at this time. This RN and Baxter Hire, RN listened for heart sounds with stethoscope for 60 seconds per protocol. Time of death 45. Primary RN and MD notified.

## 2017-02-19 NOTE — Care Management Important Message (Signed)
Important Message  Patient Details  Name: Jay Stephens MRN: [id number] Date of Birth: 1945/08/01   Medicare Important Message Given:  Yes    Kyla Balzarine 02/18/2017, 11:21 AM

## 2017-02-19 NOTE — Discharge Summary (Signed)
Family Medicine Teaching Service Death Summary  Patient name: Jay Stephens Medical record number: [id number] Date of birth: Oct 14, 1945 Age: 71 y.o. Gender: male Date of Admission: 02/17/2017  Date of Discharge: 17-Feb-2017 Admitting Physician: Leighton Roach McDiarmid, MD  Primary Care Provider: System, Pcp Not In Consultants: ccm, palliative, ethics, cardiology, SLP   Indication for Hospitalization: AMS/dyspnea/cachexia  Problem List at time of death:  Right lung empyema s/p chest tube Pleural effusion AKI Dementia dysphagia Cachexia Chronic Right sided facial droop with eye deviation  Brief Hospital Course:  Jay Stephens a 35 y.o.malewith PMH is significant for right sided empyema s/p moderately succesful drainage via chest tube, altered mental status, R facial droop, tooth abscess s/p extraction and I&D, protein-calorie malnutrition, electrolyte abnormalities and generalized weakness admitted with acute resp failure from Strep pneumonia and re accumulation of R empyema. Intubated on 8/5 - 8/9. Rapid response 8/10 for resp. distress w/CCM and Cards called, no longer in respiratory distress. High troponins from 8/11-8/14with oxygen demand and increased altered mental status. Cortrack placed 8/13 and pulled by patient on 8/14. Patient refused replacing the cortrak bedside on 8/15 and an ethics consult was placed to discuss ethical considerations of forcing a feeding tube. Ethics committee determined that the  Patient's nephew is not official HCPOA and medical team was ethically and medically accurate to operate under Futility principles. The decision was made by medical team to not physically force feeding tube on patient.  Medical team attempted to meet with family to discuss intentions to place patient on DNR/DNI but family did not attend the scheduled meeting and did not respond to a phone message to alert them of changes.   Jay Stephens was made DNR/DNI out of a desire to not cause him harm and to  not prolong his suffering and inevitable decline.  On 8/20 Jay Stephens was found unresponsive by his nurse who called a second nurse to verify he had died while the team physician was notified.   Jay Stephens death was confirmed and his family was notified by phone.   They stated their intentions to come to the hospital later that day and see Jay Stephens body.   Significant Procedures: R sided chest tube, dental abscess (upper midline) I/D, cortrak placement with patient subsequently pulling it out, intubated twice in ICU with patient self-extubating  Significant Labs and Imaging:   Recent Labs Lab 02/02/17 0413 02/03/17 0304 02/06/17 0347  WBC 6.7 5.8 5.2  HGB 9.8* 9.2* 9.6*  HCT 32.0* 30.9* 33.8*  PLT 229 228 165    Recent Labs Lab 02/01/17 0832  02/02/17 0413 02/03/17 0304 02/04/17 0405 02/05/17 0333 02/06/17 0347  NA  --   < > 151* 152* 140 156* 156*  K  --   < > 4.1 3.6 3.9 3.4* 3.2*  CL  --   < > 109 113* 104 117* 117*  CO2  --   < > 27 23 27 26 26   GLUCOSE  --   < > 117* 127* 120* 125* 128*  BUN  --   < > 27* 33* 7 35* 30*  CREATININE  --   < > 1.77* 1.82* 0.68 1.76* 1.55*  CALCIUM  --   < > 8.3* 8.1* 8.2* 8.4* 8.3*  MG 1.7  --  1.8 1.8  --   --   --   PHOS 3.3  --  3.6 3.8  --   --   --   < > = values in this interval not  displayed.  Ct Chest W Contrast  Result Date: 01/22/2017 CLINICAL DATA:  Status post chest tube for right empyema. EXAM: CT CHEST WITH CONTRAST TECHNIQUE: Multidetector CT imaging of the chest was performed during intravenous contrast administration. CONTRAST:  66mL ISOVUE-300 IOPAMIDOL (ISOVUE-300) INJECTION 61% COMPARISON:  Chest CT dated 01/16/2017. Chest x-ray from earlier same day. FINDINGS: Cardiovascular: Heart size is normal. No pericardial effusion. Coronary artery calcifications. No thoracic aortic aneurysm or dissection. Mediastinum/Nodes: Scattered small and moderate-sized lymph nodes within the mediastinum and perihilar regions, likely reactive  in nature. Trachea and central bronchi are unremarkable. Lungs/Pleura: Large complex collections are seen throughout the right chest, major components probably contiguous but also likely multiloculated to some degree. Dominant collection of fluid and air along the posterior aspects of the right upper lobe and right lower lobe measures over 23 cm craniocaudal dimension (series 7, image 66) and approximately 12 x 7 cm transverse by AP dimensions near the right lung base (series 7, image 61; series 3, image 126). The posterior collection appears contiguous with an additional large complex collection of fluid and air at the right lung apex (probable connection seen on series 3, image 52). The complex collection in the right lung apex measures 14 cm craniocaudal dimension and 5 cm thickness (series 7, image 54; series 3, image 63). Lastly, additional complex fluid collection is seen along the right lateral chest wall measuring 9 cm AP dimension and 2.5 cm thickness (series 3, image 80). This collection does appear contiguous with the collection at the right lung apex. Moderate-size layering pleural effusion on the left with adjacent compressive atelectasis. Upper Abdomen: Free fluid/ascites within the upper abdomen, at least a moderate amount, incompletely imaged. Musculoskeletal: No acute or suspicious osseous finding. Right-sided chest tube seen on CT of 01/16/2017 has been removed. IMPRESSION: 1. Large complex collections of fluid and air throughout the right chest, locations and measurements given above, largest collection along the posterior right chest measuring over 20 cm greatest dimension, presumed empyemas. These findings represent a significant worsening compared to the earlier chest CT of 01/16/2017. These collections appear to be contiguous but are also likely multiloculated to some degree. Right-sided chest tube has been removed in the interval. 2. Moderate-sized left pleural effusion. 3. Mediastinal and  perihilar lymphadenopathy is likely reactive in nature. 4. Upper abdominal ascites, incompletely imaged. These results were called by telephone at the time of interpretation on 01/22/2017 at 11:00 am to Dr. Acquanetta Belling , who verbally acknowledged these results. Electronically Signed   By: Bary Richard M.D.   On: 01/22/2017 11:17   Dg Chest Port 1 View  Result Date: 01/22/2017 CLINICAL DATA:  Empyema.  Status post chest tube placement. EXAM: PORTABLE CHEST 1 VIEW COMPARISON:  CT chest 0 8/0 scratch the CT chest earlier today. Single-view of the chest 02/12/2017. FINDINGS: A new right chest tube is in place. Loculated pleural fluid collections in the right chest do not appear changed. Airspace disease in the right lung is also not notably changed. The left lung is expanded and clear. Left pleural effusion is noted. IMPRESSION: No marked change in loculated right pleural effusions and airspace disease with a new chest tube in place. Negative for pneumothorax. Small left pleural effusion seen on prior CT scan is not well demonstrated on this exam. Electronically Signed   By: Drusilla Kanner M.D.   On: 01/22/2017 15:51   Dg Chest Portable 1 View  Result Date: 02/12/2017 CLINICAL DATA:  Shortness of breath.  History of  empyema. EXAM: PORTABLE CHEST 1 VIEW COMPARISON:  PA and lateral chest 02/09/2017 and 01/18/2017. CT chest 01/16/2017. FINDINGS: Pleural effusion which appears loculated along the right chest wall is again seen. Right basilar effusion is new since the most recent examination. There is new airspace disease throughout the right chest. The left lung is clear. Heart size is normal. IMPRESSION: New airspace disease throughout the right chest with a new right basilar effusion worrisome for pneumonia. Loculated right pleural effusion does not appear notably changed since the most recent exam. Electronically Signed   By: Drusilla Kanner M.D.   On: 01/19/2017 20:03    Results/Tests Pending at Time of  Death : none     Marthenia Rolling, DO 02/06/2017, 8:20 AM PGY-1, [facility]

## 2017-02-19 NOTE — Progress Notes (Addendum)
Spoke to [employer] on the phone and informed him that his uncle, Canek Kleiser had died. Alinda Money gets off work at [employer] and then will head to the hospital to see the body.   Thomes Dinning, MD, MS FAMILY MEDICINE RESIDENT - PGY1 01/24/2017 1:24 PM

## 2017-02-19 DEATH — deceased

## 2017-03-21 DEATH — deceased

## 2018-05-24 IMAGING — DX DG CHEST 1V PORT
1 series · 1 of 1 positions shown · non-contrast
Comparison: CT chest [DATE] scratch the CT chest earlier today.
Single-view of the chest 01/21/2017.

CLINICAL DATA: Empyema.  Status post chest tube placement.

EXAM:
PORTABLE CHEST 1 VIEW

[chest ap]
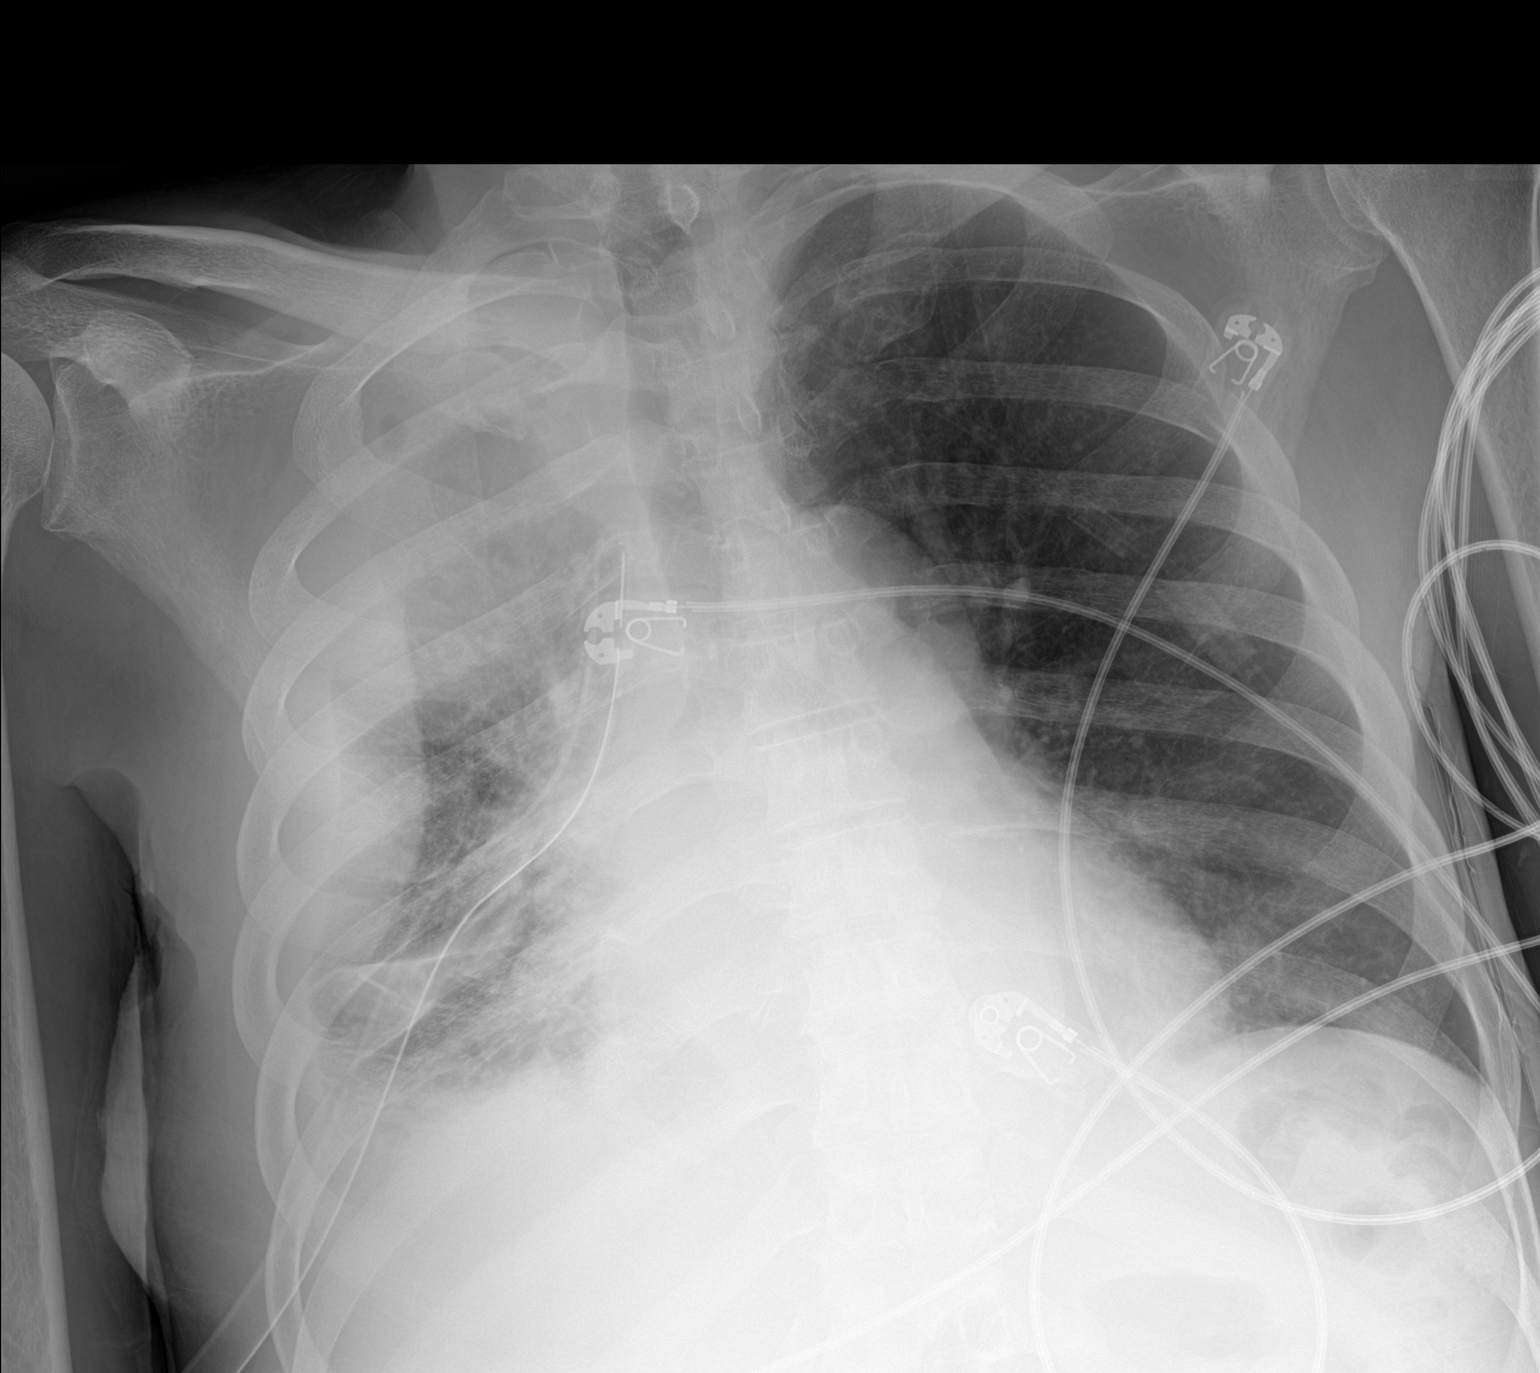

[1 of 1 positions shown; findings below may reference images not displayed]

FINDINGS: A new right chest tube is in place. Loculated pleural fluid
collections in the right chest do not appear changed. Airspace
disease in the right lung is also not notably changed. The left lung
is expanded and clear. Left pleural effusion is noted.
IMPRESSION: No marked change in loculated right pleural effusions and airspace
disease with a new chest tube in place. Negative for pneumothorax.

Small left pleural effusion seen on prior CT scan is not well
demonstrated on this exam.

## 2018-05-25 IMAGING — DX DG CHEST 1V PORT
1 series · 2 of 2 positions shown · non-contrast
Comparison: Chest x-ray from earlier same day.

CLINICAL DATA: Hypoxia, blood in airway, post tube placement.
Images viewed by Doctor and ok'd

EXAM:
PORTABLE CHEST 1 VIEW

[Series 1: chest ap · 0.14mm/px · 2 of 2 slices shown]
[im 1/2]
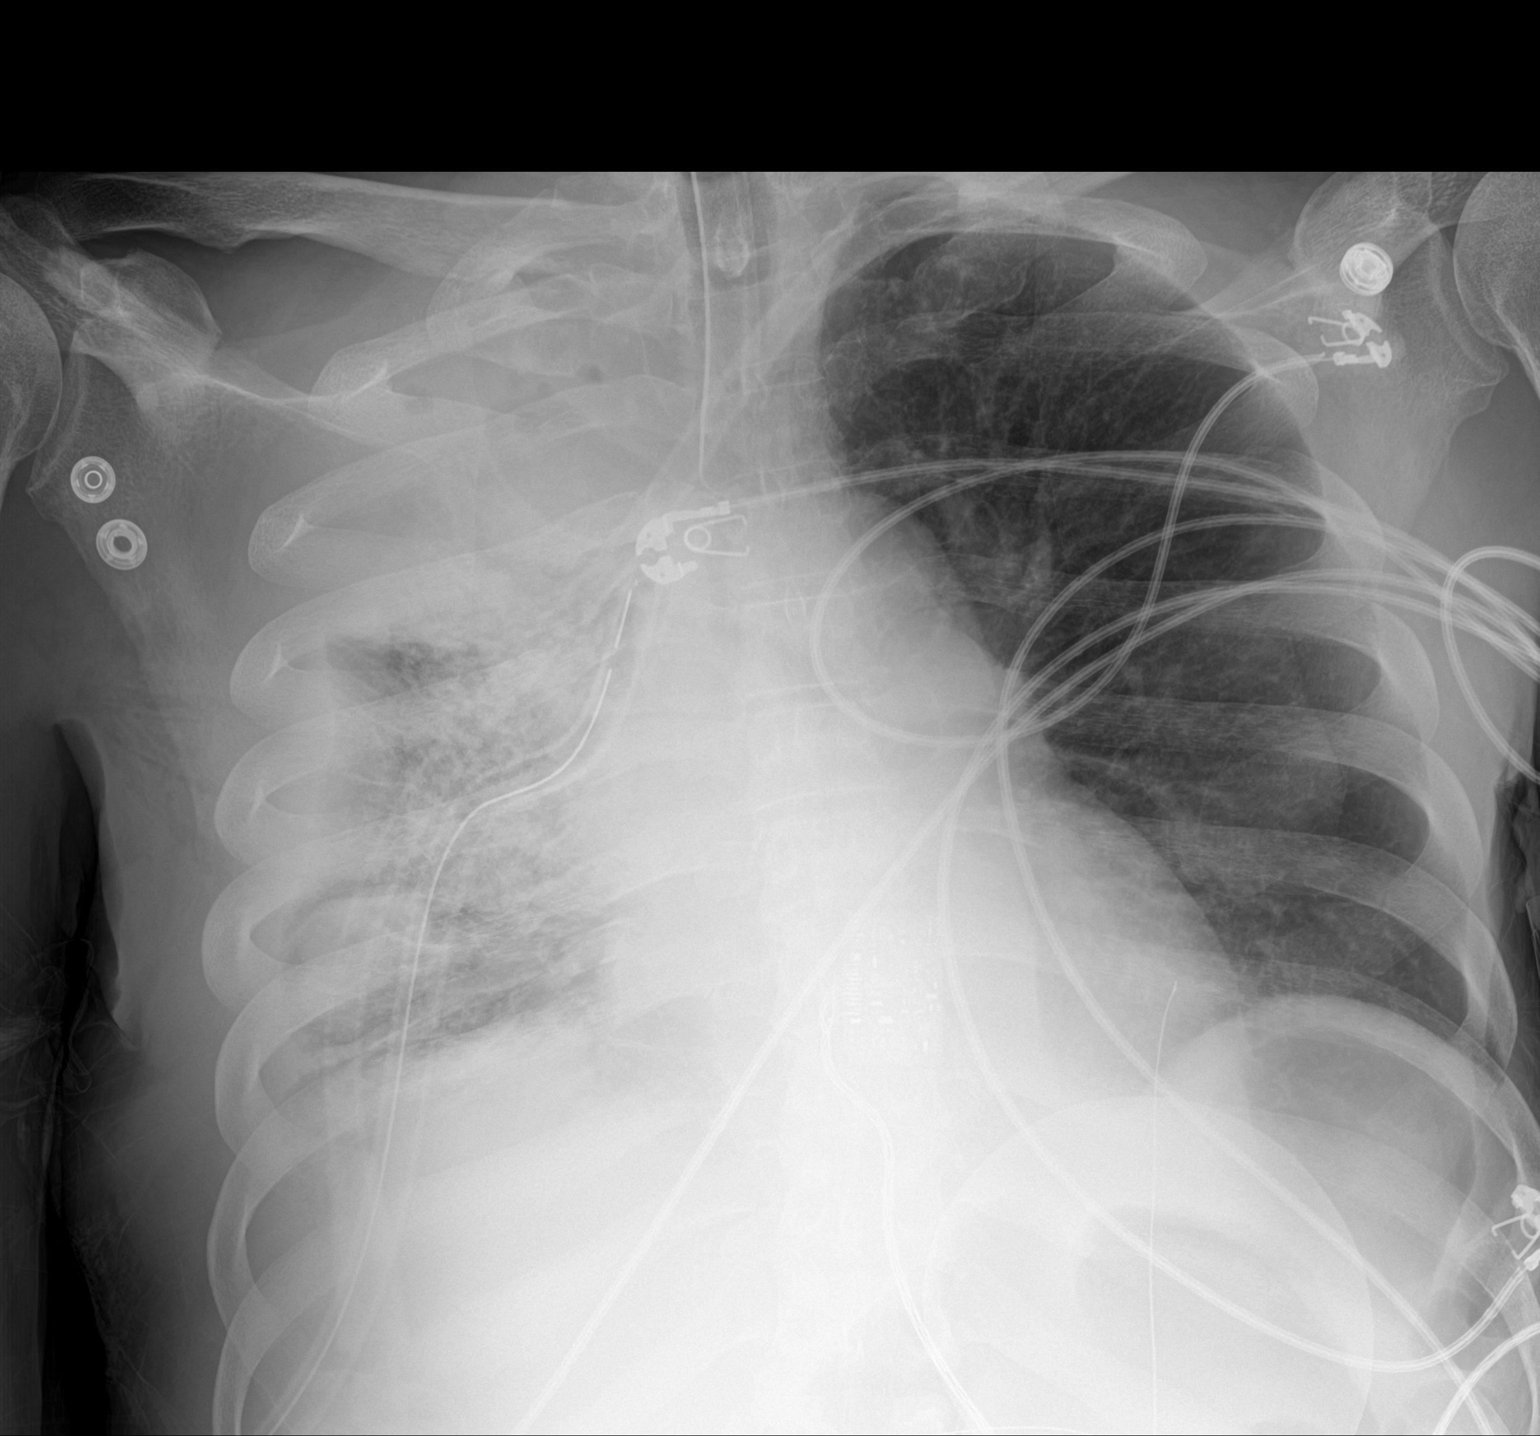
[im 2/2]
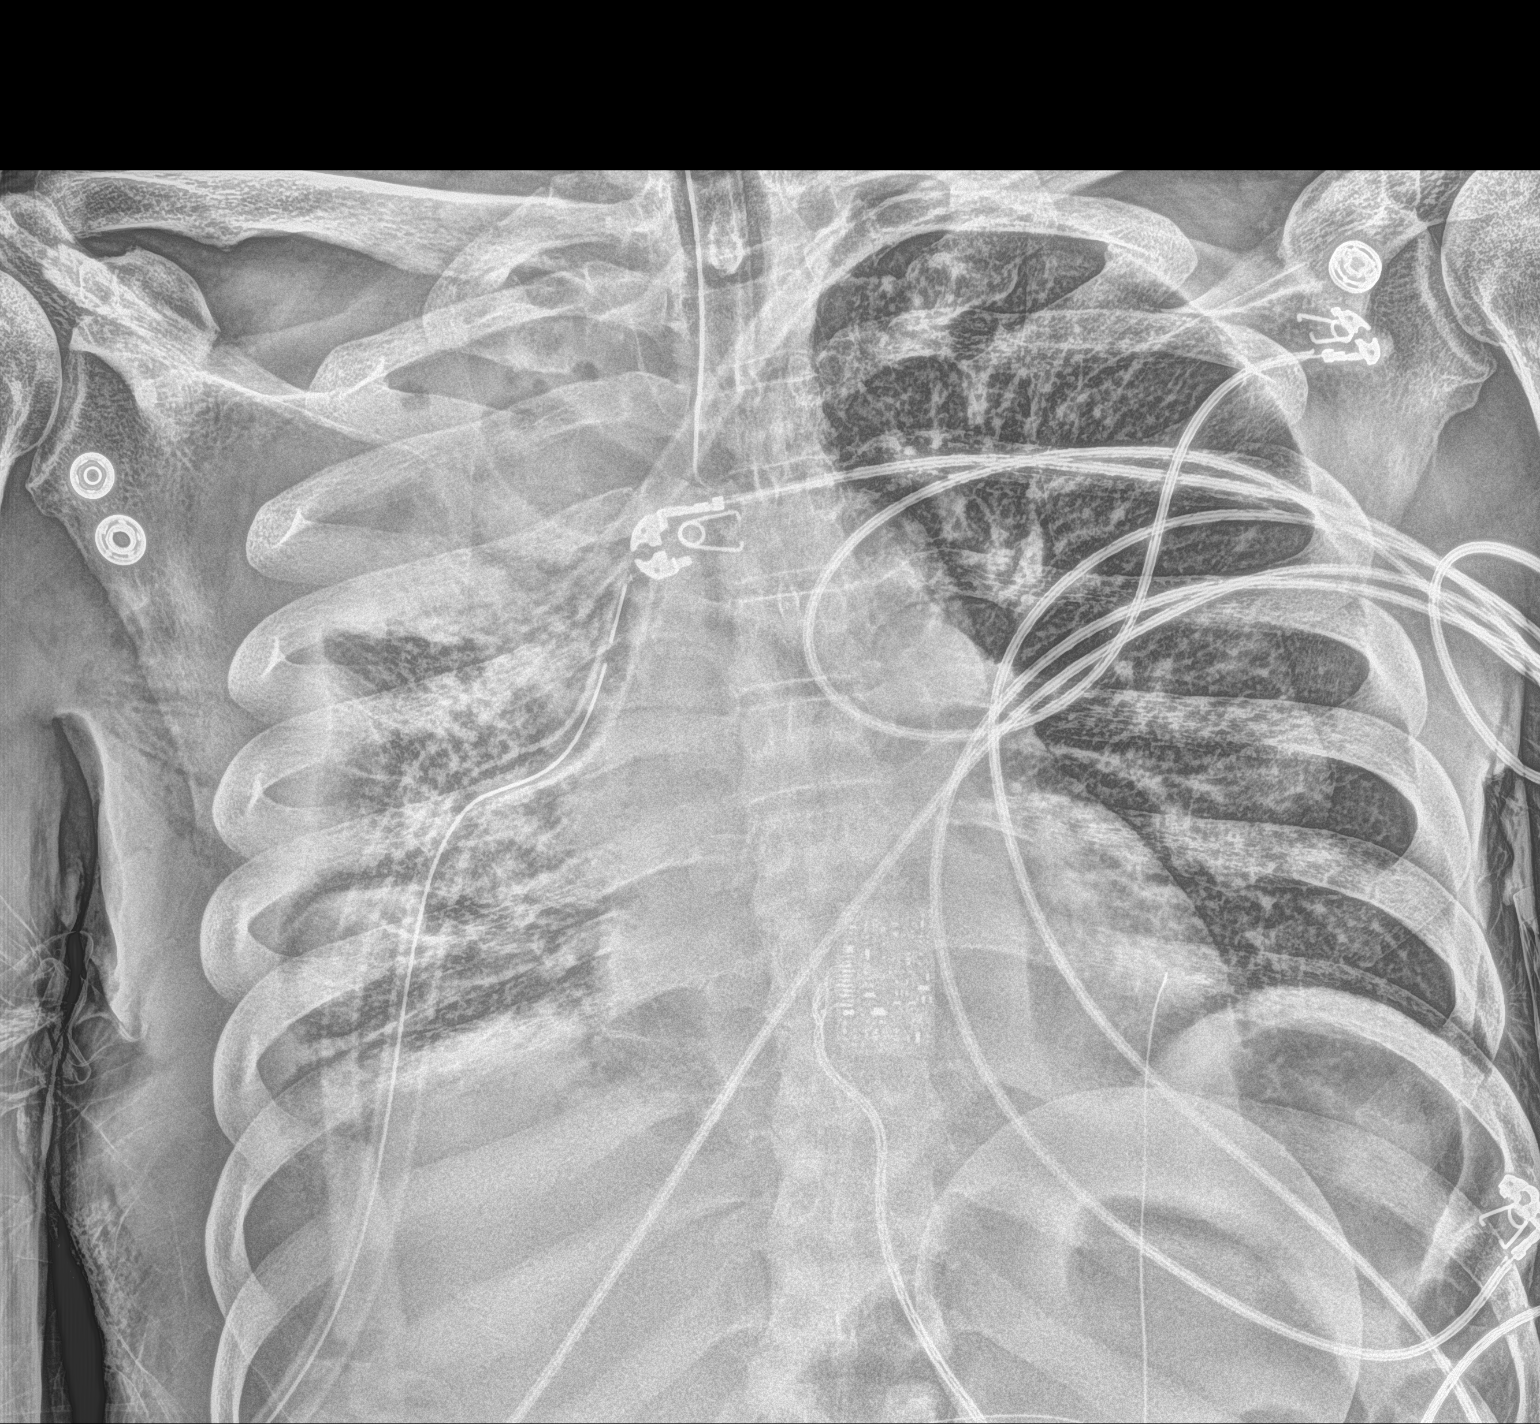

[2 of 2 positions shown; findings below may reference images not displayed]

FINDINGS: Endotracheal tube has been placed with tip well positioned just
above the level of the carina. Right-sided chest tube is stable in
position with tip directed towards the medial upper lobe.

Right lung opacities are stable. Left lung remains clear.
Cardiomediastinal silhouette appears grossly stable in size and
configuration.
IMPRESSION: 1. Endotracheal tube well positioned with tip just above the level
of the carina.
2. Right lung opacities are unchanged in the short-term interval,
compatible with the multiple presumed empyemas demonstrated on chest
CT of 01/22/2017. Right-sided chest tube is stable in position.

## 2018-05-25 IMAGING — DX DG CHEST 1V PORT
1 series · 1 of 1 positions shown · non-contrast
Comparison: 01/23/2017 at 8387 hours

CLINICAL DATA: Right-sided chest tube.  Respiratory failure.

EXAM:
PORTABLE CHEST 1 VIEW

[chest ap]
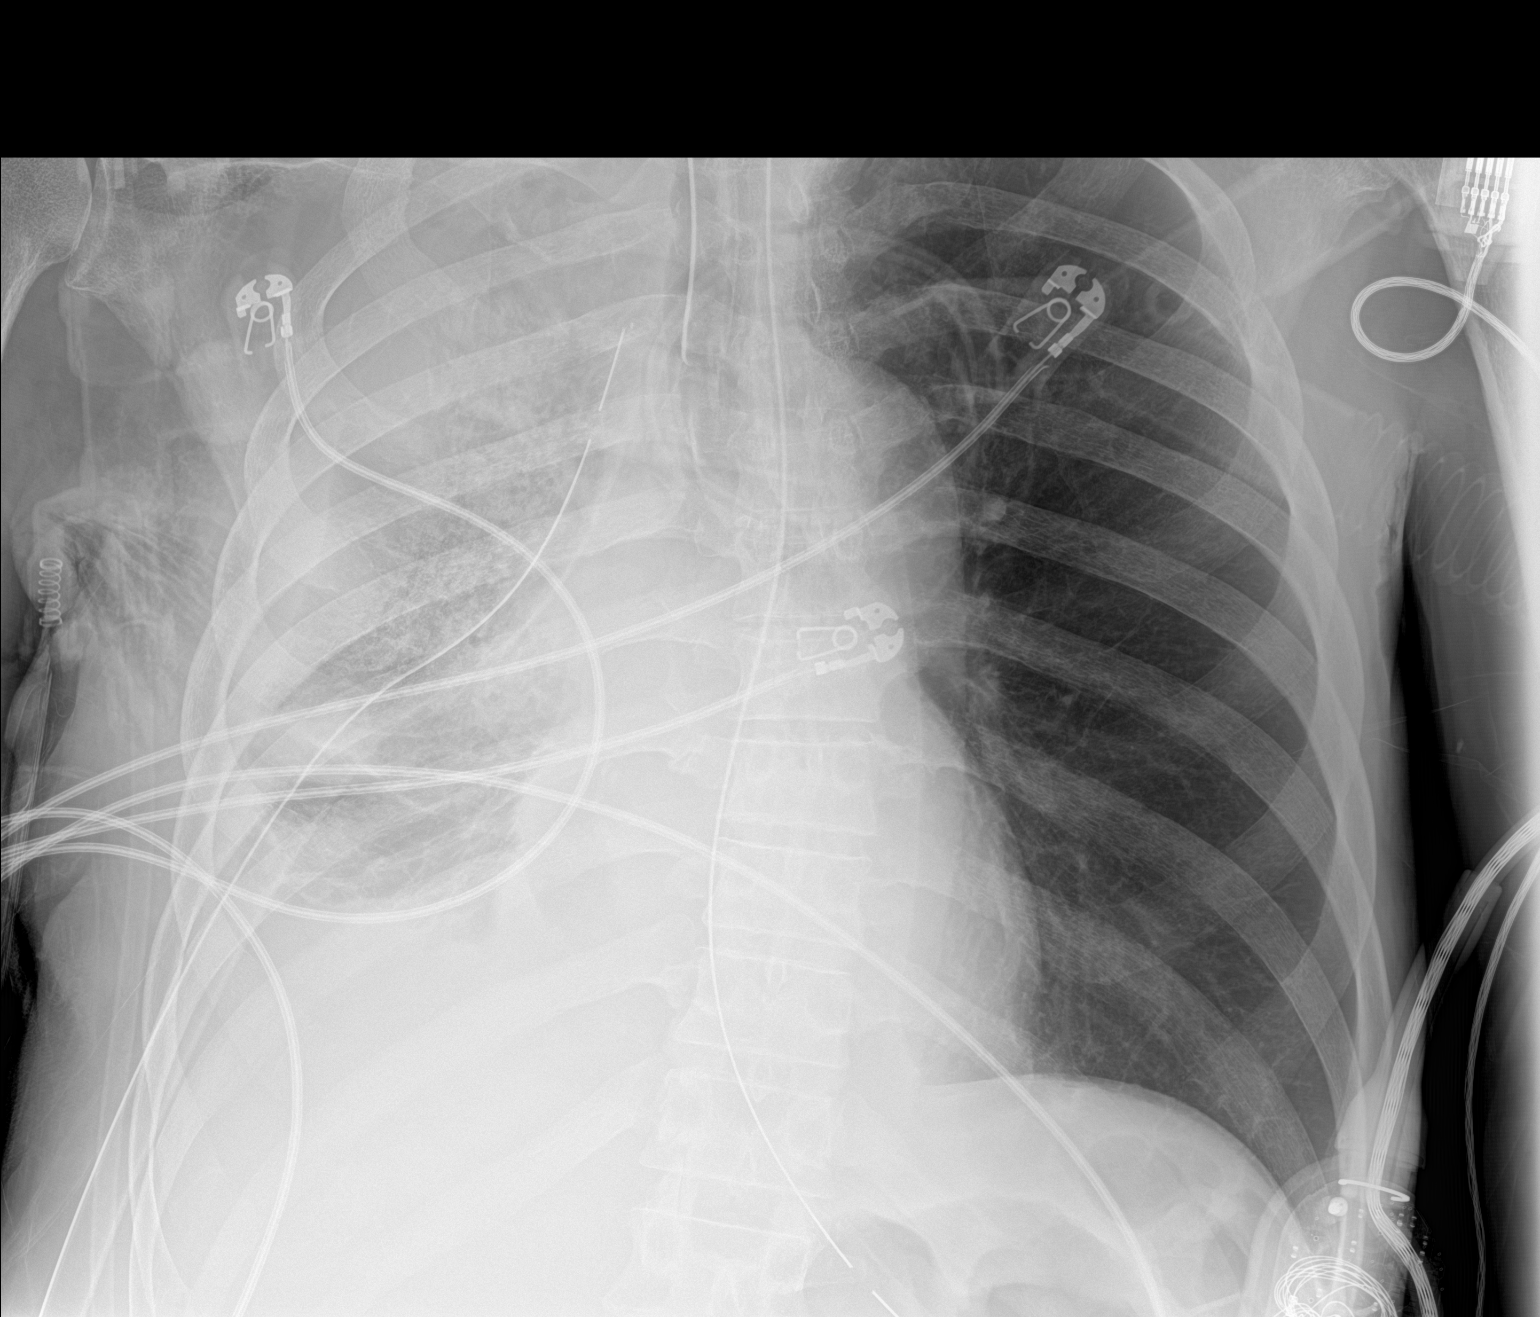

[1 of 1 positions shown; findings below may reference images not displayed]

FINDINGS: The tip of an endotracheal tube is 4.4 cm above the carina in
satisfactory position. Right-sided chest tube tip projects up to the
posterior right fifth rib level with side port at the seventh rib
level. Slight improvement in aeration of the right upper lobe since
prior. Otherwise, confluent peripheral airspace opacities persist
along the right hemithorax. Compensatory hyperinflation of the left
lung. The included heart and mediastinal contours are stable.
IMPRESSION: Some interval decrease in presumed empyema at the right lung apex
with right-sided chest tube in place. Satisfactory endotracheal tube
position.

## 2018-05-25 IMAGING — DX DG ABD PORTABLE 1V
1 series · 1 of 1 positions shown · non-contrast
Comparison: 01/23/2017 chest radiograph

CLINICAL DATA: 70 y/o  M; enteric tube placement.

EXAM:
PORTABLE ABDOMEN - 1 VIEW

[abdomen]
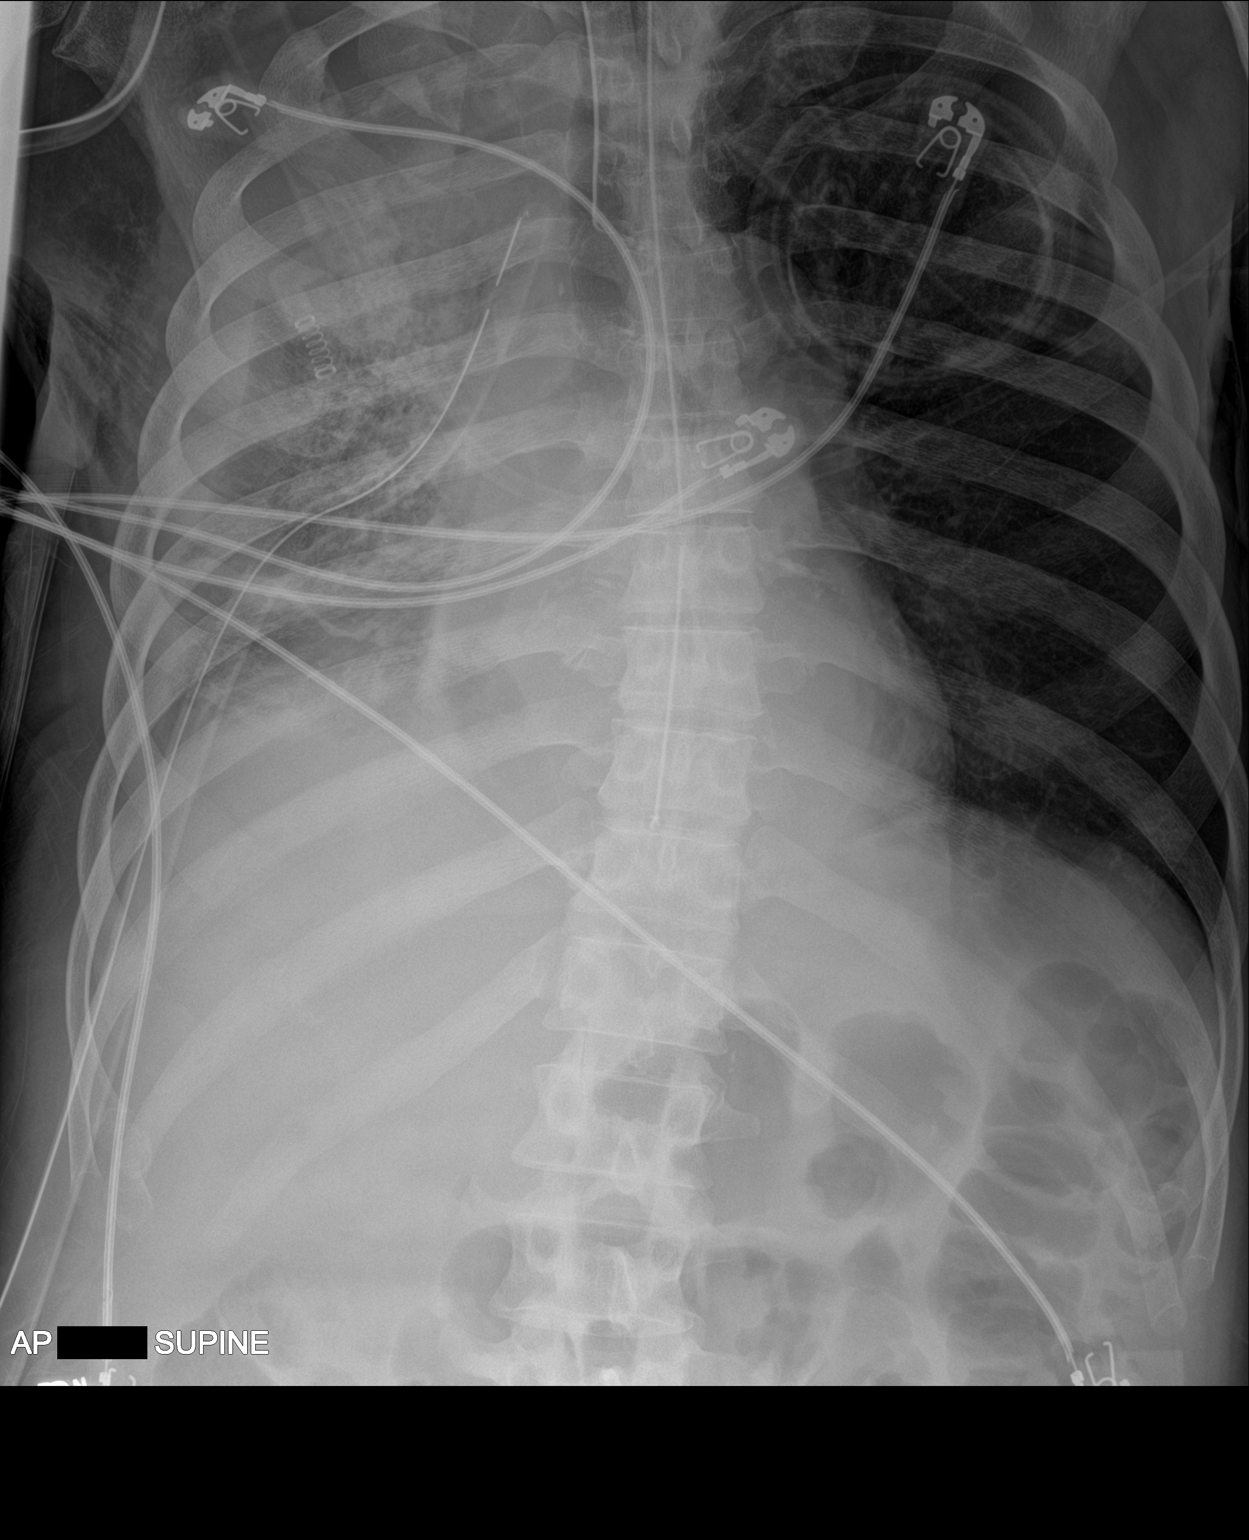

[1 of 1 positions shown; findings below may reference images not displayed]

FINDINGS: The enteric tube tip projects over the lower esophagus, advancement
is recommended. Endotracheal tube is 4.3 cm from carina. Right-sided
chest tubes. Right pleural effusion and opacification of right lung.
Partially visualized bowel gas pattern is normal.
IMPRESSION: Enteric tube tip projects over distal esophagus, advancement is
recommended.

These results will be called to the ordering clinician or
representative by the Radiologist Assistant, and communication
documented in the PACS or zVision Dashboard.

By: Silgen Apartament M.D.

## 2018-05-30 IMAGING — CR DG CHEST 1V
1 series · 1 of 1 positions shown · non-contrast
Comparison: Chest radiograph performed 01/27/2017

CLINICAL DATA: Acute onset of shortness of breath. Extubated 2 days
ago, and removal of chest tube 6 days ago. Initial encounter.

EXAM:
CHEST 1 VIEW

[AP]
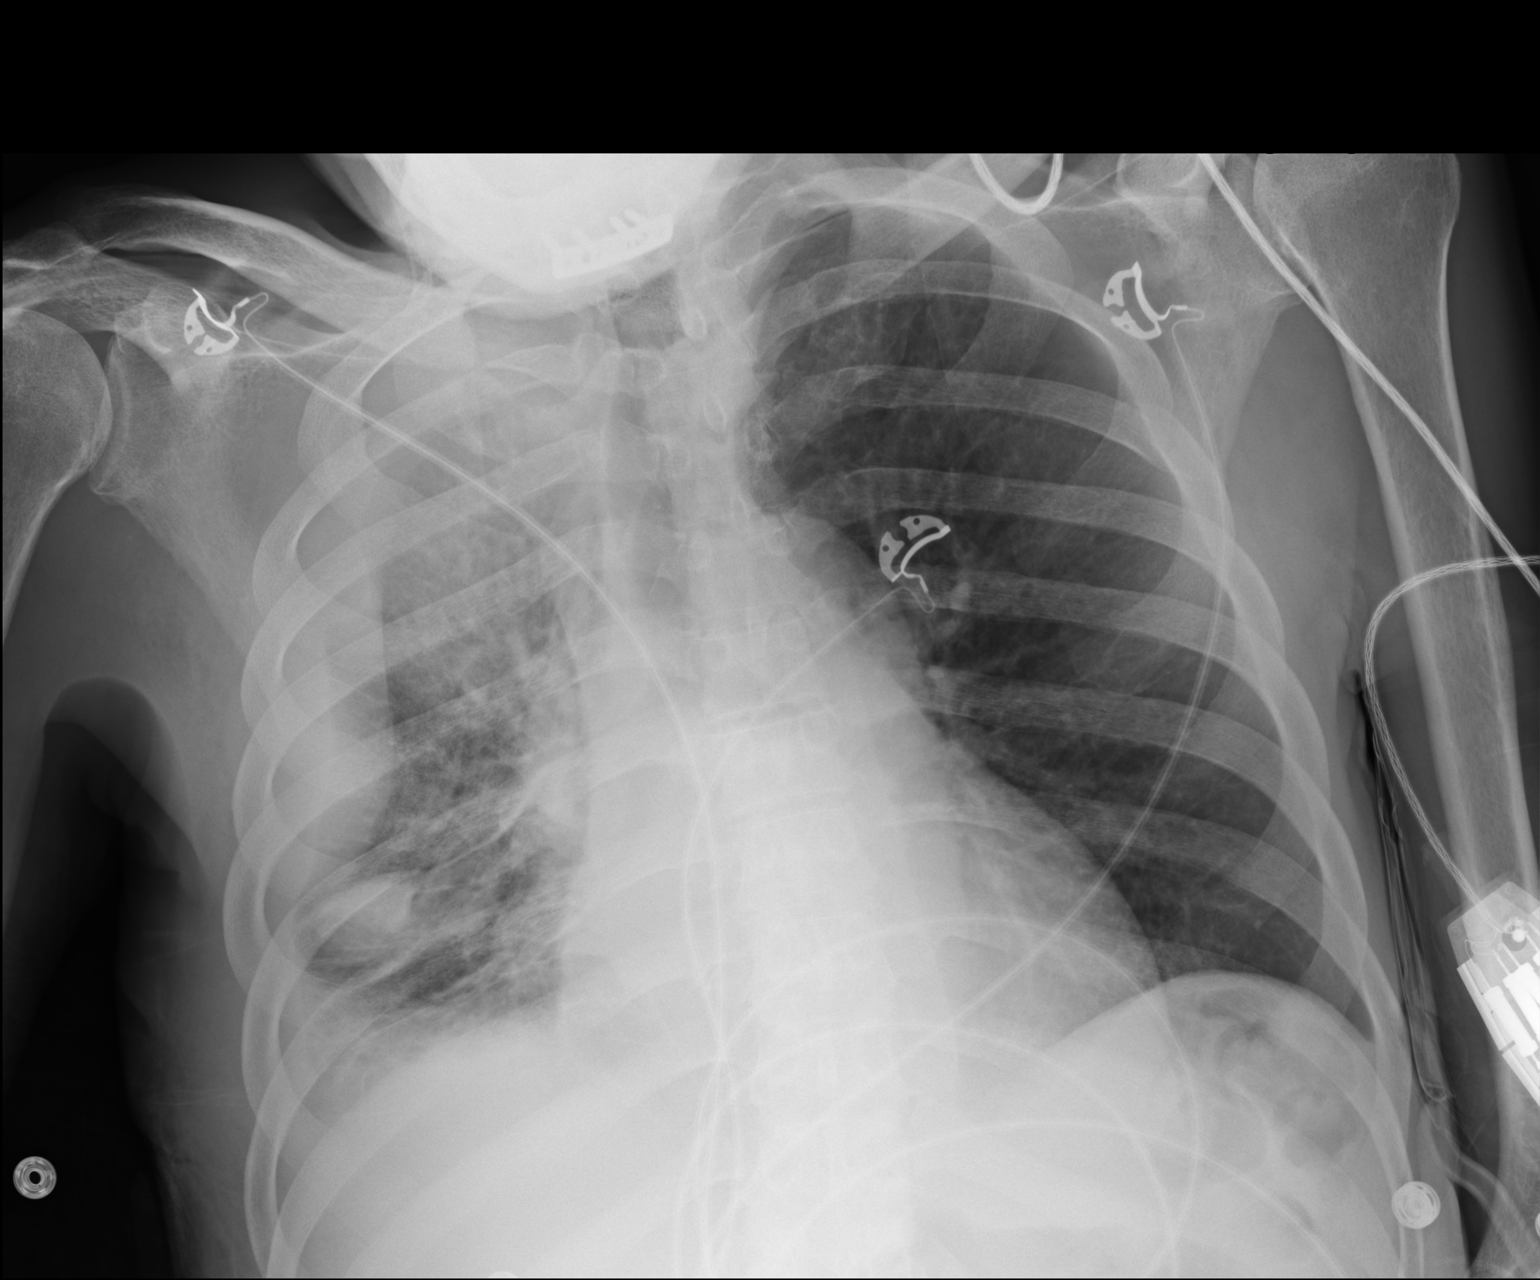

[1 of 1 positions shown; findings below may reference images not displayed]

FINDINGS: There is right-sided volume loss, with a persistent loculated
right-sided pleural effusion. As before, this is concerning for
empyema. Patchy right-sided airspace opacities are concerning for
pneumonia. The left lung appears clear. No pneumothorax is
identified.

The cardiomediastinal silhouette is borderline normal in size. No
acute osseous abnormalities are identified.
IMPRESSION: Right-sided volume loss, with persistent loculated right-sided
pleural effusion. As before, this is concerning for empyema.
Slightly improved right-sided airspace opacities are concerning for
pneumonia.
